# Patient Record
Sex: Female | Born: 1950 | Race: White | Hispanic: No | Smoking: Former smoker
Health system: Southern US, Community
[De-identification: ages and names within clinical notes are randomized; demographics above are authoritative.]

## PROBLEM LIST (undated history)

## (undated) DIAGNOSIS — Z87442 Personal history of urinary calculi: Secondary | ICD-10-CM

## (undated) DIAGNOSIS — E039 Hypothyroidism, unspecified: Secondary | ICD-10-CM

## (undated) DIAGNOSIS — E7800 Pure hypercholesterolemia, unspecified: Secondary | ICD-10-CM

## (undated) DIAGNOSIS — Z9289 Personal history of other medical treatment: Secondary | ICD-10-CM

## (undated) DIAGNOSIS — R112 Nausea with vomiting, unspecified: Secondary | ICD-10-CM

## (undated) DIAGNOSIS — Z9889 Other specified postprocedural states: Secondary | ICD-10-CM

## (undated) DIAGNOSIS — F419 Anxiety disorder, unspecified: Secondary | ICD-10-CM

## (undated) DIAGNOSIS — I4891 Unspecified atrial fibrillation: Secondary | ICD-10-CM

## (undated) HISTORY — PX: FOOT SURGERY: SHX648

## (undated) HISTORY — PX: KNEE ARTHROSCOPY W/ MENISCAL REPAIR: SHX1877

## (undated) HISTORY — PX: CHOLECYSTECTOMY: SHX55

## (undated) HISTORY — PX: APPENDECTOMY: SHX54

## (undated) HISTORY — PX: TUBAL LIGATION: SHX77

---

## 1973-09-30 DIAGNOSIS — Z9289 Personal history of other medical treatment: Secondary | ICD-10-CM

## 1973-09-30 HISTORY — DX: Personal history of other medical treatment: Z92.89

## 2001-11-19 ENCOUNTER — Encounter: Payer: Self-pay | Admitting: Emergency Medicine

## 2001-11-19 ENCOUNTER — Inpatient Hospital Stay (HOSPITAL_COMMUNITY): Admission: AD | Admit: 2001-11-19 | Discharge: 2001-11-20 | Payer: Self-pay | Admitting: Cardiovascular Disease

## 2001-11-19 ENCOUNTER — Encounter: Payer: Self-pay | Admitting: Cardiovascular Disease

## 2002-03-08 ENCOUNTER — Other Ambulatory Visit: Admission: RE | Admit: 2002-03-08 | Discharge: 2002-03-08 | Payer: Self-pay | Admitting: Dermatology

## 2002-03-09 ENCOUNTER — Encounter: Payer: Self-pay | Admitting: Emergency Medicine

## 2002-03-09 ENCOUNTER — Encounter: Payer: Self-pay | Admitting: Family Medicine

## 2002-03-09 ENCOUNTER — Observation Stay (HOSPITAL_COMMUNITY): Admission: EM | Admit: 2002-03-09 | Discharge: 2002-03-10 | Payer: Self-pay | Admitting: Emergency Medicine

## 2002-05-07 ENCOUNTER — Ambulatory Visit (HOSPITAL_COMMUNITY): Admission: RE | Admit: 2002-05-07 | Discharge: 2002-05-07 | Payer: Self-pay | Admitting: Family Medicine

## 2002-05-07 ENCOUNTER — Encounter: Payer: Self-pay | Admitting: Family Medicine

## 2003-05-06 ENCOUNTER — Encounter: Payer: Self-pay | Admitting: Family Medicine

## 2003-05-06 ENCOUNTER — Ambulatory Visit (HOSPITAL_COMMUNITY): Admission: RE | Admit: 2003-05-06 | Discharge: 2003-05-06 | Payer: Self-pay | Admitting: Family Medicine

## 2003-10-21 ENCOUNTER — Emergency Department (HOSPITAL_COMMUNITY): Admission: EM | Admit: 2003-10-21 | Discharge: 2003-10-22 | Payer: Self-pay | Admitting: Emergency Medicine

## 2003-11-10 ENCOUNTER — Ambulatory Visit (HOSPITAL_COMMUNITY): Admission: RE | Admit: 2003-11-10 | Discharge: 2003-11-10 | Payer: Self-pay | Admitting: Family Medicine

## 2004-07-26 ENCOUNTER — Ambulatory Visit (HOSPITAL_COMMUNITY): Admission: RE | Admit: 2004-07-26 | Discharge: 2004-07-26 | Payer: Self-pay | Admitting: Family Medicine

## 2004-09-24 ENCOUNTER — Ambulatory Visit (HOSPITAL_COMMUNITY): Admission: RE | Admit: 2004-09-24 | Discharge: 2004-09-24 | Payer: Self-pay | Admitting: Family Medicine

## 2004-11-14 ENCOUNTER — Ambulatory Visit (HOSPITAL_COMMUNITY): Admission: RE | Admit: 2004-11-14 | Discharge: 2004-11-14 | Payer: Self-pay | Admitting: Family Medicine

## 2005-08-16 ENCOUNTER — Ambulatory Visit (HOSPITAL_COMMUNITY): Admission: RE | Admit: 2005-08-16 | Discharge: 2005-08-16 | Payer: Self-pay | Admitting: Family Medicine

## 2006-07-01 ENCOUNTER — Ambulatory Visit (HOSPITAL_COMMUNITY): Admission: RE | Admit: 2006-07-01 | Discharge: 2006-07-01 | Payer: Self-pay | Admitting: Family Medicine

## 2006-12-08 ENCOUNTER — Ambulatory Visit (HOSPITAL_COMMUNITY): Admission: RE | Admit: 2006-12-08 | Discharge: 2006-12-08 | Payer: Self-pay | Admitting: Family Medicine

## 2007-03-26 ENCOUNTER — Emergency Department (HOSPITAL_COMMUNITY): Admission: EM | Admit: 2007-03-26 | Discharge: 2007-03-26 | Payer: Self-pay | Admitting: Emergency Medicine

## 2007-07-23 ENCOUNTER — Ambulatory Visit (HOSPITAL_COMMUNITY): Admission: RE | Admit: 2007-07-23 | Discharge: 2007-07-23 | Payer: Self-pay | Admitting: Family Medicine

## 2007-10-23 ENCOUNTER — Emergency Department (HOSPITAL_COMMUNITY): Admission: EM | Admit: 2007-10-23 | Discharge: 2007-10-23 | Payer: Self-pay | Admitting: Emergency Medicine

## 2007-12-18 ENCOUNTER — Emergency Department (HOSPITAL_COMMUNITY): Admission: EM | Admit: 2007-12-18 | Discharge: 2007-12-18 | Payer: Self-pay | Admitting: Emergency Medicine

## 2008-03-30 ENCOUNTER — Ambulatory Visit (HOSPITAL_COMMUNITY): Admission: RE | Admit: 2008-03-30 | Discharge: 2008-03-30 | Payer: Self-pay | Admitting: Family Medicine

## 2008-08-23 ENCOUNTER — Ambulatory Visit (HOSPITAL_COMMUNITY): Admission: RE | Admit: 2008-08-23 | Discharge: 2008-08-23 | Payer: Self-pay | Admitting: Family Medicine

## 2009-02-02 ENCOUNTER — Emergency Department (HOSPITAL_COMMUNITY): Admission: EM | Admit: 2009-02-02 | Discharge: 2009-02-02 | Payer: Self-pay | Admitting: Emergency Medicine

## 2009-08-28 ENCOUNTER — Ambulatory Visit (HOSPITAL_COMMUNITY): Admission: RE | Admit: 2009-08-28 | Discharge: 2009-08-28 | Payer: Self-pay | Admitting: Family Medicine

## 2010-07-20 ENCOUNTER — Ambulatory Visit: Payer: Self-pay | Admitting: Gastroenterology

## 2010-07-20 DIAGNOSIS — F411 Generalized anxiety disorder: Secondary | ICD-10-CM | POA: Insufficient documentation

## 2010-07-20 DIAGNOSIS — K921 Melena: Secondary | ICD-10-CM | POA: Insufficient documentation

## 2010-07-20 DIAGNOSIS — F419 Anxiety disorder, unspecified: Secondary | ICD-10-CM | POA: Insufficient documentation

## 2010-08-17 ENCOUNTER — Ambulatory Visit (HOSPITAL_COMMUNITY): Admission: RE | Admit: 2010-08-17 | Discharge: 2010-08-17 | Payer: Self-pay | Admitting: Gastroenterology

## 2010-08-17 ENCOUNTER — Ambulatory Visit: Payer: Self-pay | Admitting: Gastroenterology

## 2010-08-31 ENCOUNTER — Ambulatory Visit (HOSPITAL_COMMUNITY)
Admission: RE | Admit: 2010-08-31 | Discharge: 2010-08-31 | Payer: Self-pay | Source: Home / Self Care | Admitting: Family Medicine

## 2010-10-09 ENCOUNTER — Emergency Department (HOSPITAL_COMMUNITY)
Admission: EM | Admit: 2010-10-09 | Discharge: 2010-10-09 | Payer: Self-pay | Source: Home / Self Care | Admitting: Emergency Medicine

## 2010-10-15 LAB — BASIC METABOLIC PANEL
BUN: 24 mg/dL — ABNORMAL HIGH (ref 6–23)
CO2: 26 mEq/L (ref 19–32)
Calcium: 9.8 mg/dL (ref 8.4–10.5)
Chloride: 104 mEq/L (ref 96–112)
Creatinine, Ser: 0.95 mg/dL (ref 0.4–1.2)
GFR calc Af Amer: >60 mL/min (ref >60)
GFR calc non Af Amer: >60 mL/min (ref >60)
Glucose, Bld: 104 mg/dL — ABNORMAL HIGH (ref 70–99)
Potassium: 4 mEq/L (ref 3.5–5.1)
Sodium: 140 mEq/L (ref 135–145)

## 2010-10-15 LAB — DIFFERENTIAL
Basophils Absolute: 0 10*3/uL (ref 0.0–0.1)
Basophils Relative: 0 % (ref 0–1)
Eosinophils Absolute: 0.1 10*3/uL (ref 0.0–0.7)
Eosinophils Relative: 1 % (ref 0–5)
Lymphocytes Relative: 29 % (ref 12–46)
Lymphs Abs: 2.7 10*3/uL (ref 0.7–4.0)
Monocytes Absolute: 0.8 10*3/uL (ref 0.1–1.0)
Monocytes Relative: 8 % (ref 3–12)
Neutro Abs: 5.8 10*3/uL (ref 1.7–7.7)
Neutrophils Relative %: 62 % (ref 43–77)

## 2010-10-15 LAB — CBC
HCT: 39.5 % (ref 36.0–46.0)
Hemoglobin: 13.7 g/dL (ref 12.0–15.0)
MCH: 30.5 pg (ref 26.0–34.0)
MCHC: 34.7 g/dL (ref 30.0–36.0)
MCV: 88 fL (ref 78.0–100.0)
Platelets: 296 10*3/uL (ref 150–400)
RBC: 4.49 MIL/uL (ref 3.87–5.11)
RDW: 12.3 % (ref 11.5–15.5)
WBC: 9.3 10*3/uL (ref 4.0–10.5)

## 2010-10-15 LAB — D-DIMER, QUANTITATIVE: D-Dimer, Quant: 0.22 ug/mL-FEU (ref 0.00–0.48)

## 2010-10-15 LAB — POCT CARDIAC MARKERS
CKMB, poc: 1.4 ng/mL (ref 1.0–8.0)
Myoglobin, poc: 84.9 ng/mL (ref 12–200)
Troponin i, poc: <0.05 ng/mL (ref 0.00–0.09)

## 2010-10-21 ENCOUNTER — Encounter: Payer: Self-pay | Admitting: Family Medicine

## 2010-10-30 NOTE — Letter (Signed)
Summary: TCS ORDER  TCS ORDER   Imported By: Ave Filter 07/20/2010 11:34:42  _____________________________________________________________________  External Attachment:    Type:   Image     Comment:   External Document

## 2010-11-01 NOTE — Assessment & Plan Note (Signed)
Summary: COMP EXAM NEEDS TO TALK TO YOU REG COLONOSCOPY/LAW   Visit Type:  Initial Consult Referring Julie Miles:  Julie Miles Primary Care Julie Miles:  Julie Miles  Chief Complaint:  Discuss Colonoscopy.  History of Present Illness: Julie Miles is a pleasant 60 y/o WF, patient of Dr. Phillips Miles, who presents for to discuss colonoscopy. She has never had a TCS. Overall she feels well. Occasionally she has some loose stools related to certain foods. Recently had a little brbpr on toilet tissue. No constipation, melena, brbpr, abd pain. No heartburn, n/v, gerd, no weight loss.   Current Medications (verified): 1)  Simvastatin 40 Mg Tabs (Simvastatin) .... Take 1 Tablet By Mouth Once A Day 2)  Alprazolam 1 Mg Tabs (Alprazolam) .... Take 1 Tablet By Mouth Once A Day  Allergies (verified): No Known Drug Allergies  Past History:  Past Medical History: Osteoarthritis Hyperlipidemia Anxiety No prior tcs.  Past Surgical History: Appendectomy Cholecystectomy Tubal Ligation  Family History: No FH CRC, colon polyps, liver disease.  Social History: Works at [employer]. Divorced. Two children. Quit tob 5 years ago. No alcohol.  Review of Systems General:  Denies fever, chills, sweats, anorexia, fatigue, weakness, and weight loss. Eyes:  Denies vision loss. ENT:  Denies nasal congestion, sore throat, hoarseness, and difficulty swallowing. CV:  Denies chest pains, angina, palpitations, dyspnea on exertion, and peripheral edema. Resp:  Denies dyspnea at rest, dyspnea with exercise, cough, sputum, and wheezing. GI:  See HPI. GU:  Denies urinary burning and blood in urine. MS:  Denies joint pain / LOM. Derm:  Denies rash and itching. Neuro:  Denies weakness, frequent headaches, memory loss, and confusion. Psych:  Denies depression and anxiety. Endo:  Denies unusual weight change. Heme:  Denies bruising and bleeding. Allergy:  Denies hives and rash.  Vital Signs:  Patient profile:   60 year  old female Height:      67 inches Weight:      174 pounds BMI:     27.35 Temp:     98.1 degrees F oral Pulse rate:   64 / minute BP sitting:   140 / 72  (left arm) Cuff size:   regular  Vitals Entered By: Cloria Spring LPN (July 20, 2010 10:30 AM)  Physical Exam  General:  Well developed, well nourished, no acute distress. Head:  Normocephalic and atraumatic. Eyes:  Conjunctivae pink, no scleral icterus.  Mouth:  Oropharyngeal mucosa moist, pink.  No lesions, erythema or exudate.    Neck:  Supple; no masses or thyromegaly. Lungs:  Clear throughout to auscultation. Heart:  Regular rate and rhythm; no murmurs, rubs,  or bruits. Abdomen:  Bowel sounds normal.  Abdomen is soft, nontender, nondistended.  No rebound or guarding.  No hepatosplenomegaly, masses or hernias.  No abdominal bruits.  Rectal:  deferred until time of colonoscopy.   Extremities:  No clubbing, cyanosis, edema or deformities noted. Neurologic:  Alert and  oriented x4;  grossly normal neurologically. Skin:  Intact without significant lesions or rashes. Cervical Nodes:  No significant cervical adenopathy. Psych:  Alert and cooperative. Normal mood and affect.  Impression & Recommendations:  Problem # 1:  HEMATOCHEZIA (ICD-578.1)  Occasional brbpr. Occasional pp loose stool. Suspect benign anorectal source. Never had screening colonoscopy. Colonoscopy to be performed in near future.  Risks, alternatives, and benefits including but not limited to the risk of reaction to medication, bleeding, infection, and perforation were addressed.  Patient voiced understanding and provided verbal consent.   Orders: Consultation Level III [phone])  I would like to thank Dr. Phillips Miles for allowing [place] to take part in the care of this nice patient.  Appended Document: COMP EXAM NEEDS TO TALK TO YOU REG COLONOSCOPY/LAW SEP 2011 172 LBS

## 2011-01-08 LAB — DIFFERENTIAL
Basophils Absolute: 0 10*3/uL (ref 0.0–0.1)
Basophils Relative: 0 % (ref 0–1)
Eosinophils Absolute: 0 10*3/uL (ref 0.0–0.7)
Eosinophils Relative: 0 % (ref 0–5)
Monocytes Absolute: 1 10*3/uL (ref 0.1–1.0)
Neutro Abs: 11.1 10*3/uL — ABNORMAL HIGH (ref 1.7–7.7)

## 2011-01-08 LAB — COMPREHENSIVE METABOLIC PANEL
ALT: 33 U/L (ref 0–35)
AST: 31 U/L (ref 0–37)
Albumin: 3.9 g/dL (ref 3.5–5.2)
Alkaline Phosphatase: 56 U/L (ref 39–117)
BUN: 16 mg/dL (ref 6–23)
Chloride: 101 mEq/L (ref 96–112)
Potassium: 3.3 mEq/L — ABNORMAL LOW (ref 3.5–5.1)
Total Bilirubin: 0.9 mg/dL (ref 0.3–1.2)

## 2011-01-08 LAB — CBC
HCT: 36.1 % (ref 36.0–46.0)
Platelets: 209 10*3/uL (ref 150–400)
WBC: 12.9 10*3/uL — ABNORMAL HIGH (ref 4.0–10.5)

## 2011-01-08 LAB — URINALYSIS, ROUTINE W REFLEX MICROSCOPIC
Glucose, UA: NEGATIVE mg/dL
Ketones, ur: NEGATIVE mg/dL
Protein, ur: NEGATIVE mg/dL

## 2011-01-08 LAB — URINE MICROSCOPIC-ADD ON

## 2011-01-14 ENCOUNTER — Emergency Department (HOSPITAL_COMMUNITY)
Admission: EM | Admit: 2011-01-14 | Discharge: 2011-01-14 | Disposition: A | Discharge status: Home / Self Care | Payer: PRIVATE HEALTH INSURANCE | Attending: Emergency Medicine | Admitting: Emergency Medicine

## 2011-01-14 DIAGNOSIS — Z79899 Other long term (current) drug therapy: Secondary | ICD-10-CM | POA: Insufficient documentation

## 2011-01-14 DIAGNOSIS — E785 Hyperlipidemia, unspecified: Secondary | ICD-10-CM | POA: Insufficient documentation

## 2011-01-14 DIAGNOSIS — M79609 Pain in unspecified limb: Secondary | ICD-10-CM | POA: Insufficient documentation

## 2011-01-14 DIAGNOSIS — S9030XA Contusion of unspecified foot, initial encounter: Secondary | ICD-10-CM | POA: Insufficient documentation

## 2011-01-14 DIAGNOSIS — IMO0002 Reserved for concepts with insufficient information to code with codable children: Secondary | ICD-10-CM | POA: Insufficient documentation

## 2011-01-14 DIAGNOSIS — I10 Essential (primary) hypertension: Secondary | ICD-10-CM | POA: Insufficient documentation

## 2011-02-15 NOTE — Discharge Summary (Signed)
[place]  Patient:    NGUYEN, BUTLER Visit Number: [id number] MRN: [id number]          Service Type: MED Location: [id number] Attending Physician:  Virgina Evener Dictated by:   Raymon Mutton, P.A. Admit Date:  11/19/2001 Discharge Date: 11/20/2001   CC:         Dr. Mardi Mainland, [place]  Richard A. Alanda Amass, M.D.   Discharge Summary  DATE OF BIRTH:  02-May-1951.  ADMITTING PHYSICIAN:  Lennette Bihari, M.D.  DISCHARGING PHYSICIAN:  Pearletha Furl. Alanda Amass, M.D.  DISCHARGE DIAGNOSES: 1. Pneumonia, discharged home on antibiotic therapy. 2. Chest pain somewhat atypical, could be pleuritic, transferred to [facility] to rule out myocardial infarction, ruled out by negative    cardiac enzymes and no electrocardiogram changes. 3. Anxiety. 4. Hypokalemia of unknown etiology.  HISTORY OF PRESENT ILLNESS:  The patient is a 60 year old Caucasian woman with prior history of anxiety was admitted to [facility] with complaints of chest pain.  She awakened from sleep with chest pain and mild substernal pressure and mid upper epigastric pain.  She also complained of some shortness of breath and nausea but did not have any diaphoresis and did not have any vomiting.  She had a prior episode of the same dyspnea and chest discomfort several months ago but it resolved with lying down and resting.  This time the pain was getting worse with deep breathing.  The patient did not have any prior history of diabetes mellitus, hypertension or prior history of coronary artery disease but she is a smoker and has a positive family history of coronary artery disease.  The patient was transferred to [facility] on intravenous heparin and nitroglycerin for a possible catheterization to rule out ischemia.  HOSPITAL COURSE:  Upon assessment at [facility] the patient cardiac enzymes revealed normal values.  Her  electrocardiogram did not show any changes.  She complained mostly of right sided chest pain that was pleuritic in nature and her BMP showed low potassium of 3.3 and renal function was okay. The BUN was 7 and creatinine was 0.6, glucose 106. White blood cell count was elevated at 13.1, hemoglobin 12.4 and hematocrit 36.4.  Electrocardiogram revealed normal sinus rhythm and heart rate 71 per minute.  The chest x-ray showed mild changes of bronchitis either acute or chronic and biapical pleural and parenchymal scarring.  The patient was assessed in the morning by Dr. Alanda Amass and she was found to be stable from a cardiovascular standpoint.  She was discharged home on antibiotic and nonsteroidal anti-inflammatory drugs to ease up pleuritic chest pain.  DISCHARGE MEDICATIONS: 1. ______ 400 mg one p.o. daily for 10 days. 2. Vioxx 25 mg one p.o. daily p.r.n. pain. 3. Aspirin 81 mg p.o. q.d.  ACTIVITY:  As tolerated.  DISCHARGE DIET:  Low fat, low cholesterol, low salt diet.  SPECIAL INSTRUCTIONS:  The patient was advised to continue antibiotics as prescribed.  DISCHARGE FOLLOW UP: 1. Outpatient Cardiolite stress test will be on 12/03/2001 at 11:15. 2. Follow up with Dr. Laurena Slimmer in [facility] December 11, 2001 at 10:45 a.m.  OTHER DISCHARGE RECOMMENDATIONS:  Patient is to follow up the hypokalemia and scheduled patient for outpatient adrenal magnetic resonance imaging to rule out primary aldosteronism. Dictated by:   Raymon Mutton, P.A. Attending Physician:  Virgina Evener DD:  11/20/01 TD:  11/21/01 Job: [id number]

## 2011-02-15 NOTE — Consult Note (Signed)
[place]  Patient:    Julie Miles, Julie Miles Visit Number: [id number] MRN: [id number]          Service Type: MED Location: 2A A216 01 Attending Physician:  Colette Ribas Dictated by:   Tammy Presno, P.A. Proc. Date: 03/10/02 Admit Date:  03/09/2002 Discharge Date: 03/10/2002                            Consultation Report  HISTORY OF PRESENT ILLNESS:  This is a 60 year old, right-handed, Caucasian lady who was doing quite well up until this morning.  She did go to work, and mid morning at around 1 a.m., her supervisor noticed her leaning up against a wall.  It appeared that she did not feel well.  He did go and speak with her. They went to sit down and discuss what the possibilities of the problem were. They did feel that something was definitely wrong and called the paramedics. They were talking about her going to the hospital, and she evidently stood up and said she did not want to go to the hospital.  I believe she was getting ready to leave the room.  When she did stand up, she remained unaware for approximately 10 minutes.  I did speak with her supervisor this morning who witnessed this episode.  He does report that she did not have any seizure-like activity. She did not have any oral trauma or urinary incontinence.  There was nothing out of the ordinary according to the supervisor except that she simply collapsed.  There were no extenuating circumstances or prior warning that this was going to happen except for the patient stating she was not feeling well. She evidently was not specific about what she meant about that.  She has never had an episode like this before.  She did describe actually that she was feeling lightheaded and not as though things were spinning around.  She does report realizing that she was a little more tired yesterday than usual.  She did not have any slurred speech or visual disturbances.  She did have an episode, I  believe approximately a week ago, where she had a visual disturbance where her vision became black that lasted only a couple of seconds.  There have been no more incidents similar to this.  The patient has never had any seizures or strokes before.   The patient does have rare headaches.  She does not report snoring.  PAST MEDICAL HISTORY:  The patient does not have any significant or contributory past medical history.  CURRENT MEDICATIONS:  Xanax 0.5 p.r.n.  ALLERGIES:  CODEINE causes a GI upset.  FAMILY HISTORY:  Noncontributory.  SOCIAL HISTORY:  The patient is separated with two children.  She did begin smoking at age 74 and currently smokes a pack and one-half a day.  She does not use alcohol.  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  REVIEW OF SYSTEMS:  The patient does have insomnia from time to time.  She does not have reported apnea.  She does get tired quite a bit.  She does have a history of anemia.  PHYSICAL EXAMINATION:  GENERAL:  A very pleasant, weight appropriate lady in no acute distress.  VITAL SIGNS:  Orthostatics were checked on this patient as she did collapse just as she was standing up.  Her lying blood pressure was 104/55, pulse 66; standing 103/64, pulse  82.  NEUROLOGIC:  Mental status: The patient is alert and oriented.  Speech, language, and cognition are intact.  Pupils are equally round and reactive to light and accommodation.  Extraocular movements are intact.  There are no visual field deficits.  Facial muscle strength is normal and symmetric. Tongue and uvula are both midline.  Gag response is present.  Shoulder shrugs are normal.  Motor shows normal tone, bulk, and strength.  There is no pronator drift.  Sensory is normal to pinprick and temperature.  Reflexes are 2+ and symmetric.  Toes are downturning bilaterally.  Gait and tandem walking are normal.  IMPRESSION:  Syncopal episode, most likely not  neurologic in nature.  I am unable to detect any abnormalities with her neurologic exam.  Both transient ischemic attack and seizure would be considered in the differential.  PLAN:  We will see the patient if needed in our office or if she has further complications.  We would consider at that time doing an EEG as well. Dictated by:   Tammy Presno, P.A. Attending Physician:  Colette Ribas DD:  03/10/02 TD:  03/12/02 Job: [id number]

## 2011-02-15 NOTE — H&P (Signed)
[facility]  Patient:    Julie Miles, Julie Miles Visit Number: [id number] MRN: [id number]          Service Type: MED Location: 2A A216 01 Attending Physician:  Darlin Priestly Dictated by:   Colette Ribas, M.D. Admit Date:  03/09/2002 Discharge Date: 03/10/2002                           History and Physical  ADMITTING DIAGNOSIS:  Syncope.  HISTORY OF PRESENT ILLNESS:  A 60 year old female who just had a significant past medical history of anxiety, who presented to the emergency department, status post syncopal episode at work.  She was simply sitting down at work and felt light-headed, and "passed out for a few seconds."  Did not have a head injury.  Did not have any seizure-like activity.  Did not appear to be postictal after the episode.  Has been light-headed over the past preceding months with near syncopal episodes as well.  Was recently evaluated back in March with chest pain and near syncope by Dr. Domingo Sep with [facility].  She had a negative nuclear stress test at that time. Echocardiogram overall normal.  Stress test overall normal.  The episode today was not associated with any cardiovascular symptomatology such as chest pain or shortness of breath.  PAST MEDICAL HISTORY:  Anxiety.  PAST SURGICAL HISTORY:  BTL, cholecystectomy, appendectomy.  SOCIAL HISTORY:  She smokes a pack-a-day for many years.  No alcohol use. Works with [employer].  MEDICATIONS:  Xanax 0.5 mg p.r.n.  ALLERGIES:  CODEINE causing vomiting.  FAMILY HISTORY:  Significant for hypertension, some diabetes, and coronary artery disease.  PHYSICAL EXAMINATION:  VITAL SIGNS:  Temperature 97.1, pulse 78, respirations 20, and blood pressure 148/82.  GENERAL:  When I saw the patient, she was pleasant, talking, and in no acute distress.  HEENT:  Normocephalic and atraumatic.  Pupils are equal, round and reactive. Extraocular muscles intact.  Nasopharynx  and oropharynx clear.  NECK:  Supple with no lymphadenopathy or thyromegaly and no JVD.  CHEST:  Clear to auscultation bilaterally.  CARDIOVASCULAR:  Regular rate and rhythm with normal S1 and S2.  No S3 and no murmurs, gallops, or rubs.  ABDOMEN:  Soft, nontender, and nondistended.  No hepatosplenomegaly.  No masses.  EXTREMITIES:  No cyanosis, clubbing, or edema.  NEUROLOGIC:  Cranial nerves II-XII intact.  Strength equal bilaterally. Sensation intact.  LABORATORY DATA:  ECG showed normal sinus rhythm, rate in the 80s.  Normal axis and normal intervals with no acute ST changes.  Chest x-ray showed no acute changes.  Labs - CBC normal.  Chem-22 normal.  HCG negative.  CPK 101.  CK-MB 1.4. Troponin 0.03.  Drug screen pending.  Urinalysis negative except for a small amount of blood.  ASSESSMENT:  A 60 year old female with syncopal episode.  PLAN: 1. Admit to 2-A, telemetry. 2. Continue rule out protocol. 3. MRI. 4. Carotid Dopplers. 5. Reconsult Dr. Domingo Sep. 6. Consult neurology. 7. Will continue to follow. Dictated by:   Colette Ribas, M.D. Attending Physician:  Darlin Priestly DD:  03/09/02 TD:  03/11/02 Job: [id number]

## 2011-06-20 LAB — POCT CARDIAC MARKERS
CKMB, poc: 2
Operator id: 106841
Troponin i, poc: <0.05

## 2011-06-20 LAB — BASIC METABOLIC PANEL
BUN: 14
Calcium: 9.3
Creatinine, Ser: 1.08
GFR calc non Af Amer: 53 — ABNORMAL LOW
Glucose, Bld: 85

## 2011-06-20 LAB — CBC
MCHC: 34
Platelets: 303
RDW: 12.8

## 2011-06-20 LAB — DIFFERENTIAL
Basophils Absolute: 0.1
Basophils Relative: 1
Lymphocytes Relative: 41
Neutro Abs: 3.4

## 2011-06-24 LAB — BASIC METABOLIC PANEL
BUN: 15
Chloride: 107
Creatinine, Ser: 0.74
GFR calc non Af Amer: >60
Glucose, Bld: 104 — ABNORMAL HIGH

## 2011-06-24 LAB — DIFFERENTIAL
Basophils Absolute: 0
Basophils Relative: 0
Eosinophils Absolute: 0
Eosinophils Relative: 0
Lymphs Abs: 1.7
Neutrophils Relative %: 70

## 2011-06-24 LAB — CBC
HCT: 41.4
MCV: 88.2
Platelets: 288
RDW: 12.4
WBC: 7.3

## 2011-07-25 ENCOUNTER — Other Ambulatory Visit (HOSPITAL_COMMUNITY)
Admission: RE | Admit: 2011-07-25 | Discharge: 2011-07-25 | Disposition: A | Discharge status: Home / Self Care | Payer: BC Managed Care – PPO | Source: Ambulatory Visit | Attending: Obstetrics & Gynecology | Admitting: Obstetrics & Gynecology

## 2011-07-25 ENCOUNTER — Other Ambulatory Visit: Payer: Self-pay | Admitting: Obstetrics & Gynecology

## 2011-07-25 DIAGNOSIS — Z1159 Encounter for screening for other viral diseases: Secondary | ICD-10-CM | POA: Insufficient documentation

## 2011-07-30 ENCOUNTER — Other Ambulatory Visit (HOSPITAL_COMMUNITY): Payer: Self-pay | Admitting: Family Medicine

## 2011-07-30 DIAGNOSIS — Z139 Encounter for screening, unspecified: Secondary | ICD-10-CM

## 2011-09-05 ENCOUNTER — Ambulatory Visit (HOSPITAL_COMMUNITY): Admission: RE | Admit: 2011-09-05 | Payer: BC Managed Care – PPO | Source: Ambulatory Visit

## 2011-09-06 ENCOUNTER — Ambulatory Visit (HOSPITAL_COMMUNITY)
Admission: RE | Admit: 2011-09-06 | Discharge: 2011-09-06 | Disposition: A | Discharge status: Home / Self Care | Payer: BC Managed Care – PPO | Source: Ambulatory Visit | Attending: Family Medicine | Admitting: Family Medicine

## 2011-09-06 DIAGNOSIS — Z139 Encounter for screening, unspecified: Secondary | ICD-10-CM

## 2011-09-06 DIAGNOSIS — Z1231 Encounter for screening mammogram for malignant neoplasm of breast: Secondary | ICD-10-CM | POA: Insufficient documentation

## 2011-09-10 ENCOUNTER — Other Ambulatory Visit: Payer: Self-pay | Admitting: Family Medicine

## 2011-09-10 DIAGNOSIS — R928 Other abnormal and inconclusive findings on diagnostic imaging of breast: Secondary | ICD-10-CM

## 2011-09-18 ENCOUNTER — Ambulatory Visit (HOSPITAL_COMMUNITY): Payer: BC Managed Care – PPO

## 2011-09-25 ENCOUNTER — Ambulatory Visit (HOSPITAL_COMMUNITY)
Admission: RE | Admit: 2011-09-25 | Discharge: 2011-09-25 | Disposition: A | Discharge status: Home / Self Care | Payer: BC Managed Care – PPO | Source: Ambulatory Visit | Attending: Family Medicine | Admitting: Family Medicine

## 2011-09-25 DIAGNOSIS — N63 Unspecified lump in unspecified breast: Secondary | ICD-10-CM | POA: Insufficient documentation

## 2011-09-25 DIAGNOSIS — R928 Other abnormal and inconclusive findings on diagnostic imaging of breast: Secondary | ICD-10-CM

## 2011-10-25 ENCOUNTER — Encounter (HOSPITAL_COMMUNITY): Payer: Self-pay

## 2011-10-25 ENCOUNTER — Emergency Department (HOSPITAL_COMMUNITY)
Admission: EM | Admit: 2011-10-25 | Discharge: 2011-10-25 | Disposition: A | Discharge status: Home / Self Care | Payer: BC Managed Care – PPO | Attending: Emergency Medicine | Admitting: Emergency Medicine

## 2011-10-25 DIAGNOSIS — F411 Generalized anxiety disorder: Secondary | ICD-10-CM | POA: Insufficient documentation

## 2011-10-25 DIAGNOSIS — R109 Unspecified abdominal pain: Secondary | ICD-10-CM | POA: Insufficient documentation

## 2011-10-25 DIAGNOSIS — J3489 Other specified disorders of nose and nasal sinuses: Secondary | ICD-10-CM | POA: Insufficient documentation

## 2011-10-25 DIAGNOSIS — T368X1A Poisoning by other systemic antibiotics, accidental (unintentional), initial encounter: Secondary | ICD-10-CM | POA: Insufficient documentation

## 2011-10-25 DIAGNOSIS — Z79899 Other long term (current) drug therapy: Secondary | ICD-10-CM | POA: Insufficient documentation

## 2011-10-25 DIAGNOSIS — T3691XA Poisoning by unspecified systemic antibiotic, accidental (unintentional), initial encounter: Secondary | ICD-10-CM | POA: Insufficient documentation

## 2011-10-25 DIAGNOSIS — E7800 Pure hypercholesterolemia, unspecified: Secondary | ICD-10-CM | POA: Insufficient documentation

## 2011-10-25 DIAGNOSIS — T50901A Poisoning by unspecified drugs, medicaments and biological substances, accidental (unintentional), initial encounter: Secondary | ICD-10-CM

## 2011-10-25 DIAGNOSIS — R110 Nausea: Secondary | ICD-10-CM | POA: Insufficient documentation

## 2011-10-25 HISTORY — DX: Anxiety disorder, unspecified: F41.9

## 2011-10-25 HISTORY — DX: Pure hypercholesterolemia, unspecified: E78.00

## 2011-10-25 MED ORDER — ONDANSETRON 8 MG PO TBDP: 8.0000 mg | ORAL_TABLET | Freq: Three times a day (TID) | ORAL | Status: AC | PRN

## 2011-10-25 MED ORDER — ONDANSETRON 8 MG PO TBDP
8.0000 mg | ORAL_TABLET | Freq: Once | ORAL | Status: AC
  Administered 2011-10-25: 8 mg via ORAL
  Filled 2011-10-25: qty 1

## 2011-10-25 NOTE — ED Provider Notes (Signed)
History     CSN: [id number]  Arrival date & time 10/25/11  2030   First MD Initiated Contact with Patient 10/25/11 2059      Chief Complaint  Patient presents with  . Drug Overdose  . Nausea    (Consider location/radiation/quality/duration/timing/severity/associated sxs/prior treatment) Patient is a 61 y.o. female presenting with Overdose. The history is provided by the patient.  Drug Overdose This is a new problem. The current episode started 6 to 12 hours ago. The problem has been gradually worsening. Associated symptoms include abdominal pain. Pertinent negatives include no chest pain, no headaches and no shortness of breath. The symptoms are aggravated by nothing. The symptoms are relieved by nothing.   Patient took extra Levaquin that was prescribed to her yesterday by Dr. Phillips Odor for sinus infection to try to get better quicker over the weekend because she's been suffering with upper respiratory sinus type symptoms for several days has been out of work. Patient took one tablet at 2230 yesterday took 2 tablets at 0 6:30 today and then 2 this evening resulting in nausea and abdominal discomfort but not true pain no vomiting patient feels likes she needs to vomit but unable to. No diarrhea no chest pain no shortness of breath. Denies any other ingestions denies any suicidal ideation or suicidal intention.  Past Medical History  Diagnosis Date  . Anxiety   . Hypercholesterolemia     History reviewed. No pertinent past surgical history.  No family history on file.  History  Substance Use Topics  . Smoking status: Former Games developer  . Smokeless tobacco: Not on file  . Alcohol Use: No    OB History    Grav Para Term Preterm Abortions TAB SAB Ect Mult Living                  Review of Systems  Constitutional: Negative for fever and chills.  HENT: Positive for congestion and sinus pressure. Negative for neck stiffness.   Eyes: Negative for redness and visual disturbance.    Respiratory: Negative for cough and shortness of breath.   Cardiovascular: Negative for chest pain.  Gastrointestinal: Positive for nausea and abdominal pain. Negative for vomiting and diarrhea.  Genitourinary: Negative for dysuria.  Musculoskeletal: Negative for back pain.  Neurological: Negative for dizziness, seizures, syncope, weakness and headaches.  Hematological: Does not bruise/bleed easily.    Allergies  Codeine  Home Medications   Current Outpatient Rx  Name Route Sig Dispense Refill  . ALPRAZOLAM 1 MG PO TABS Oral Take 1 mg by mouth at bedtime as needed. For sleep    . LEVOFLOXACIN 500 MG PO TABS Oral Take 500 mg by mouth daily. For 10 days    . NYQUIL PO Oral Take by mouth as needed. For cold symptoms    . SIMVASTATIN PO Oral Take 1 tablet by mouth daily.    Marland Kitchen ONDANSETRON 8 MG PO TBDP Oral Take 1 tablet (8 mg total) by mouth every 8 (eight) hours as needed for nausea. 10 tablet 0    BP 164/80  Pulse 96  Temp(Src) 97.5 F (36.4 C) (Oral)  Resp 16  Ht 5\' 6"  (1.676 m)  Wt 177 lb (80.287 kg)  BMI 28.57 kg/m2  SpO2 99%  Physical Exam  Nursing note and vitals reviewed. Constitutional: She is oriented to person, place, and time. She appears well-developed and well-nourished. No distress.  HENT:  Head: Normocephalic and atraumatic.  Mouth/Throat: Oropharynx is clear and moist.  Eyes: Conjunctivae and EOM  are normal. Pupils are equal, round, and reactive to light.  Neck: Normal range of motion. Neck supple.  Cardiovascular: Normal rate, regular rhythm and normal heart sounds.   No murmur heard. Pulmonary/Chest: Effort normal and breath sounds normal. No respiratory distress.  Abdominal: Soft. Bowel sounds are normal. There is no tenderness.  Musculoskeletal: Normal range of motion. She exhibits no edema.  Neurological: She is alert and oriented to person, place, and time. No cranial nerve deficit. She exhibits normal muscle tone. Coordination normal.  Skin: Skin is  warm. No rash noted. No erythema.    ED Course  Procedures (including critical care time)  Labs Reviewed - No data to display No results found.   1. Overdose       MDM   Accidental overdose or unintentional harmful overdose of Levaquin patient's been suffering from sinus infection so her primary care doctor yesterday and was prescribed Levaquin for it patient took 3 extra Levaquin, 1 at 2230 yesterday 11/[birth year]1928 today and then 2 this evening. Patient adamantly denies suicidal ideation. She states she was trying to get better faster she was tired of the sinus infection still being present she was experiencing sinus symptoms several days prior to starting the antibiotic. Patient after taking the medication this evening started with nausea feels as if she needs to vomit but she has not no diarrhea no chest pain no shortness of breath no rash.  Patient improved significantly in the ED with Zofran.        Shelda Jakes, MD 10/25/11 2157

## 2011-10-25 NOTE — ED Notes (Addendum)
Pt presents with  nausea after taking 3 extra levaquin. Pt took 1 at 2230 yesterday, 2 at 0630 today, and 2 this evening. Pt tearful in triage.

## 2011-10-25 NOTE — ED Notes (Addendum)
Patient states she received a prescription for levaquin yesterday morning for sinusitis. States she has taken more pills than she was supposed to because she wants to feel better. Is supposed to take one pill per day. Patient states she took one pill yesterday morning, one pill last night, one pill this morning, and one pill this evening. States she feels very nauseated and like she needs to vomit. States she cannot vomit and wants something to make her vomit. States she has heaved so much that her ribs are hurting at this time.

## 2012-06-04 ENCOUNTER — Encounter (HOSPITAL_COMMUNITY): Payer: Self-pay | Admitting: Pharmacist

## 2012-06-15 ENCOUNTER — Encounter (HOSPITAL_COMMUNITY)
Admission: RE | Admit: 2012-06-15 | Discharge: 2012-06-15 | Disposition: A | Discharge status: Home / Self Care | Payer: BC Managed Care – PPO | Source: Ambulatory Visit | Attending: Obstetrics and Gynecology | Admitting: Obstetrics and Gynecology

## 2012-06-15 ENCOUNTER — Encounter (HOSPITAL_COMMUNITY): Payer: Self-pay

## 2012-06-15 HISTORY — DX: Personal history of other medical treatment: Z92.89

## 2012-06-15 HISTORY — DX: Other specified postprocedural states: Z98.890

## 2012-06-15 HISTORY — DX: Other specified postprocedural states: R11.2

## 2012-06-15 LAB — CBC
Hemoglobin: 13.4 g/dL (ref 12.0–15.0)
MCH: 29.9 pg (ref 26.0–34.0)
MCHC: 32.9 g/dL (ref 30.0–36.0)
Platelets: 251 10*3/uL (ref 150–400)
RDW: 12.7 % (ref 11.5–15.5)

## 2012-06-15 LAB — SURGICAL PCR SCREEN
MRSA, PCR: NEGATIVE
Staphylococcus aureus: NEGATIVE

## 2012-06-15 NOTE — Patient Instructions (Addendum)
Your procedure is scheduled on:06/24/12  Enter through the Main Entrance at :0945 Pick up desk phone and dial [phone] and inform us of your arrival.  Please call [phone] if you have any problems the morning of surgery.  Remember: Do not eat after midnight:Tuesday Do not drink after:7am Wed  Take these meds the morning of surgery with a sip of water:none  DO NOT wear jewelry, eye make-up, lipstick,body lotion, or dark fingernail polish. Do not shave for 48 hours prior to surgery.  If you are to be admitted after surgery, leave suitcase in car until your room has been assigned. Patients discharged on the day of surgery will not be allowed to drive home.   Remember to use your Hibiclens as instructed.

## 2012-06-19 ENCOUNTER — Other Ambulatory Visit: Payer: Self-pay | Admitting: Obstetrics and Gynecology

## 2012-06-23 NOTE — H&P (Signed)
61 y.o. yo complains of SUI with SA, cystocele and LLQ pain.  Pt had LLQ pain 3x bad enough to send her home from work.  Sharp but did not have bowel sx or bleeding.  It has not happened since but she also has a symptomatic cystocele and has trouble making sure her bladder is empty- she often has to go back after voiding.  She leaks a lot during sex and sometimes leaks at night.  She has only occasional urge sx.  We decided to hold cystometrics since she has clear signs of cystocele and SUI.  She does not splint.  Past Medical History  Diagnosis Date  . Anxiety   . Hypercholesterolemia   . PONV (postoperative nausea and vomiting)   . History of blood transfusion 1975    at [facility] after vag delivery   Past Surgical History  Procedure Date  . Appendectomy   . Tubal ligation   . Cholecystectomy   . Foot surgery     History   Social History  . Marital Status: Divorced    Spouse Name: N/A    Number of Children: N/A  . Years of Education: N/A   Occupational History  . Not on file.   Social History Main Topics  . Smoking status: Former Games developer  . Smokeless tobacco: Not on file  . Alcohol Use: No  . Drug Use: No  . Sexually Active:    Other Topics Concern  . Not on file   Social History Narrative  . No narrative on file    No current facility-administered medications on file prior to encounter.   Current Outpatient Prescriptions on File Prior to Encounter  Medication Sig Dispense Refill  . ALPRAZolam (XANAX) 1 MG tablet Take 1 mg by mouth at bedtime as needed. For sleep      . SIMVASTATIN PO Take 40 mg by mouth daily.         Allergies  Allergen Reactions  . Codeine Nausea And Vomiting    @VITALS2 @  Lungs: clear to ascultation Cor:  RRR Abdomen:  soft, nontender, nondistended. Ex:  no cords, erythema Pelvic:  NEFG, small uterus, cystocele 0, rectocele -2, no masses.  U/S  7x3x3, em 2.4 mm, 1.2 fibroid, ovaries not seen, no masses or free  fluid.  A:  For dx scope secondary LLQ pain.  If adhesions or visible disease, will proceed with Robo TLH/poss BSO.  If uterus is mobile and free of disease, will then proceed with TVH followed by TVH/A repair/Cysto.     P:  All risks, benefits and alternatives d/w patient and she desires to proceed.  Patient has undergone a modified bowel prep and will receive preop antibiotics and SCDs during the operation.     Kaspar Albornoz A

## 2012-06-24 ENCOUNTER — Encounter (HOSPITAL_COMMUNITY): Payer: Self-pay | Admitting: Anesthesiology

## 2012-06-24 ENCOUNTER — Encounter (HOSPITAL_COMMUNITY): Payer: Self-pay | Admitting: *Deleted

## 2012-06-24 ENCOUNTER — Ambulatory Visit (HOSPITAL_COMMUNITY)
Admission: RE | Admit: 2012-06-24 | Discharge: 2012-06-25 | Disposition: A | Discharge status: Home / Self Care | Payer: BC Managed Care – PPO | Source: Ambulatory Visit | Attending: Obstetrics and Gynecology | Admitting: Obstetrics and Gynecology

## 2012-06-24 ENCOUNTER — Encounter (HOSPITAL_COMMUNITY)
Admission: RE | Disposition: A | Discharge status: Home / Self Care | Payer: Self-pay | Source: Ambulatory Visit | Attending: Obstetrics and Gynecology

## 2012-06-24 ENCOUNTER — Ambulatory Visit (HOSPITAL_COMMUNITY): Payer: BC Managed Care – PPO | Admitting: Anesthesiology

## 2012-06-24 DIAGNOSIS — R1032 Left lower quadrant pain: Secondary | ICD-10-CM | POA: Insufficient documentation

## 2012-06-24 DIAGNOSIS — N8111 Cystocele, midline: Secondary | ICD-10-CM | POA: Insufficient documentation

## 2012-06-24 DIAGNOSIS — Z9889 Other specified postprocedural states: Secondary | ICD-10-CM

## 2012-06-24 DIAGNOSIS — D251 Intramural leiomyoma of uterus: Secondary | ICD-10-CM | POA: Insufficient documentation

## 2012-06-24 DIAGNOSIS — N393 Stress incontinence (female) (male): Secondary | ICD-10-CM | POA: Insufficient documentation

## 2012-06-24 HISTORY — PX: LAPAROSCOPY: SHX197

## 2012-06-24 HISTORY — PX: ANTERIOR AND POSTERIOR REPAIR: SHX5121

## 2012-06-24 HISTORY — PX: VAGINAL HYSTERECTOMY: SHX2639

## 2012-06-24 HISTORY — PX: CYSTOSCOPY: SHX5120

## 2012-06-24 HISTORY — PX: BLADDER SUSPENSION: SHX72

## 2012-06-24 SURGERY — LAPAROSCOPY OPERATIVE
Anesthesia: General | Site: Vagina | Wound class: Clean Contaminated

## 2012-06-24 MED ORDER — MIDAZOLAM HCL 5 MG/5ML IJ SOLN
INTRAMUSCULAR | Status: DC | PRN
  Administered 2012-06-24: 2 mg via INTRAVENOUS

## 2012-06-24 MED ORDER — ONDANSETRON HCL 4 MG/2ML IJ SOLN
INTRAMUSCULAR | Status: DC | PRN
  Administered 2012-06-24: 4 mg via INTRAVENOUS

## 2012-06-24 MED ORDER — GLYCOPYRROLATE 0.2 MG/ML IJ SOLN
INTRAMUSCULAR | Status: AC
  Filled 2012-06-24: qty 3

## 2012-06-24 MED ORDER — FENTANYL CITRATE 0.05 MG/ML IJ SOLN
INTRAMUSCULAR | Status: AC
  Filled 2012-06-24: qty 2

## 2012-06-24 MED ORDER — NEOSTIGMINE METHYLSULFATE 1 MG/ML IJ SOLN
INTRAMUSCULAR | Status: AC
  Filled 2012-06-24: qty 10

## 2012-06-24 MED ORDER — STERILE WATER FOR IRRIGATION IR SOLN
Status: DC | PRN
  Administered 2012-06-24: 1000 mL via INTRAVESICAL

## 2012-06-24 MED ORDER — DEXAMETHASONE SODIUM PHOSPHATE 4 MG/ML IJ SOLN
INTRAMUSCULAR | Status: DC | PRN
  Administered 2012-06-24: 10 mg via INTRAVENOUS

## 2012-06-24 MED ORDER — PROPOFOL 10 MG/ML IV EMUL
INTRAVENOUS | Status: DC | PRN
  Administered 2012-06-24: 150 mg via INTRAVENOUS

## 2012-06-24 MED ORDER — HYDROMORPHONE HCL PF 1 MG/ML IJ SOLN: 0.2500 mg | INTRAMUSCULAR | Status: DC | PRN

## 2012-06-24 MED ORDER — CEFAZOLIN SODIUM-DEXTROSE 2-3 GM-% IV SOLR
INTRAVENOUS | Status: AC
  Filled 2012-06-24: qty 50

## 2012-06-24 MED ORDER — LACTATED RINGERS IV SOLN
INTRAVENOUS | Status: DC
  Administered 2012-06-24 (×4): via INTRAVENOUS

## 2012-06-24 MED ORDER — LIDOCAINE-EPINEPHRINE 0.5 %-1:200000 IJ SOLN
INTRAMUSCULAR | Status: DC | PRN
  Administered 2012-06-24: 10 mL

## 2012-06-24 MED ORDER — ESTRADIOL 0.1 MG/GM VA CREA
TOPICAL_CREAM | VAGINAL | Status: DC | PRN
  Administered 2012-06-24: 1 via VAGINAL

## 2012-06-24 MED ORDER — FENTANYL CITRATE 0.05 MG/ML IJ SOLN
INTRAMUSCULAR | Status: AC
  Filled 2012-06-24: qty 5

## 2012-06-24 MED ORDER — ROCURONIUM BROMIDE 50 MG/5ML IV SOLN
INTRAVENOUS | Status: AC
  Filled 2012-06-24: qty 1

## 2012-06-24 MED ORDER — ESTRADIOL 0.1 MG/GM VA CREA
TOPICAL_CREAM | VAGINAL | Status: AC
  Filled 2012-06-24: qty 42.5

## 2012-06-24 MED ORDER — KETOROLAC TROMETHAMINE 30 MG/ML IJ SOLN: 30.0000 mg | Freq: Four times a day (QID) | INTRAMUSCULAR | Status: DC

## 2012-06-24 MED ORDER — OXYCODONE-ACETAMINOPHEN 5-325 MG PO TABS: 1.0000 | ORAL_TABLET | ORAL | Status: DC | PRN

## 2012-06-24 MED ORDER — ACETAMINOPHEN 10 MG/ML IV SOLN
INTRAVENOUS | Status: AC
  Filled 2012-06-24: qty 100

## 2012-06-24 MED ORDER — MENTHOL 3 MG MT LOZG
1.0000 | LOZENGE | OROMUCOSAL | Status: DC | PRN
  Administered 2012-06-25: 3 mg via ORAL
  Filled 2012-06-24: qty 9

## 2012-06-24 MED ORDER — SCOPOLAMINE 1 MG/3DAYS TD PT72
MEDICATED_PATCH | TRANSDERMAL | Status: AC
  Administered 2012-06-24: 1.5 mg via TRANSDERMAL
  Filled 2012-06-24: qty 1

## 2012-06-24 MED ORDER — NEOSTIGMINE METHYLSULFATE 1 MG/ML IJ SOLN
INTRAMUSCULAR | Status: DC | PRN
  Administered 2012-06-24: 2 mg via INTRAVENOUS

## 2012-06-24 MED ORDER — LIDOCAINE HCL (CARDIAC) 20 MG/ML IV SOLN
INTRAVENOUS | Status: AC
  Filled 2012-06-24: qty 5

## 2012-06-24 MED ORDER — ALPRAZOLAM 0.5 MG PO TABS: 1.0000 mg | ORAL_TABLET | Freq: Every evening | ORAL | Status: DC | PRN

## 2012-06-24 MED ORDER — ACETAMINOPHEN 10 MG/ML IV SOLN
1000.0000 mg | Freq: Once | INTRAVENOUS | Status: AC
  Administered 2012-06-24: 1000 mg via INTRAVENOUS

## 2012-06-24 MED ORDER — FENTANYL CITRATE 0.05 MG/ML IJ SOLN
INTRAMUSCULAR | Status: DC | PRN
  Administered 2012-06-24 (×2): 50 ug via INTRAVENOUS
  Administered 2012-06-24: 100 ug via INTRAVENOUS
  Administered 2012-06-24: 50 ug via INTRAVENOUS

## 2012-06-24 MED ORDER — SIMVASTATIN 40 MG PO TABS
40.0000 mg | ORAL_TABLET | Freq: Every day | ORAL | Status: DC
  Filled 2012-06-24: qty 1

## 2012-06-24 MED ORDER — KETOROLAC TROMETHAMINE 30 MG/ML IJ SOLN: 15.0000 mg | Freq: Once | INTRAMUSCULAR | Status: DC | PRN

## 2012-06-24 MED ORDER — ROCURONIUM BROMIDE 100 MG/10ML IV SOLN
INTRAVENOUS | Status: DC | PRN
  Administered 2012-06-24: 45 mg via INTRAVENOUS
  Administered 2012-06-24: 5 mg via INTRAVENOUS

## 2012-06-24 MED ORDER — ONDANSETRON HCL 4 MG/2ML IJ SOLN: 4.0000 mg | Freq: Four times a day (QID) | INTRAMUSCULAR | Status: DC | PRN

## 2012-06-24 MED ORDER — MIDAZOLAM HCL 2 MG/2ML IJ SOLN
INTRAMUSCULAR | Status: AC
  Filled 2012-06-24: qty 2

## 2012-06-24 MED ORDER — DEXAMETHASONE SODIUM PHOSPHATE 10 MG/ML IJ SOLN
INTRAMUSCULAR | Status: AC
  Filled 2012-06-24: qty 1

## 2012-06-24 MED ORDER — INDIGOTINDISULFONATE SODIUM 8 MG/ML IJ SOLN
INTRAMUSCULAR | Status: AC
  Filled 2012-06-24: qty 5

## 2012-06-24 MED ORDER — ONDANSETRON HCL 4 MG PO TABS: 4.0000 mg | ORAL_TABLET | Freq: Four times a day (QID) | ORAL | Status: DC | PRN

## 2012-06-24 MED ORDER — ONDANSETRON HCL 4 MG/2ML IJ SOLN
INTRAMUSCULAR | Status: AC
  Filled 2012-06-24: qty 2

## 2012-06-24 MED ORDER — PROPOFOL 10 MG/ML IV EMUL
INTRAVENOUS | Status: AC
  Filled 2012-06-24: qty 20

## 2012-06-24 MED ORDER — PHENAZOPYRIDINE HCL 100 MG PO TABS: 200.0000 mg | ORAL_TABLET | Freq: Three times a day (TID) | ORAL | Status: DC

## 2012-06-24 MED ORDER — LIDOCAINE HCL (CARDIAC) 20 MG/ML IV SOLN
INTRAVENOUS | Status: DC | PRN
  Administered 2012-06-24: 80 mg via INTRAVENOUS
  Administered 2012-06-24: 20 mg via INTRAVENOUS

## 2012-06-24 MED ORDER — GLYCOPYRROLATE 0.2 MG/ML IJ SOLN
INTRAMUSCULAR | Status: DC | PRN
  Administered 2012-06-24: 0.4 mg via INTRAVENOUS
  Administered 2012-06-24: 0.2 mg via INTRAVENOUS

## 2012-06-24 MED ORDER — CEFAZOLIN SODIUM-DEXTROSE 2-3 GM-% IV SOLR: 2.0000 g | INTRAVENOUS | Status: DC

## 2012-06-24 MED ORDER — KETOROLAC TROMETHAMINE 60 MG/2ML IM SOLN
INTRAMUSCULAR | Status: AC
  Filled 2012-06-24: qty 2

## 2012-06-24 MED ORDER — LIDOCAINE-EPINEPHRINE (PF) 1 %-1:200000 IJ SOLN
INTRAMUSCULAR | Status: AC
  Filled 2012-06-24: qty 10

## 2012-06-24 MED ORDER — SCOPOLAMINE 1 MG/3DAYS TD PT72
1.0000 | MEDICATED_PATCH | TRANSDERMAL | Status: DC
  Administered 2012-06-24: 1.5 mg via TRANSDERMAL

## 2012-06-24 MED ORDER — INDIGOTINDISULFONATE SODIUM 8 MG/ML IJ SOLN
INTRAMUSCULAR | Status: DC | PRN
  Administered 2012-06-24: 5 mL via INTRAVENOUS

## 2012-06-24 MED ORDER — KETOROLAC TROMETHAMINE 30 MG/ML IJ SOLN
INTRAMUSCULAR | Status: DC | PRN
  Administered 2012-06-24: 60 mg via INTRAVENOUS

## 2012-06-24 MED ORDER — IBUPROFEN 800 MG PO TABS: 800.0000 mg | ORAL_TABLET | Freq: Three times a day (TID) | ORAL | Status: DC | PRN

## 2012-06-24 MED ORDER — ZOLPIDEM TARTRATE 5 MG PO TABS: 5.0000 mg | ORAL_TABLET | Freq: Every evening | ORAL | Status: DC | PRN

## 2012-06-24 SURGICAL SUPPLY — 83 items
ADH SKN CLS APL DERMABOND .7 (GAUZE/BANDAGES/DRESSINGS) ×4
BAG URINE DRAINAGE (UROLOGICAL SUPPLIES) ×5 IMPLANT
BLADE SURG 15 STRL LF C SS BP (BLADE) ×5 IMPLANT
BLADE SURG 15 STRL SS (BLADE) ×10
CABLE HIGH FREQUENCY MONO STRZ (ELECTRODE) ×5 IMPLANT
CANISTER SUCTION 2500CC (MISCELLANEOUS) ×5 IMPLANT
CATH BONANNO SUPRAPUBIC 14G (CATHETERS) IMPLANT
CATH FOLEY 2WAY SLVR  5CC 14FR (CATHETERS) ×1
CATH FOLEY 2WAY SLVR 5CC 14FR (CATHETERS) ×1 IMPLANT
CHLORAPREP W/TINT 26ML (MISCELLANEOUS) ×5 IMPLANT
CLOTH BEACON ORANGE TIMEOUT ST (SAFETY) ×5 IMPLANT
CONT PATH 16OZ SNAP LID 3702 (MISCELLANEOUS) ×5 IMPLANT
COVER MAYO STAND STRL (DRAPES) ×5 IMPLANT
COVER TABLE BACK 60X90 (DRAPES) ×12 IMPLANT
DECANTER SPIKE VIAL GLASS SM (MISCELLANEOUS) ×5 IMPLANT
DERMABOND ADVANCED (GAUZE/BANDAGES/DRESSINGS) ×1
DERMABOND ADVANCED .7 DNX12 (GAUZE/BANDAGES/DRESSINGS) ×4 IMPLANT
DRAPE HYSTEROSCOPY (DRAPE) ×5 IMPLANT
DRAPE PROXIMA HALF (DRAPES) ×6 IMPLANT
DRAPE WARM FLUID 44X44 (DRAPE) ×5 IMPLANT
ELECT REM PT RETURN 9FT ADLT (ELECTROSURGICAL) ×5
ELECTRODE REM PT RTRN 9FT ADLT (ELECTROSURGICAL) ×4 IMPLANT
FORMULA 20CAL 3 OZ MEAD (FORMULA) ×2 IMPLANT
GAUZE PACKING 1 X5 YD ST (GAUZE/BANDAGES/DRESSINGS) IMPLANT
GAUZE PACKING 2X5 YD STERILE (GAUZE/BANDAGES/DRESSINGS) ×3 IMPLANT
GAUZE SPONGE 4X4 16PLY XRAY LF (GAUZE/BANDAGES/DRESSINGS) ×2 IMPLANT
GAUZE VASELINE 3X9 (GAUZE/BANDAGES/DRESSINGS) IMPLANT
GLOVE BIO SURGEON STRL SZ 6.5 (GLOVE) ×9 IMPLANT
GLOVE BIO SURGEON STRL SZ7 (GLOVE) ×17 IMPLANT
GLOVE BIOGEL PI IND STRL 6.5 (GLOVE) ×5 IMPLANT
GLOVE BIOGEL PI IND STRL 7.5 (GLOVE) ×1 IMPLANT
GLOVE BIOGEL PI INDICATOR 6.5 (GLOVE) ×2
GLOVE BIOGEL PI INDICATOR 7.5 (GLOVE) ×1
GLOVE ECLIPSE 6.5 STRL STRAW (GLOVE) ×15 IMPLANT
GLOVE ECLIPSE 7.0 STRL STRAW (GLOVE) ×4 IMPLANT
GLOVE ECLIPSE 7.5 STRL STRAW (GLOVE) ×2 IMPLANT
GOWN BRE IMP PREV XXLGXLNG (GOWN DISPOSABLE) ×4 IMPLANT
GOWN STRL REIN XL XLG (GOWN DISPOSABLE) ×38 IMPLANT
NDL INSUFFLATION 14GA 120MM (NEEDLE) ×3 IMPLANT
NDL SPNL 22GX3.5 QUINCKE BK (NEEDLE) IMPLANT
NEEDLE HYPO 22GX1.5 SAFETY (NEEDLE) ×7 IMPLANT
NEEDLE INSUFFLATION 14GA 120MM (NEEDLE) ×5 IMPLANT
NEEDLE SPNL 22GX3.5 QUINCKE BK (NEEDLE) ×5 IMPLANT
NS IRRIG 1000ML POUR BTL (IV SOLUTION) ×7 IMPLANT
PACK LAPAROSCOPY BASIN (CUSTOM PROCEDURE TRAY) ×5 IMPLANT
PACK LAVH (CUSTOM PROCEDURE TRAY) ×5 IMPLANT
PACK VAGINAL WOMENS (CUSTOM PROCEDURE TRAY) ×5 IMPLANT
PAD OB MATERNITY 4.3X12.25 (PERSONAL CARE ITEMS) ×5 IMPLANT
PAD PREP 24X48 CUFFED NSTRL (MISCELLANEOUS) ×10 IMPLANT
PLUG CATH AND CAP STER (CATHETERS) ×5 IMPLANT
PROTECTOR NERVE ULNAR (MISCELLANEOUS) ×10 IMPLANT
SET CYSTO W/LG BORE CLAMP LF (SET/KITS/TRAYS/PACK) ×5 IMPLANT
SLEEVE SCD COMPRESS KNEE MED (MISCELLANEOUS) ×2 IMPLANT
SLING TRANS VAGINAL TAPE (Sling) ×1 IMPLANT
SLING UTERINE/ABD GYNECARE TVT (Sling) ×7 IMPLANT
STRIP CLOSURE SKIN 1/2X4 (GAUZE/BANDAGES/DRESSINGS) ×3 IMPLANT
SURGIFLO W/THROMBIN 8M KIT (HEMOSTASIS) ×2 IMPLANT
SUT SILK 3 0 SH CR/8 (SUTURE) IMPLANT
SUT VIC AB 0 CT1 18XCR BRD8 (SUTURE) ×12 IMPLANT
SUT VIC AB 0 CT1 27 (SUTURE) ×15
SUT VIC AB 0 CT1 27XBRD ANBCTR (SUTURE) ×18 IMPLANT
SUT VIC AB 0 CT1 8-18 (SUTURE) ×15
SUT VIC AB 2-0 CT1 27 (SUTURE) ×10
SUT VIC AB 2-0 CT1 TAPERPNT 27 (SUTURE) ×14 IMPLANT
SUT VIC AB 2-0 SH 27 (SUTURE)
SUT VIC AB 2-0 SH 27XBRD (SUTURE) IMPLANT
SUT VIC AB 2-0 UR6 27 (SUTURE) ×2 IMPLANT
SUT VICRYL 0 TIES 12 18 (SUTURE) ×5 IMPLANT
SUT VICRYL 0 UR6 27IN ABS (SUTURE) ×10 IMPLANT
SUT VICRYL RAPIDE 3 0 (SUTURE) ×10 IMPLANT
SYR 50ML LL SCALE MARK (SYRINGE) ×5 IMPLANT
SYR 5ML LL (SYRINGE) ×5 IMPLANT
SYR BULB IRRIGATION 50ML (SYRINGE) ×2 IMPLANT
TOWEL OR 17X24 6PK STRL BLUE (TOWEL DISPOSABLE) ×12 IMPLANT
TRAY FOLEY CATH 14FR (SET/KITS/TRAYS/PACK) ×5 IMPLANT
TROCAR XCEL 12X100 BLDLESS (ENDOMECHANICALS) ×3 IMPLANT
TROCAR XCEL NON-BLD 5MMX100MML (ENDOMECHANICALS) ×6 IMPLANT
TROCAR Z-THREAD 12X150 (TROCAR) ×2 IMPLANT
TROCAR Z-THREAD BLADED 11X100M (TROCAR) ×3 IMPLANT
TUBING FILTER THERMOFLATOR (ELECTROSURGICAL) ×5 IMPLANT
TUBING SCD EXPRESS 7FT (MISCELLANEOUS) ×2 IMPLANT
WARMER LAPAROSCOPE (MISCELLANEOUS) ×5 IMPLANT
WATER STERILE IRR 1000ML POUR (IV SOLUTION) ×15 IMPLANT

## 2012-06-24 NOTE — Anesthesia Postprocedure Evaluation (Signed)
  Anesthesia Post-op Note  Patient: Julie Miles  Procedure(s) Performed: Procedure(s) (LRB) with comments: LAPAROSCOPY OPERATIVE (N/A) HYSTERECTOMY VAGINAL (N/A) TRANSVAGINAL TAPE (TVT) PROCEDURE (N/A) ANTERIOR (CYSTOCELE) AND POSTERIOR REPAIR (RECTOCELE) (N/A) - anterior repair CYSTOSCOPY (N/A) Patient is awake and responsive. Pain and nausea are reasonably well controlled. Vital signs are stable and clinically acceptable. Oxygen saturation is clinically acceptable. There are no apparent anesthetic complications at this time. Patient is ready for discharge.

## 2012-06-24 NOTE — Anesthesia Preprocedure Evaluation (Signed)
Anesthesia Evaluation  Patient identified by MRN, date of birth, ID band Patient awake    Reviewed: Allergy & Precautions, H&P , NPO status , Patient's Chart, lab work & pertinent test results, reviewed documented beta blocker date and time   History of Anesthesia Complications (+) PONV  Airway Mallampati: II TM Distance: >3 FB Neck ROM: full    Dental  (+) Partial Upper   Pulmonary neg pulmonary ROS, former smoker (quit 8 years ago),  breath sounds clear to auscultation  Pulmonary exam normal       Cardiovascular Exercise Tolerance: Good negative cardio ROS  Rhythm:regular Rate:Normal     Neuro/Psych PSYCHIATRIC DISORDERS (anxiety) negative neurological ROS     GI/Hepatic negative GI ROS, Neg liver ROS,   Endo/Other  negative endocrine ROS  Renal/GU negative Renal ROS Bladder dysfunction Female GU complaint     Musculoskeletal   Abdominal   Peds  Hematology H/o blood transfusion in 1975   Anesthesia Other Findings   Reproductive/Obstetrics negative OB ROS                           Anesthesia Physical Anesthesia Plan  ASA: II  Anesthesia Plan: General ETT   Post-op Pain Management:    Induction:   Airway Management Planned:   Additional Equipment:   Intra-op Plan:   Post-operative Plan:   Informed Consent: I have reviewed the patients History and Physical, chart, labs and discussed the procedure including the risks, benefits and alternatives for the proposed anesthesia with the patient or authorized representative who has indicated his/her understanding and acceptance.   Dental Advisory Given  Plan Discussed with: CRNA and Surgeon  Anesthesia Plan Comments:         Anesthesia Quick Evaluation

## 2012-06-24 NOTE — Preoperative (Signed)
Beta Blockers   Reason not to administer Beta Blockers:Not Applicable 

## 2012-06-24 NOTE — Progress Notes (Signed)
Patient is eating, ambulating, not voiding- cath in.  Pain control is good.  Feels some urinary urgency with cath in place.  BP 137/59  Pulse 61  Temp 97.5 F (36.4 C) (Oral)  Resp 16  SpO2 99%  lungs:   clear to auscultation cor:    RRR Abdomen:  soft, appropriate tenderness, incisions intact and without erythema or exudate. ex:    no cords   Lab Results  Component Value Date   WBC 5.1 06/15/2012   HGB 13.4 06/15/2012   HCT 40.7 06/15/2012   MCV 90.8 06/15/2012   PLT 251 06/15/2012    A/P  Routine care.  Expect d/c per plan.  Pyridum for urgency.

## 2012-06-24 NOTE — Progress Notes (Signed)
There has been no change in the patients history, status or exam since the history and physical.  Filed Vitals:   06/24/12 0859  BP: 133/58  Pulse: 64  Temp: 98.1 F (36.7 C)  TempSrc: Oral  Resp: 18  SpO2: 98%    Lab Results  Component Value Date   WBC 5.1 06/15/2012   HGB 13.4 06/15/2012   HCT 40.7 06/15/2012   MCV 90.8 06/15/2012   PLT 251 06/15/2012    Carnetta Losada A

## 2012-06-24 NOTE — Brief Op Note (Signed)
06/24/2012  1:20 PM  PATIENT:  Julie Miles  61 y.o. female  PRE-OPERATIVE DIAGNOSIS:  Sudden Urinary Incontinence  POST-OPERATIVE DIAGNOSIS:  Sudden Urinary Incontinence  PROCEDURE:  Procedure(s) (LRB) with comments: LAPAROSCOPY OPERATIVE (N/A) HYSTERECTOMY VAGINAL (N/A) TRANSVAGINAL TAPE (TVT) PROCEDURE (N/A) ANTERIOR (CYSTOCELE) AND POSTERIOR REPAIR (RECTOCELE) (N/A) - anterior repair CYSTOSCOPY (N/A)  SURGEON:  Surgeon(s) and Role:    * Lashica Hannay A Brayah Urquilla, MD - Primary    * W Scott Bowie, MD - Assisting  ANESTHESIA:   general  EBL:  Total I/O In: 1000 [I.V.:1000] Out: 300 [Urine:100; Blood:200]  LOCAL MEDICATIONS USED:  LIDOCAINE   SPECIMEN:  Source of Specimen:  uterus, cervix  DISPOSITION OF SPECIMEN:  PATHOLOGY  COUNTS:  YES  DICTATION: .Note written in EPIC  PLAN OF CARE: Admit for overnight observation  PATIENT DISPOSITION:  PACU - hemodynamically stable.   Delay start of Pharmacological VTE agent (>24hrs) due to surgical blood loss or risk of bleeding: not applicable  Complications:  None.  Findings:  cystocele to 0, 7 week size uterus and normal ovaries.  Small adhesion of the L tube to the bowel epiploica and small adhesion of the R tube to peritoneum on the side.  Needles were not seen in the bladder and good spill bilaterally was seen from both ureteral orifices.    Technique:  After adequate general anesthesia was achieved, the patient was prepped and draped in sterile fashion.  The speculum was placed into the vagina and the uterine manipulator placed in the cervix. The speculum was removed and a foley placed to drain the bladder during the surgery.  Attention was turned to the abdomen and a  2 cm incision was made above the umbilicus.  The veress needle passed into the abdomen without aspiration of bowel contents or blood.  The 12 mm trocar for the camera port was introduced after insuflatation and the above findings noted.  A 5 mm trocar was  placed above the bladder under direct visualization.  The small adhesion of the L tube to the epiploica of the bowel was cut with cold shears- it was thick but only 2 mm in width and no bowel was in the field of dissection.  The small adhesion of the R tube to the peritoneal sidewall was taken down with hot shears.  Attention was then turned to the vagina.  The bladder was emptied with a red rubber catheter and 60 cc of Similac was placed in the bladder. The cervix was grasped with a pair of Lahey clamps and injected circumferentially with 1% lidocaine with epi. A circumferential incision was made around the cervix with the scalpel at the level of the reflection of the vagina onto the cervix and the posterior cul-de-sac was entered into with Mayo scissors. The long billed duckbill retractor was then placed and the bladder was removed off the cervix carefully with sharp dissection with the Metzenbaums. The the uterosacrals were grasped with a pair heney clamps on either side and secured with a Heaney stitch of 0 Vicryl. Cardinal ligament was then divided with alternating successive bites of the Heaney clamp followed by incision with the Mayo scissors and secured with stitches of 0 Vicryl at the level of the cornua Heaneys were placed bilaterally around the entire pedicle and the uterus was able to be amputated. The pedicles were secured with a free hand stitch of 0 Vicryl followed by a stitch of 0 Vicryl bilaterally. Once hemostasis was achieved the peritoneum was closed in   a purses string fashion including the bilateral uterosacrals and posteriorly in and out of the vagina and a partial Halbans culdoplasty.  The stitch was pulled closing the peritoneum and pulling the uterosacrals together through the modified Halbans and through the vagina.   The anterior vaginal mucosa was then entered into in the midline with the help of Alis's after being injected with 1% lidocaine with epi with the scalpel. The vaginal  mucosa was then reflected off of the vesicouterine fascia with careful sharp dissection with the Metzenbaums until the pelvic floor could feel be felt underneath the pubic bone. 2 small stab incisions are made on 2 cm either side of the midline just above the pubic bone. The abdominal needles were placed carefully through the pelvic floor and out the vagina. The Foley was removed and the cystoscope placed inside the bladder.  There were no needles in the bladder and there was good spillage of indigo carmine from the bilateral ureteral orifices. The abdominal needles were attached the vaginal needles and the tape pulled through and cut into place. A Kelly was used to ensure that the there was no tension placed on the tape. Once I was satisfied that the sling was in the correct place and at no tension, the anterior repair was done with 2 mattress stitches of 0 Vicryl. There was some oozing from the areas where the needles had been placed through the pelvic floor and hemostasis was achieved with Surgiflo. The vaginal mucosa was trimmed and closed with a running locked stitch of 2-0 Vicryl. The cuff was then closed with interrupted figure-of-eight stitches of 0 Vicryl. A vaginal pack was placed inside the vagina with Estrace cream and the Foley had been replaced patient tolerated the procedure was returned to recovery in stable condition.  All instruments were removed and the abdomen desuflated.  The 12 mm trocar site fascia was closed with a figure of eight stitch of 2-Vicryl.  All skin incisions were closed with subcuticular stitches and Dermabond.  All instruments were withdrawn from the vagina.  Pt tolerated the procedure well and was returned to the recovery room in stable condition.    Clester Chlebowski A   

## 2012-06-24 NOTE — Op Note (Signed)
06/24/2012  1:20 PM  PATIENT:  Julie Miles  61 y.o. female  PRE-OPERATIVE DIAGNOSIS:  Sudden Urinary Incontinence  POST-OPERATIVE DIAGNOSIS:  Sudden Urinary Incontinence  PROCEDURE:  Procedure(s) (LRB) with comments: LAPAROSCOPY OPERATIVE (N/A) HYSTERECTOMY VAGINAL (N/A) TRANSVAGINAL TAPE (TVT) PROCEDURE (N/A) ANTERIOR (CYSTOCELE) AND POSTERIOR REPAIR (RECTOCELE) (N/A) - anterior repair CYSTOSCOPY (N/A)  SURGEON:  Surgeon(s) and Role:    * Loney Laurence, MD - Primary    * W Lodema Hong, MD - Assisting  ANESTHESIA:   general  EBL:  Total I/O In: 1000 [I.V.:1000] Out: 300 [Urine:100; Blood:200]  LOCAL MEDICATIONS USED:  LIDOCAINE   SPECIMEN:  Source of Specimen:  uterus, cervix  DISPOSITION OF SPECIMEN:  PATHOLOGY  COUNTS:  YES  DICTATION: .Note written in EPIC  PLAN OF CARE: Admit for overnight observation  PATIENT DISPOSITION:  PACU - hemodynamically stable.   Delay start of Pharmacological VTE agent (>24hrs) due to surgical blood loss or risk of bleeding: not applicable  Complications:  None.  Findings:  cystocele to 0, 7 week size uterus and normal ovaries.  Small adhesion of the L tube to the bowel epiploica and small adhesion of the R tube to peritoneum on the side.  Needles were not seen in the bladder and good spill bilaterally was seen from both ureteral orifices.    Technique:  After adequate general anesthesia was achieved, the patient was prepped and draped in sterile fashion.  The speculum was placed into the vagina and the uterine manipulator placed in the cervix. The speculum was removed and a foley placed to drain the bladder during the surgery.  Attention was turned to the abdomen and a  2 cm incision was made above the umbilicus.  The veress needle passed into the abdomen without aspiration of bowel contents or blood.  The 12 mm trocar for the camera port was introduced after insuflatation and the above findings noted.  A 5 mm trocar was  placed above the bladder under direct visualization.  The small adhesion of the L tube to the epiploica of the bowel was cut with cold shears- it was thick but only 2 mm in width and no bowel was in the field of dissection.  The small adhesion of the R tube to the peritoneal sidewall was taken down with hot shears.  Attention was then turned to the vagina.  The bladder was emptied with a red rubber catheter and 60 cc of Similac was placed in the bladder. The cervix was grasped with a pair of Lahey clamps and injected circumferentially with 1% lidocaine with epi. A circumferential incision was made around the cervix with the scalpel at the level of the reflection of the vagina onto the cervix and the posterior cul-de-sac was entered into with Mayo scissors. The long billed duckbill retractor was then placed and the bladder was removed off the cervix carefully with sharp dissection with the Metzenbaums. The the uterosacrals were grasped with a pair heney clamps on either side and secured with a Heaney stitch of 0 Vicryl. Cardinal ligament was then divided with alternating successive bites of the Heaney clamp followed by incision with the Mayo scissors and secured with stitches of 0 Vicryl at the level of the cornua Heaneys were placed bilaterally around the entire pedicle and the uterus was able to be amputated. The pedicles were secured with a free hand stitch of 0 Vicryl followed by a stitch of 0 Vicryl bilaterally. Once hemostasis was achieved the peritoneum was closed in  a purses string fashion including the bilateral uterosacrals and posteriorly in and out of the vagina and a partial Halbans culdoplasty.  The stitch was pulled closing the peritoneum and pulling the uterosacrals together through the modified Halbans and through the vagina.   The anterior vaginal mucosa was then entered into in the midline with the help of Alis's after being injected with 1% lidocaine with epi with the scalpel. The vaginal  mucosa was then reflected off of the vesicouterine fascia with careful sharp dissection with the Metzenbaums until the pelvic floor could feel be felt underneath the pubic bone. 2 small stab incisions are made on 2 cm either side of the midline just above the pubic bone. The abdominal needles were placed carefully through the pelvic floor and out the vagina. The Foley was removed and the cystoscope placed inside the bladder.  There were no needles in the bladder and there was good spillage of indigo carmine from the bilateral ureteral orifices. The abdominal needles were attached the vaginal needles and the tape pulled through and cut into place. A Tresa Endo was used to ensure that the there was no tension placed on the tape. Once I was satisfied that the sling was in the correct place and at no tension, the anterior repair was done with 2 mattress stitches of 0 Vicryl. There was some oozing from the areas where the needles had been placed through the pelvic floor and hemostasis was achieved with Surgiflo. The vaginal mucosa was trimmed and closed with a running locked stitch of 2-0 Vicryl. The cuff was then closed with interrupted figure-of-eight stitches of 0 Vicryl. A vaginal pack was placed inside the vagina with Estrace cream and the Foley had been replaced patient tolerated the procedure was returned to recovery in stable condition.  All instruments were removed and the abdomen desuflated.  The 12 mm trocar site fascia was closed with a figure of eight stitch of 2-Vicryl.  All skin incisions were closed with subcuticular stitches and Dermabond.  All instruments were withdrawn from the vagina.  Pt tolerated the procedure well and was returned to the recovery room in stable condition.    Kwasi Joung A

## 2012-06-24 NOTE — Transfer of Care (Signed)
Immediate Anesthesia Transfer of Care Note  Patient: Julie Miles  Procedure(s) Performed: Procedure(s) (LRB) with comments: LAPAROSCOPY OPERATIVE (N/A) HYSTERECTOMY VAGINAL (N/A) TRANSVAGINAL TAPE (TVT) PROCEDURE (N/A) ANTERIOR (CYSTOCELE) AND POSTERIOR REPAIR (RECTOCELE) (N/A) - anterior repair CYSTOSCOPY (N/A)  Patient Location: PACU  Anesthesia Type: General  Level of Consciousness: awake, oriented and patient cooperative  Airway & Oxygen Therapy: Patient Spontanous Breathing and Patient connected to nasal cannula oxygen  Post-op Assessment: Report given to PACU RN and Post -op Vital signs reviewed and stable  Post vital signs: Reviewed and stable  Complications:

## 2012-06-25 MED ORDER — PHENAZOPYRIDINE HCL 200 MG PO TABS: 200.0000 mg | ORAL_TABLET | Freq: Three times a day (TID) | ORAL | Status: DC

## 2012-06-25 MED ORDER — OXYCODONE-ACETAMINOPHEN 5-325 MG PO TABS: 1.0000 | ORAL_TABLET | ORAL | Status: DC | PRN

## 2012-06-25 MED ORDER — CEFUROXIME AXETIL 250 MG PO TABS: 250.0000 mg | ORAL_TABLET | Freq: Two times a day (BID) | ORAL | Status: DC

## 2012-06-25 NOTE — Discharge Summary (Signed)
Physician Discharge Summary  Patient ID: MAKALEY STORTS MRN: [id number] DOB/AGE: Jun 19, 1951 61 y.o.  Admit date: 06/24/2012 Discharge date: 06/25/2012  Admission Diagnoses:SUI, cystocele, LLQ pain  Discharge Diagnoses: Same Active Problems:  * No active hospital problems. *    Discharged Condition: good  Hospital Course: Uncomplicated surgery on 09-25.  D/Ced to home with foley on POD 1.  Consults: None  Significant Diagnostic Studies: none.  Treatments: surgery: Dx scope with LOA, TVH, TVT, A repair, cysto  Discharge Exam: Blood pressure 111/66, pulse 61, temperature 98.2 F (36.8 C), temperature source Oral, resp. rate 18, SpO2 96.00%.   Disposition: 01-Home or Self Care  Discharge Orders    Future Orders Please Complete By Expires   Diet - low sodium heart healthy      Discharge instructions      Comments:   No driving on narcotics, no sexual activity for 2 weeks.   Increase activity slowly      May shower / Bathe      Comments:   Shower, no bath for 2 weeks.   Sexual Activity Restrictions      Comments:   No sexual activity for 2 weeks.   Remove dressing in 24 hours      Call MD for:  temperature >100.4          Medication List     As of 06/25/2012  7:48 AM    TAKE these medications         ALPRAZolam 1 MG tablet   Commonly known as: XANAX   Take 1 mg by mouth at bedtime as needed. For sleep      cefUROXime 250 MG tablet   Commonly known as: CEFTIN   Take 1 tablet (250 mg total) by mouth 2 (two) times daily.      oxyCODONE-acetaminophen 5-325 MG per tablet   Commonly known as: PERCOCET/ROXICET   Take 1-2 tablets by mouth every 4 (four) hours as needed (moderate to severe pain (when tolerating fluids)).      phenazopyridine 200 MG tablet   Commonly known as: PYRIDIUM   Take 1 tablet (200 mg total) by mouth 3 (three) times daily with meals.      SIMVASTATIN PO   Take 40 mg by mouth daily.           Follow-up Information    Follow up  with Babara Buffalo A, MD. In 4 days. (Voidng trial at office on Monday; RTC at 2 weeks postop for incision check)    Contact information:   [address] [place] [address] [phone]          Signed: Monifah Freehling A 06/25/2012, 7:48 AM

## 2012-06-25 NOTE — Progress Notes (Signed)
Patient is eating, ambulating, not voiding- foley in.  Pain control is good.  BP 111/66  Pulse 61  Temp 98.2 F (36.8 C) (Oral)  Resp 18  SpO2 96%  lungs:   clear to auscultation cor:    RRR Abdomen:  soft, appropriate tenderness, incisions intact and without erythema or exudate. ex:    no cords   Lab Results  Component Value Date   WBC 5.1 06/15/2012   HGB 13.4 06/15/2012   HCT 40.7 06/15/2012   MCV 90.8 06/15/2012   PLT 251 06/15/2012    A/P  Routine care.  Expect d/c per plan.  Ceftin at home with cath. Voiding trial on Monday.

## 2012-06-25 NOTE — Progress Notes (Signed)
Vaginal pack was removed without problem- only scant old blood.

## 2012-06-25 NOTE — Discharge Planning (Signed)
Reviewed use of Foley, and leg bag. Changed to leg bag for ride home while utilizing "teach back".  Pt verbalizes and demonstrates understanding of  Catheter use.  Foley bag and necessary items sent home c pt in "pt belonging bag".  Pt verbalizes understanding of all d/c instructions, prescriptions given.  MT Clearence Cheek.

## 2012-06-26 ENCOUNTER — Encounter (HOSPITAL_COMMUNITY): Payer: Self-pay | Admitting: Obstetrics and Gynecology

## 2012-07-21 MED ORDER — CEFAZOLIN SODIUM-DEXTROSE 2-3 GM-% IV SOLR
INTRAVENOUS | Status: DC | PRN
  Administered 2012-06-24: 2 g via INTRAVENOUS

## 2012-07-21 NOTE — Addendum Note (Signed)
Addendum  created 07/21/12 1428 by Collier Flowers, CRNA   Modules edited:Anesthesia Medication Administration

## 2012-08-14 ENCOUNTER — Other Ambulatory Visit (HOSPITAL_COMMUNITY): Payer: Self-pay | Admitting: Family Medicine

## 2012-08-14 DIAGNOSIS — Z139 Encounter for screening, unspecified: Secondary | ICD-10-CM

## 2012-09-08 ENCOUNTER — Ambulatory Visit (HOSPITAL_COMMUNITY): Payer: BC Managed Care – PPO

## 2012-09-17 ENCOUNTER — Ambulatory Visit (HOSPITAL_COMMUNITY): Payer: BC Managed Care – PPO

## 2012-09-28 ENCOUNTER — Ambulatory Visit (HOSPITAL_COMMUNITY)
Admission: RE | Admit: 2012-09-28 | Discharge: 2012-09-28 | Disposition: A | Discharge status: Home / Self Care | Payer: BC Managed Care – PPO | Source: Ambulatory Visit | Attending: Family Medicine | Admitting: Family Medicine

## 2012-09-28 DIAGNOSIS — Z139 Encounter for screening, unspecified: Secondary | ICD-10-CM

## 2012-09-28 DIAGNOSIS — Z1231 Encounter for screening mammogram for malignant neoplasm of breast: Secondary | ICD-10-CM | POA: Insufficient documentation

## 2012-09-29 ENCOUNTER — Ambulatory Visit (HOSPITAL_COMMUNITY): Payer: BC Managed Care – PPO

## 2012-11-20 ENCOUNTER — Ambulatory Visit: Payer: BC Managed Care – PPO | Admitting: Urology

## 2012-12-18 ENCOUNTER — Ambulatory Visit: Payer: BC Managed Care – PPO | Admitting: Urology

## 2013-09-06 ENCOUNTER — Other Ambulatory Visit (HOSPITAL_COMMUNITY): Payer: Self-pay | Admitting: Family Medicine

## 2013-09-06 DIAGNOSIS — Z139 Encounter for screening, unspecified: Secondary | ICD-10-CM

## 2013-10-08 ENCOUNTER — Ambulatory Visit (HOSPITAL_COMMUNITY)
Admission: RE | Admit: 2013-10-08 | Discharge: 2013-10-08 | Disposition: A | Discharge status: Home / Self Care | Payer: BC Managed Care – PPO | Source: Ambulatory Visit | Attending: Family Medicine | Admitting: Family Medicine

## 2013-10-08 DIAGNOSIS — Z139 Encounter for screening, unspecified: Secondary | ICD-10-CM

## 2013-10-08 DIAGNOSIS — Z1231 Encounter for screening mammogram for malignant neoplasm of breast: Secondary | ICD-10-CM | POA: Insufficient documentation

## 2014-10-11 ENCOUNTER — Telehealth: Payer: Self-pay

## 2014-10-11 NOTE — Telephone Encounter (Signed)
PLEASE CALL PT.SHE HAD HYPERPLASTIC POLYPS REMOVED IN NOV 2011.  IN LIGHT OF HER BROTHER'S DIAGNOSIS, HER NEXT TCS WOULD BE DUE NOV 2016 AND THEN EVERY  5 YRS. IF SHE IS HAVING A CHANGE IN BOWEL HABITS, RECTAL BLEEDING, OR WEIGHT LOSS ASSOCIATED WITH A CHANGE IN HER BOWEL HABITS, SHE CAN WAIT UNTIL NOV 2016.

## 2014-10-11 NOTE — Telephone Encounter (Signed)
Pt called and said Dr. Hilma Favors wanted her to have a colonoscopy again soon. Her last one was 07/2010 by Dr. Oneida Alar and her next recommended in 2021 and she is on recall. Since then, about one and a half years ago, she had a half brother die at age 63 of colon cancer. ( it was her mom's son).  She is very anxious to know what she should do. Please advise!

## 2014-10-11 NOTE — Telephone Encounter (Signed)
Patient received letter from DS to be triaged. Please call her back at [phone] or [phone]

## 2014-10-12 NOTE — Telephone Encounter (Signed)
I called and informed pt. She is not having any problems at this time. She is aware she will be on the recall for 08/2015 now instead of 07/2020.   Routing to Manuela Schwartz to change the recall from 07/2020 to 08/2015.

## 2014-10-13 NOTE — Telephone Encounter (Signed)
Reminder in epic °

## 2014-11-10 ENCOUNTER — Encounter (HOSPITAL_COMMUNITY): Payer: Self-pay | Admitting: Emergency Medicine

## 2014-11-10 ENCOUNTER — Emergency Department (HOSPITAL_COMMUNITY)
Admission: EM | Admit: 2014-11-10 | Discharge: 2014-11-10 | Disposition: A | Discharge status: Home / Self Care | Payer: 59 | Attending: Emergency Medicine | Admitting: Emergency Medicine

## 2014-11-10 ENCOUNTER — Emergency Department (HOSPITAL_COMMUNITY): Payer: 59

## 2014-11-10 ENCOUNTER — Other Ambulatory Visit: Payer: Self-pay

## 2014-11-10 ENCOUNTER — Ambulatory Visit (HOSPITAL_COMMUNITY)
Admission: RE | Admit: 2014-11-10 | Discharge: 2014-11-10 | Disposition: A | Discharge status: Home / Self Care | Payer: 59 | Source: Ambulatory Visit | Attending: Family Medicine | Admitting: Family Medicine

## 2014-11-10 ENCOUNTER — Other Ambulatory Visit (HOSPITAL_COMMUNITY): Payer: Self-pay | Admitting: Family Medicine

## 2014-11-10 DIAGNOSIS — F319 Bipolar disorder, unspecified: Secondary | ICD-10-CM | POA: Insufficient documentation

## 2014-11-10 DIAGNOSIS — Z79899 Other long term (current) drug therapy: Secondary | ICD-10-CM | POA: Insufficient documentation

## 2014-11-10 DIAGNOSIS — R5383 Other fatigue: Secondary | ICD-10-CM

## 2014-11-10 DIAGNOSIS — J209 Acute bronchitis, unspecified: Secondary | ICD-10-CM

## 2014-11-10 DIAGNOSIS — R0989 Other specified symptoms and signs involving the circulatory and respiratory systems: Secondary | ICD-10-CM | POA: Insufficient documentation

## 2014-11-10 DIAGNOSIS — Z87891 Personal history of nicotine dependence: Secondary | ICD-10-CM | POA: Insufficient documentation

## 2014-11-10 DIAGNOSIS — F329 Major depressive disorder, single episode, unspecified: Secondary | ICD-10-CM

## 2014-11-10 DIAGNOSIS — R05 Cough: Secondary | ICD-10-CM | POA: Diagnosis not present

## 2014-11-10 DIAGNOSIS — F419 Anxiety disorder, unspecified: Secondary | ICD-10-CM | POA: Insufficient documentation

## 2014-11-10 DIAGNOSIS — E780 Pure hypercholesterolemia: Secondary | ICD-10-CM | POA: Insufficient documentation

## 2014-11-10 LAB — CBC WITH DIFFERENTIAL/PLATELET
BASOS ABS: 0 10*3/uL (ref 0.0–0.1)
Basophils Relative: 0 % (ref 0–1)
Eosinophils Absolute: 0 10*3/uL (ref 0.0–0.7)
Eosinophils Relative: 1 % (ref 0–5)
HEMATOCRIT: 45.3 % (ref 36.0–46.0)
HEMOGLOBIN: 15.2 g/dL — AB (ref 12.0–15.0)
LYMPHS PCT: 31 % (ref 12–46)
Lymphs Abs: 2.7 10*3/uL (ref 0.7–4.0)
MCH: 30.2 pg (ref 26.0–34.0)
MCHC: 33.6 g/dL (ref 30.0–36.0)
MCV: 89.9 fL (ref 78.0–100.0)
MONO ABS: 0.9 10*3/uL (ref 0.1–1.0)
MONOS PCT: 10 % (ref 3–12)
NEUTROS ABS: 5.2 10*3/uL (ref 1.7–7.7)
NEUTROS PCT: 58 % (ref 43–77)
Platelets: 325 10*3/uL (ref 150–400)
RBC: 5.04 MIL/uL (ref 3.87–5.11)
RDW: 12.6 % (ref 11.5–15.5)
WBC: 8.8 10*3/uL (ref 4.0–10.5)

## 2014-11-10 LAB — URINALYSIS, ROUTINE W REFLEX MICROSCOPIC
BILIRUBIN URINE: NEGATIVE
Glucose, UA: NEGATIVE mg/dL
Ketones, ur: NEGATIVE mg/dL
Leukocytes, UA: NEGATIVE
NITRITE: NEGATIVE
Protein, ur: NEGATIVE mg/dL
UROBILINOGEN UA: 0.2 mg/dL (ref 0.0–1.0)
pH: 5 (ref 5.0–8.0)

## 2014-11-10 LAB — URINE MICROSCOPIC-ADD ON

## 2014-11-10 LAB — COMPREHENSIVE METABOLIC PANEL
ALK PHOS: 99 U/L (ref 39–117)
ALT: 25 U/L (ref 0–35)
AST: 21 U/L (ref 0–37)
Albumin: 4.4 g/dL (ref 3.5–5.2)
Anion gap: 8 (ref 5–15)
BUN: 20 mg/dL (ref 6–23)
CALCIUM: 9.6 mg/dL (ref 8.4–10.5)
CO2: 26 mmol/L (ref 19–32)
CREATININE: 0.85 mg/dL (ref 0.50–1.10)
Chloride: 103 mmol/L (ref 96–112)
GFR calc Af Amer: 83 mL/min — ABNORMAL LOW (ref >90)
GFR, EST NON AFRICAN AMERICAN: 72 mL/min — AB (ref >90)
GLUCOSE: 114 mg/dL — AB (ref 70–99)
Potassium: 3.6 mmol/L (ref 3.5–5.1)
Sodium: 137 mmol/L (ref 135–145)
TOTAL PROTEIN: 8.1 g/dL (ref 6.0–8.3)
Total Bilirubin: 0.7 mg/dL (ref 0.3–1.2)

## 2014-11-10 LAB — TROPONIN I

## 2014-11-10 MED ORDER — ESCITALOPRAM OXALATE 10 MG PO TABS: ORAL_TABLET | ORAL | Status: DC

## 2014-11-10 NOTE — ED Notes (Signed)
PT stated she went to the MD office a week ago and was started on Levaquin on 11/03/14 and has 2 days left on antibiotic for URI. PT stated for about a week now she has been extremely fatigued and was sent from [facility] to ED for eval d/t weakness. PT denies any SOB or urinary symptoms.

## 2014-11-10 NOTE — ED Provider Notes (Signed)
CSN: [id number]     Arrival date & time 11/10/14  6440 History   First MD Initiated Contact with Patient 11/10/14 [phone]     Chief Complaint  Patient presents with  . Fatigue     (Consider location/radiation/quality/duration/timing/severity/associated sxs/prior Treatment) HPI Comments: Pt was sent over by her pcp to be evaluated. Pt states that she was put on levaquin on 2/4 for uri symptoms. Since then she feels so tired. Pt states that she doesn't have the energy to do anything. Denies sob, cp, dizziness, n/v/d, swelling or fever. Pt states that she is sad about the loss of mother and brother in the last year.   The history is provided by the patient. No language interpreter was used.    Past Medical History  Diagnosis Date  . Anxiety   . Hypercholesterolemia   . PONV (postoperative nausea and vomiting)   . History of blood transfusion 1975    at [facility] after vag delivery   Past Surgical History  Procedure Laterality Date  . Appendectomy    . Tubal ligation    . Cholecystectomy    . Foot surgery    . Laparoscopy  06/24/2012    Procedure: LAPAROSCOPY OPERATIVE;  Surgeon: Daria Pastures, MD;  Location: [facility];  Service: Gynecology;  Laterality: N/A;  . Vaginal hysterectomy  06/24/2012    Procedure: HYSTERECTOMY VAGINAL;  Surgeon: Daria Pastures, MD;  Location: [facility];  Service: Gynecology;  Laterality: N/A;  . Bladder suspension  06/24/2012    Procedure: TRANSVAGINAL TAPE (TVT) PROCEDURE;  Surgeon: Daria Pastures, MD;  Location: [facility];  Service: Gynecology;  Laterality: N/A;  . Anterior and posterior repair  06/24/2012    Procedure: ANTERIOR (CYSTOCELE) AND POSTERIOR REPAIR (RECTOCELE);  Surgeon: Daria Pastures, MD;  Location: [facility];  Service: Gynecology;  Laterality: N/A;  anterior repair  . Cystoscopy  06/24/2012    Procedure: CYSTOSCOPY;  Surgeon: Daria Pastures, MD;  Location: [facility];  Service: Gynecology;  Laterality: N/A;   No family history  on file. History  Substance Use Topics  . Smoking status: Former Research scientist (life sciences)  . Smokeless tobacco: Not on file  . Alcohol Use: No   OB History    No data available     Review of Systems  All other systems reviewed and are negative.     Allergies  Codeine  Home Medications   Prior to Admission medications   Medication Sig Start Date End Date Taking? Authorizing Provider  ALPRAZolam Duanne Moron) 1 MG tablet Take 1 mg by mouth at bedtime. For sleep   Yes Historical Provider, MD  simvastatin (ZOCOR) 40 MG tablet Take 1 tablet by mouth daily. 08/03/14  Yes Historical Provider, MD  cefUROXime (CEFTIN) 250 MG tablet Take 1 tablet (250 mg total) by mouth 2 (two) times daily. Patient not taking: Reported on 11/10/2014 06/25/12   Daria Pastures, MD  oxyCODONE-acetaminophen (PERCOCET/ROXICET) 5-325 MG per tablet Take 1-2 tablets by mouth every 4 (four) hours as needed (moderate to severe pain (when tolerating fluids)). Patient not taking: Reported on 11/10/2014 06/25/12   Daria Pastures, MD  phenazopyridine (PYRIDIUM) 200 MG tablet Take 1 tablet (200 mg total) by mouth 3 (three) times daily with meals. Patient not taking: Reported on 11/10/2014 06/25/12   Daria Pastures, MD   BP 167/74 mmHg  Pulse 71  Temp(Src) 98.4 F (36.9 C) (Oral)  Resp 18  Ht 5\' 6"  (1.676 m)  Wt 190 lb (86.183  kg)  BMI 30.68 kg/m2  SpO2 99% Physical Exam  Constitutional: She is oriented to person, place, and time. She appears well-developed and well-nourished.  HENT:  Head: Normocephalic and atraumatic.  Eyes: Conjunctivae and EOM are normal. Pupils are equal, round, and reactive to light.  Neck: Normal range of motion. Neck supple.  Cardiovascular: Normal rate and regular rhythm.   Pulmonary/Chest: Effort normal and breath sounds normal.  Abdominal: Soft. Bowel sounds are normal. There is no tenderness.  Musculoskeletal: Normal range of motion.  Neurological: She is alert and oriented to person, place, and  time. Coordination normal.  Skin: Skin is warm and dry.  Psychiatric: She has a normal mood and affect.  Pt tearful when asked if depressed. Denies si/hi  Nursing note and vitals reviewed.   ED Course  Procedures (including critical care time) Labs Review Labs Reviewed  CBC WITH DIFFERENTIAL/PLATELET - Abnormal; Notable for the following:    Hemoglobin 15.2 (*)    All other components within normal limits  COMPREHENSIVE METABOLIC PANEL - Abnormal; Notable for the following:    Glucose, Bld 114 (*)    GFR calc non Af Amer 72 (*)    GFR calc Af Amer 83 (*)    All other components within normal limits  URINALYSIS, ROUTINE W REFLEX MICROSCOPIC - Abnormal; Notable for the following:    Specific Gravity, Urine >1.030 (*)    Hgb urine dipstick MODERATE (*)    All other components within normal limits  TROPONIN I  URINE MICROSCOPIC-ADD ON    Imaging Review Dg Chest 2 View  11/10/2014   CLINICAL DATA:  Cough and congestion.  EXAM: CHEST  2 VIEW  COMPARISON:  10/09/2010.  FINDINGS: Mediastinum and hilar structures are normal. The lungs are clear. Heart size normal. Biapical pleural thickening noted consistent with scarring. No acute bony abnormality identified. Cholecystectomy.  IMPRESSION: No acute pulmonary disease. Biapical pleural-parenchymal thickening again noted consistent with scarring.   Electronically Signed   By: Marcello Moores  Register   On: 11/10/2014 09:01     EKG Interpretation None      MDM   Final diagnoses:  Fatigue due to depression    No acute reason for pt symptoms. After long discussion with pt think likely related to depression. Talked with Dr. Hilma Favors about starting on medication and he recommended lexapro. Discussed with pt and she states that she has had a bad reaction to them in the past and she doesn't want me to give her anything she just wants to see Dr. Hilma Favors. Pt denies si/hi   Glendell Docker, NP 11/10/14 [place], MD 11/10/14 [phone]

## 2014-11-10 NOTE — Discharge Instructions (Signed)

## 2015-06-07 ENCOUNTER — Encounter: Payer: Self-pay | Admitting: Gastroenterology

## 2015-09-12 ENCOUNTER — Other Ambulatory Visit (HOSPITAL_COMMUNITY): Payer: Self-pay | Admitting: Family Medicine

## 2015-09-12 DIAGNOSIS — Z1231 Encounter for screening mammogram for malignant neoplasm of breast: Secondary | ICD-10-CM

## 2015-09-18 ENCOUNTER — Inpatient Hospital Stay (HOSPITAL_COMMUNITY): Admission: RE | Admit: 2015-09-18 | Payer: 59 | Source: Ambulatory Visit

## 2016-01-01 DIAGNOSIS — H524 Presbyopia: Secondary | ICD-10-CM | POA: Diagnosis not present

## 2016-02-20 DIAGNOSIS — J029 Acute pharyngitis, unspecified: Secondary | ICD-10-CM | POA: Diagnosis not present

## 2016-02-20 DIAGNOSIS — Z6830 Body mass index (BMI) 30.0-30.9, adult: Secondary | ICD-10-CM | POA: Diagnosis not present

## 2016-02-20 DIAGNOSIS — E6609 Other obesity due to excess calories: Secondary | ICD-10-CM | POA: Diagnosis not present

## 2016-06-04 MED FILL — ALPRAZolam 1 MG TABS: 1 | 60 days supply | Qty: 120 | Fill #0

## 2016-06-04 MED FILL — VALACYCLOVIR HCL 500 MG TAB: 500 | 4 days supply | Qty: 21 | Fill #0

## 2016-06-04 MED FILL — SIMVASTATIN 40 MG TABLET: 40 | 90 days supply | Qty: 90 | Fill #0

## 2016-06-24 DIAGNOSIS — M779 Enthesopathy, unspecified: Secondary | ICD-10-CM | POA: Diagnosis not present

## 2016-06-24 DIAGNOSIS — E6609 Other obesity due to excess calories: Secondary | ICD-10-CM | POA: Diagnosis not present

## 2016-06-24 DIAGNOSIS — Z6831 Body mass index (BMI) 31.0-31.9, adult: Secondary | ICD-10-CM | POA: Diagnosis not present

## 2016-06-24 DIAGNOSIS — Z1389 Encounter for screening for other disorder: Secondary | ICD-10-CM | POA: Diagnosis not present

## 2016-06-24 DIAGNOSIS — M84378D Stress fracture, left toe(s), subsequent encounter for fracture with routine healing: Secondary | ICD-10-CM | POA: Diagnosis not present

## 2016-06-24 MED FILL — MELOXICAM 7.5 MG TABLET: 7.5 | 10 days supply | Qty: 20 | Fill #0

## 2016-08-12 MED FILL — VALACYCLOVIR HCL 500 MG TAB: 500 | 7 days supply | Qty: 21 | Fill #0

## 2016-08-20 DIAGNOSIS — Z1389 Encounter for screening for other disorder: Secondary | ICD-10-CM | POA: Diagnosis not present

## 2016-08-20 DIAGNOSIS — F419 Anxiety disorder, unspecified: Secondary | ICD-10-CM | POA: Diagnosis not present

## 2016-08-20 DIAGNOSIS — E782 Mixed hyperlipidemia: Secondary | ICD-10-CM | POA: Diagnosis not present

## 2016-08-20 DIAGNOSIS — Z6831 Body mass index (BMI) 31.0-31.9, adult: Secondary | ICD-10-CM | POA: Diagnosis not present

## 2016-08-20 MED FILL — FAMCICLOVIR 500 MG TABLET: 500 | 15 days supply | Qty: 30 | Fill #0

## 2016-08-21 MED FILL — ALPRAZolam 1 MG TABS: 1 | 90 days supply | Qty: 180 | Fill #0

## 2016-09-11 MED FILL — SIMVASTATIN 40 MG TABLET: 40 | 60 days supply | Qty: 60 | Fill #1

## 2016-09-16 ENCOUNTER — Ambulatory Visit (HOSPITAL_COMMUNITY)
Admission: RE | Admit: 2016-09-16 | Discharge: 2016-09-16 | Disposition: A | Discharge status: Home / Self Care | Payer: 59 | Source: Ambulatory Visit | Attending: Family Medicine | Admitting: Family Medicine

## 2016-09-16 DIAGNOSIS — Z1231 Encounter for screening mammogram for malignant neoplasm of breast: Secondary | ICD-10-CM | POA: Diagnosis not present

## 2016-09-18 DIAGNOSIS — J069 Acute upper respiratory infection, unspecified: Secondary | ICD-10-CM | POA: Diagnosis not present

## 2016-09-18 DIAGNOSIS — Z1389 Encounter for screening for other disorder: Secondary | ICD-10-CM | POA: Diagnosis not present

## 2016-09-18 DIAGNOSIS — E6609 Other obesity due to excess calories: Secondary | ICD-10-CM | POA: Diagnosis not present

## 2016-09-18 DIAGNOSIS — Z6831 Body mass index (BMI) 31.0-31.9, adult: Secondary | ICD-10-CM | POA: Diagnosis not present

## 2016-09-18 MED FILL — AZITHROMYCIN 250 MG TABLET: 250 | 5 days supply | Qty: 6 | Fill #0

## 2016-09-24 DIAGNOSIS — F419 Anxiety disorder, unspecified: Secondary | ICD-10-CM | POA: Diagnosis not present

## 2016-09-24 DIAGNOSIS — E782 Mixed hyperlipidemia: Secondary | ICD-10-CM | POA: Diagnosis not present

## 2016-09-24 DIAGNOSIS — Z1389 Encounter for screening for other disorder: Secondary | ICD-10-CM | POA: Diagnosis not present

## 2016-09-26 DIAGNOSIS — E782 Mixed hyperlipidemia: Secondary | ICD-10-CM | POA: Diagnosis not present

## 2016-09-26 DIAGNOSIS — Z0001 Encounter for general adult medical examination with abnormal findings: Secondary | ICD-10-CM | POA: Diagnosis not present

## 2016-09-26 DIAGNOSIS — Z1389 Encounter for screening for other disorder: Secondary | ICD-10-CM | POA: Diagnosis not present

## 2016-09-26 DIAGNOSIS — L821 Other seborrheic keratosis: Secondary | ICD-10-CM | POA: Diagnosis not present

## 2016-09-26 DIAGNOSIS — L570 Actinic keratosis: Secondary | ICD-10-CM | POA: Diagnosis not present

## 2016-09-26 DIAGNOSIS — L820 Inflamed seborrheic keratosis: Secondary | ICD-10-CM | POA: Diagnosis not present

## 2016-09-26 DIAGNOSIS — X32XXXD Exposure to sunlight, subsequent encounter: Secondary | ICD-10-CM | POA: Diagnosis not present

## 2016-09-26 DIAGNOSIS — Z6832 Body mass index (BMI) 32.0-32.9, adult: Secondary | ICD-10-CM | POA: Diagnosis not present

## 2016-09-26 DIAGNOSIS — R946 Abnormal results of thyroid function studies: Secondary | ICD-10-CM | POA: Diagnosis not present

## 2016-09-26 MED FILL — LEVOTHYROXINE 25 MCG TABLET: 25 | 30 days supply | Qty: 30 | Fill #0

## 2016-10-17 DIAGNOSIS — J069 Acute upper respiratory infection, unspecified: Secondary | ICD-10-CM | POA: Diagnosis not present

## 2016-10-17 DIAGNOSIS — E6609 Other obesity due to excess calories: Secondary | ICD-10-CM | POA: Diagnosis not present

## 2016-10-17 DIAGNOSIS — Z6832 Body mass index (BMI) 32.0-32.9, adult: Secondary | ICD-10-CM | POA: Diagnosis not present

## 2016-10-17 DIAGNOSIS — J209 Acute bronchitis, unspecified: Secondary | ICD-10-CM | POA: Diagnosis not present

## 2016-10-17 DIAGNOSIS — Z1389 Encounter for screening for other disorder: Secondary | ICD-10-CM | POA: Diagnosis not present

## 2016-10-17 MED FILL — AZITHROMYCIN 250 MG TABLET: 250 | 5 days supply | Qty: 6 | Fill #0

## 2016-11-04 DIAGNOSIS — Z6831 Body mass index (BMI) 31.0-31.9, adult: Secondary | ICD-10-CM | POA: Diagnosis not present

## 2016-11-04 DIAGNOSIS — E039 Hypothyroidism, unspecified: Secondary | ICD-10-CM | POA: Diagnosis not present

## 2016-11-04 DIAGNOSIS — E6609 Other obesity due to excess calories: Secondary | ICD-10-CM | POA: Diagnosis not present

## 2016-11-04 DIAGNOSIS — J0100 Acute maxillary sinusitis, unspecified: Secondary | ICD-10-CM | POA: Diagnosis not present

## 2016-11-04 DIAGNOSIS — Z1389 Encounter for screening for other disorder: Secondary | ICD-10-CM | POA: Diagnosis not present

## 2016-11-05 MED FILL — LEVOTHYROXINE 25 MCG TABLET: 25 | 90 days supply | Qty: 90 | Fill #0 | Status: TO

## 2016-11-14 MED FILL — SIMVASTATIN 40 MG TABLET: 40 | 90 days supply | Qty: 90 | Fill #0 | Status: TO

## 2016-11-21 MED FILL — FAMCICLOVIR 500 MG TABLET: 500 | 15 days supply | Qty: 30 | Fill #1

## 2017-02-07 ENCOUNTER — Ambulatory Visit (HOSPITAL_COMMUNITY)
Admission: RE | Admit: 2017-02-07 | Discharge: 2017-02-07 | Disposition: A | Discharge status: Home / Self Care | Payer: BLUE CROSS/BLUE SHIELD | Source: Ambulatory Visit | Attending: Family Medicine | Admitting: Family Medicine

## 2017-02-07 ENCOUNTER — Other Ambulatory Visit (HOSPITAL_COMMUNITY): Payer: Self-pay | Admitting: Family Medicine

## 2017-02-07 DIAGNOSIS — M25572 Pain in left ankle and joints of left foot: Secondary | ICD-10-CM

## 2017-02-07 DIAGNOSIS — R600 Localized edema: Secondary | ICD-10-CM | POA: Diagnosis present

## 2017-02-17 ENCOUNTER — Other Ambulatory Visit (HOSPITAL_COMMUNITY): Payer: Self-pay | Admitting: Family Medicine

## 2017-02-17 DIAGNOSIS — E2839 Other primary ovarian failure: Secondary | ICD-10-CM

## 2017-03-03 ENCOUNTER — Encounter (HOSPITAL_COMMUNITY): Payer: Self-pay

## 2017-03-03 ENCOUNTER — Inpatient Hospital Stay (HOSPITAL_COMMUNITY)
Admission: RE | Admit: 2017-03-03 | Discharge: 2017-03-03 | Disposition: A | Discharge status: Home / Self Care | Payer: 59 | Source: Ambulatory Visit | Attending: Family Medicine | Admitting: Family Medicine

## 2017-07-09 DIAGNOSIS — Z23 Encounter for immunization: Secondary | ICD-10-CM | POA: Diagnosis not present

## 2017-09-03 ENCOUNTER — Other Ambulatory Visit (HOSPITAL_COMMUNITY): Payer: Self-pay | Admitting: Family Medicine

## 2017-09-03 DIAGNOSIS — Z1231 Encounter for screening mammogram for malignant neoplasm of breast: Secondary | ICD-10-CM

## 2017-09-26 ENCOUNTER — Ambulatory Visit (HOSPITAL_COMMUNITY)
Admission: RE | Admit: 2017-09-26 | Discharge: 2017-09-26 | Disposition: A | Discharge status: Home / Self Care | Payer: Medicare HMO | Source: Ambulatory Visit | Attending: Family Medicine | Admitting: Family Medicine

## 2017-09-26 DIAGNOSIS — R7309 Other abnormal glucose: Secondary | ICD-10-CM | POA: Diagnosis not present

## 2017-09-26 DIAGNOSIS — E782 Mixed hyperlipidemia: Secondary | ICD-10-CM | POA: Diagnosis not present

## 2017-09-26 DIAGNOSIS — E039 Hypothyroidism, unspecified: Secondary | ICD-10-CM | POA: Diagnosis not present

## 2017-09-26 DIAGNOSIS — J302 Other seasonal allergic rhinitis: Secondary | ICD-10-CM | POA: Diagnosis not present

## 2017-09-26 DIAGNOSIS — Z1231 Encounter for screening mammogram for malignant neoplasm of breast: Secondary | ICD-10-CM

## 2017-09-26 DIAGNOSIS — Z0001 Encounter for general adult medical examination with abnormal findings: Secondary | ICD-10-CM | POA: Diagnosis not present

## 2017-09-26 DIAGNOSIS — E6609 Other obesity due to excess calories: Secondary | ICD-10-CM | POA: Diagnosis not present

## 2017-09-26 DIAGNOSIS — Z6834 Body mass index (BMI) 34.0-34.9, adult: Secondary | ICD-10-CM | POA: Diagnosis not present

## 2017-09-29 DIAGNOSIS — E782 Mixed hyperlipidemia: Secondary | ICD-10-CM | POA: Diagnosis not present

## 2017-09-29 DIAGNOSIS — Z6834 Body mass index (BMI) 34.0-34.9, adult: Secondary | ICD-10-CM | POA: Diagnosis not present

## 2017-09-29 DIAGNOSIS — Z0001 Encounter for general adult medical examination with abnormal findings: Secondary | ICD-10-CM | POA: Diagnosis not present

## 2017-09-29 DIAGNOSIS — Z1389 Encounter for screening for other disorder: Secondary | ICD-10-CM | POA: Diagnosis not present

## 2017-09-29 DIAGNOSIS — E6609 Other obesity due to excess calories: Secondary | ICD-10-CM | POA: Diagnosis not present

## 2017-10-14 DIAGNOSIS — R69 Illness, unspecified: Secondary | ICD-10-CM | POA: Diagnosis not present

## 2017-11-03 ENCOUNTER — Ambulatory Visit (INDEPENDENT_AMBULATORY_CARE_PROVIDER_SITE_OTHER): Payer: Medicare HMO | Admitting: Orthopaedic Surgery

## 2017-11-03 ENCOUNTER — Encounter (INDEPENDENT_AMBULATORY_CARE_PROVIDER_SITE_OTHER): Payer: Self-pay | Admitting: Orthopaedic Surgery

## 2017-11-03 ENCOUNTER — Ambulatory Visit (INDEPENDENT_AMBULATORY_CARE_PROVIDER_SITE_OTHER): Payer: Medicare HMO

## 2017-11-03 DIAGNOSIS — M1711 Unilateral primary osteoarthritis, right knee: Secondary | ICD-10-CM | POA: Diagnosis not present

## 2017-11-03 MED ORDER — DICLOFENAC SODIUM 1 % TD GEL: 2.0000 g | Freq: Four times a day (QID) | TRANSDERMAL | 5 refills | Status: DC

## 2017-11-03 MED ORDER — MELOXICAM 7.5 MG PO TABS: 7.5000 mg | ORAL_TABLET | Freq: Two times a day (BID) | ORAL | 2 refills | Status: DC | PRN

## 2017-11-03 MED ORDER — BUPIVACAINE HCL 0.5 % IJ SOLN
2.0000 mL | INTRAMUSCULAR | Status: AC | PRN
  Administered 2017-11-03: 2 mL via INTRA_ARTICULAR

## 2017-11-03 MED ORDER — LIDOCAINE HCL 1 % IJ SOLN
2.0000 mL | INTRAMUSCULAR | Status: AC | PRN
  Administered 2017-11-03: 2 mL

## 2017-11-03 MED ORDER — METHYLPREDNISOLONE ACETATE 40 MG/ML IJ SUSP
40.0000 mg | INTRAMUSCULAR | Status: AC | PRN
  Administered 2017-11-03: 40 mg via INTRA_ARTICULAR

## 2017-11-03 NOTE — Progress Notes (Signed)
Office Visit Note   Patient: Julie Miles           Date of Birth: June 18, 1951           MRN: [id number] Visit Date: 11/03/2017              Requested by: Sharilyn Sites, [address] [place], [place] [address] PCP: Sharilyn Sites, MD   Assessment & Plan: Visit Diagnoses:  1. Primary osteoarthritis of right knee     Plan: Impression 67 year old female with right knee osteoarthritis.  Cortisone injection was performed today.  Prescription for Voltaren gel and meloxicam.  Questions encouraged and answered.  Follow-up as needed.  Follow-Up Instructions: Return if symptoms worsen or fail to improve.   Orders:  Orders Placed This Encounter  Procedures  . XR KNEE 3 VIEW RIGHT   Meds ordered this encounter  Medications  . diclofenac sodium (VOLTAREN) 1 % GEL    Sig: Apply 2 g topically 4 (four) times daily.    Dispense:  1 Tube    Refill:  5  . meloxicam (MOBIC) 7.5 MG tablet    Sig: Take 1 tablet (7.5 mg total) by mouth 2 (two) times daily as needed for pain.    Dispense:  30 tablet    Refill:  2      Procedures: Large Joint Inj: R knee on 11/03/2017 11:08 AM Indications: pain Details: 22 G needle  Arthrogram: No  Medications: 40 mg methylPREDNISolone acetate 40 MG/ML; 2 mL lidocaine 1 %; 2 mL bupivacaine 0.5 % Outcome: tolerated well, no immediate complications Consent was given by the patient. Patient was prepped and draped in the usual sterile fashion.       Clinical Data: No additional findings.   Subjective: Chief Complaint  Patient presents with  . Right Knee - Pain    Patient is a 67 year old female who comes in with 23-month history of worsening constant right knee pain of insidious onset.  Denies any injuries.  Pain does not radiate.  Her pain is more so on the medial side.  Denies any mechanical symptoms.    Review of Systems  Constitutional: Negative.   HENT: Negative.   Eyes: Negative.   Respiratory: Negative.   Cardiovascular:  Negative.   Endocrine: Negative.   Musculoskeletal: Negative.   Neurological: Negative.   Hematological: Negative.   Psychiatric/Behavioral: Negative.   All other systems reviewed and are negative.    Objective: Vital Signs: There were no vitals taken for this visit.  Physical Exam  Constitutional: She is oriented to person, place, and time. She appears well-developed and well-nourished.  HENT:  Head: Normocephalic and atraumatic.  Eyes: EOM are normal.  Neck: Neck supple.  Pulmonary/Chest: Effort normal.  Abdominal: Soft.  Neurological: She is alert and oriented to person, place, and time.  Skin: Skin is warm. Capillary refill takes less than 2 seconds.  Psychiatric: She has a normal mood and affect. Her behavior is normal. Judgment and thought content normal.  Nursing note and vitals reviewed.   Ortho Exam Right knee exam shows a trace joint effusion.  Collaterals and cruciates are stable.  Pes bursa nontender. Specialty Comments:  No specialty comments available.  Imaging: Xr Knee 3 View Right  Result Date: 11/03/2017 Moderate tricompartmental osteoarthritis with greater than 50% joint space narrowing.  Periarticular spurring    PMFS History: Patient Active Problem List   Diagnosis Date Noted  . ANXIETY 07/20/2010  . HEMATOCHEZIA 07/20/2010   Past Medical History:  Diagnosis Date  . Anxiety   . History of blood transfusion 1975   at [facility] after vag delivery  . Hypercholesterolemia   . PONV (postoperative nausea and vomiting)     History reviewed. No pertinent family history.  Past Surgical History:  Procedure Laterality Date  . ANTERIOR AND POSTERIOR REPAIR  06/24/2012   Procedure: ANTERIOR (CYSTOCELE) AND POSTERIOR REPAIR (RECTOCELE);  Surgeon: Daria Pastures, MD;  Location: [facility];  Service: Gynecology;  Laterality: N/A;  anterior repair  . APPENDECTOMY    . BLADDER SUSPENSION  06/24/2012   Procedure: TRANSVAGINAL TAPE (TVT) PROCEDURE;   Surgeon: Daria Pastures, MD;  Location: [facility];  Service: Gynecology;  Laterality: N/A;  . CHOLECYSTECTOMY    . CYSTOSCOPY  06/24/2012   Procedure: CYSTOSCOPY;  Surgeon: Daria Pastures, MD;  Location: [facility];  Service: Gynecology;  Laterality: N/A;  . FOOT SURGERY    . LAPAROSCOPY  06/24/2012   Procedure: LAPAROSCOPY OPERATIVE;  Surgeon: Daria Pastures, MD;  Location: [facility];  Service: Gynecology;  Laterality: N/A;  . TUBAL LIGATION    . VAGINAL HYSTERECTOMY  06/24/2012   Procedure: HYSTERECTOMY VAGINAL;  Surgeon: Daria Pastures, MD;  Location: [facility];  Service: Gynecology;  Laterality: N/A;   Social History   Occupational History  . Not on file  Tobacco Use  . Smoking status: Former Smoker    Last attempt to quit: 11/03/2017  . Smokeless tobacco: Never Used  . Tobacco comment: STATES SHE DOES NOT SMOKE.  Substance and Sexual Activity  . Alcohol use: No  . Drug use: No  . Sexual activity: Not on file

## 2017-11-17 DIAGNOSIS — E039 Hypothyroidism, unspecified: Secondary | ICD-10-CM | POA: Diagnosis not present

## 2017-11-17 DIAGNOSIS — E6609 Other obesity due to excess calories: Secondary | ICD-10-CM | POA: Diagnosis not present

## 2017-11-17 DIAGNOSIS — Z1389 Encounter for screening for other disorder: Secondary | ICD-10-CM | POA: Diagnosis not present

## 2017-11-17 DIAGNOSIS — Z6833 Body mass index (BMI) 33.0-33.9, adult: Secondary | ICD-10-CM | POA: Diagnosis not present

## 2017-12-08 DIAGNOSIS — L820 Inflamed seborrheic keratosis: Secondary | ICD-10-CM | POA: Diagnosis not present

## 2017-12-08 DIAGNOSIS — X32XXXD Exposure to sunlight, subsequent encounter: Secondary | ICD-10-CM | POA: Diagnosis not present

## 2017-12-08 DIAGNOSIS — L308 Other specified dermatitis: Secondary | ICD-10-CM | POA: Diagnosis not present

## 2017-12-08 DIAGNOSIS — L570 Actinic keratosis: Secondary | ICD-10-CM | POA: Diagnosis not present

## 2017-12-08 DIAGNOSIS — D225 Melanocytic nevi of trunk: Secondary | ICD-10-CM | POA: Diagnosis not present

## 2017-12-29 DIAGNOSIS — L258 Unspecified contact dermatitis due to other agents: Secondary | ICD-10-CM | POA: Diagnosis not present

## 2017-12-30 IMAGING — MG 2D DIGITAL SCREENING BILATERAL MAMMOGRAM WITH CAD AND ADJUNCT TO
6 of 9 series · 6 of 25 positions shown · non-contrast
Comparison: Previous exam(s).

CLINICAL DATA: Screening.

EXAM:
2D DIGITAL SCREENING BILATERAL MAMMOGRAM WITH CAD AND ADJUNCT TOMO

[L MLO (1 of 2)]
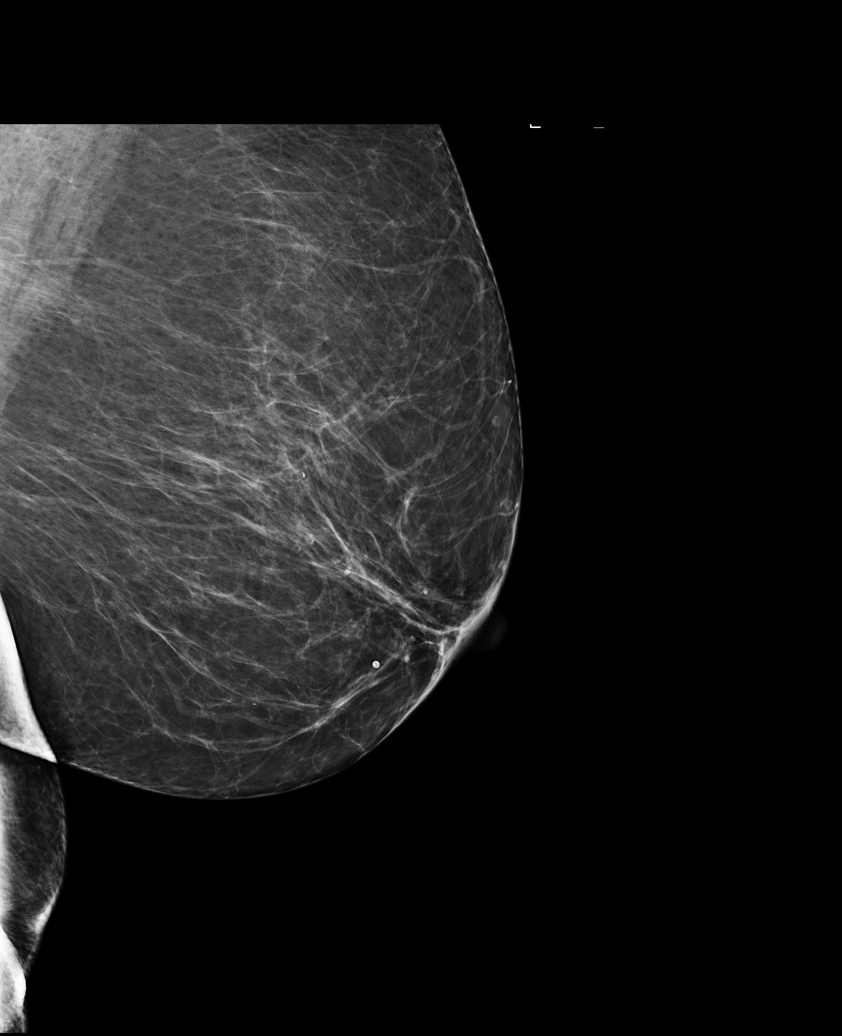

[R CC]
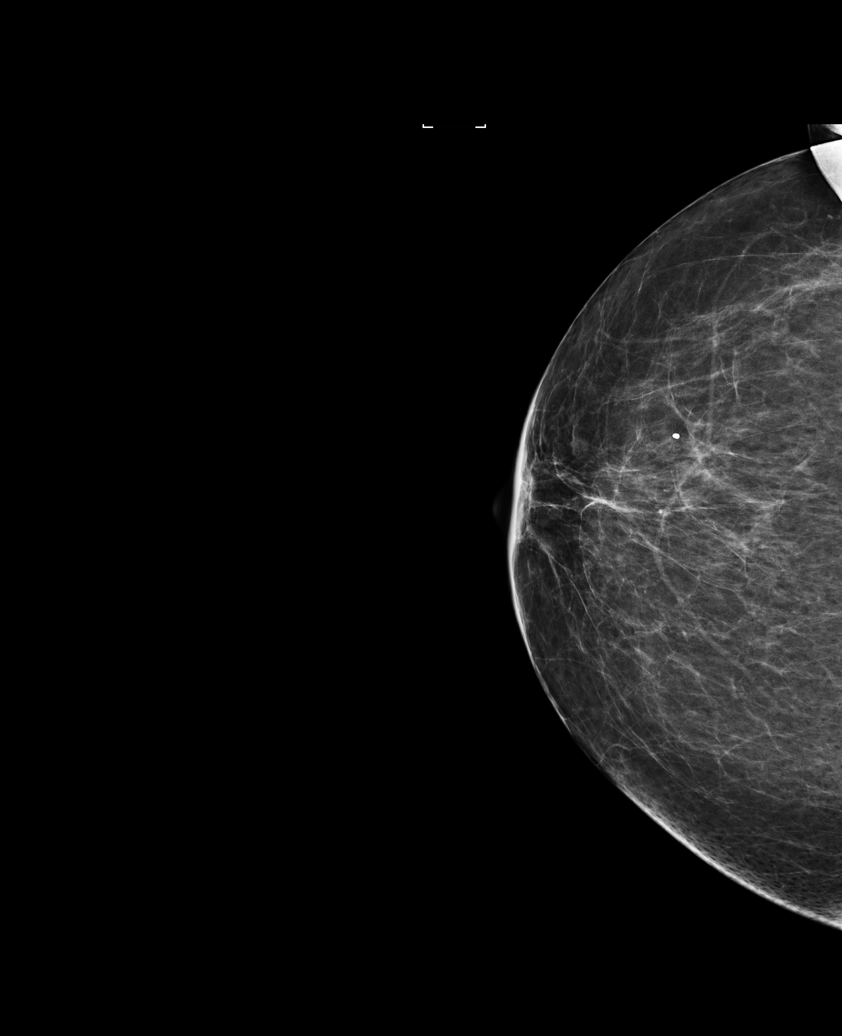

[L MLO (2 of 2)]
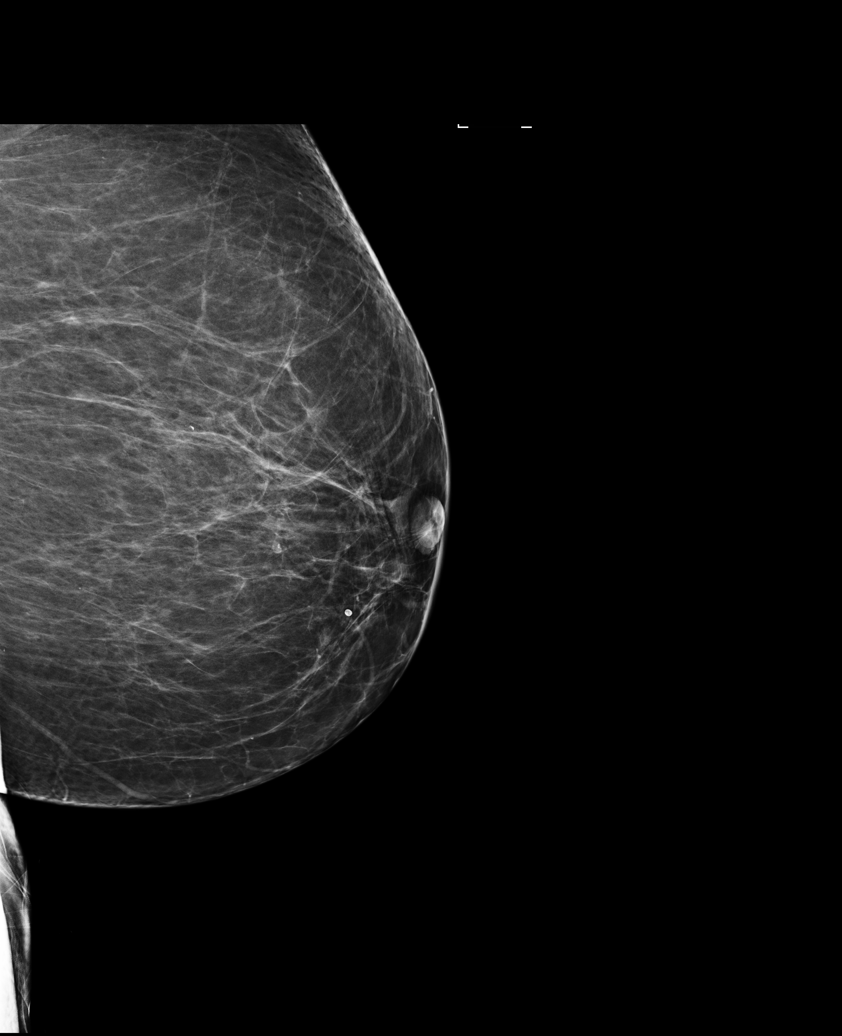

[L CC]
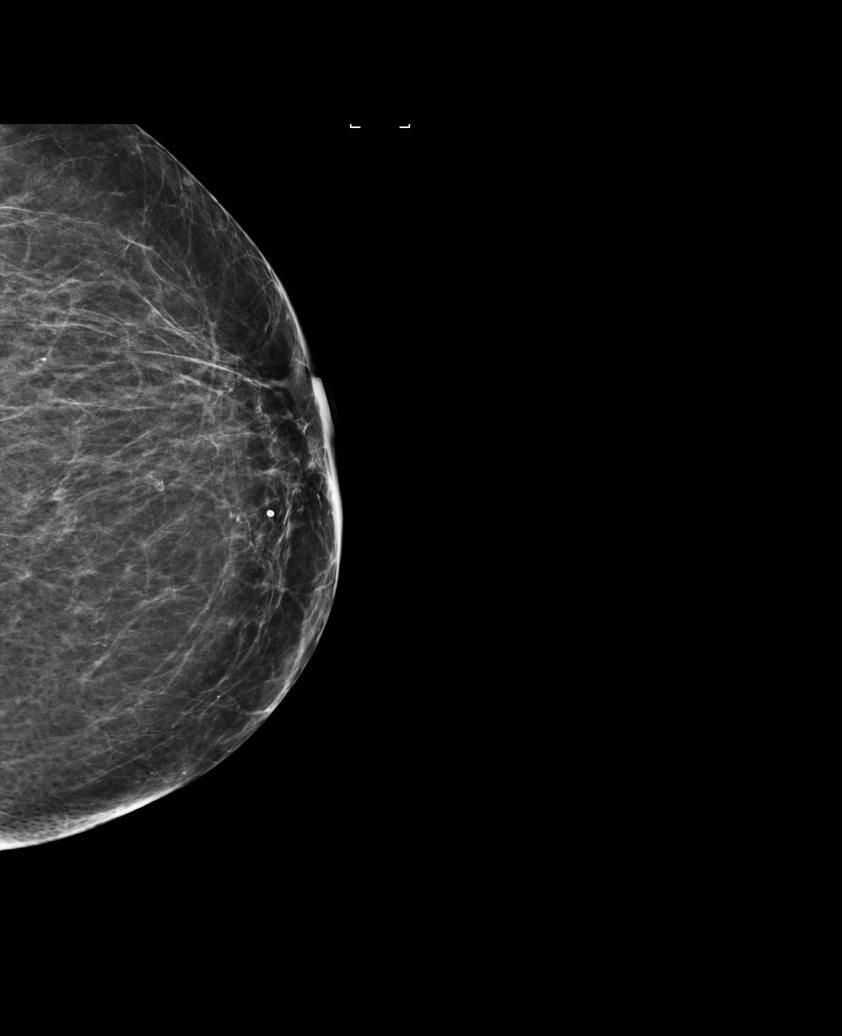

[R MLO]
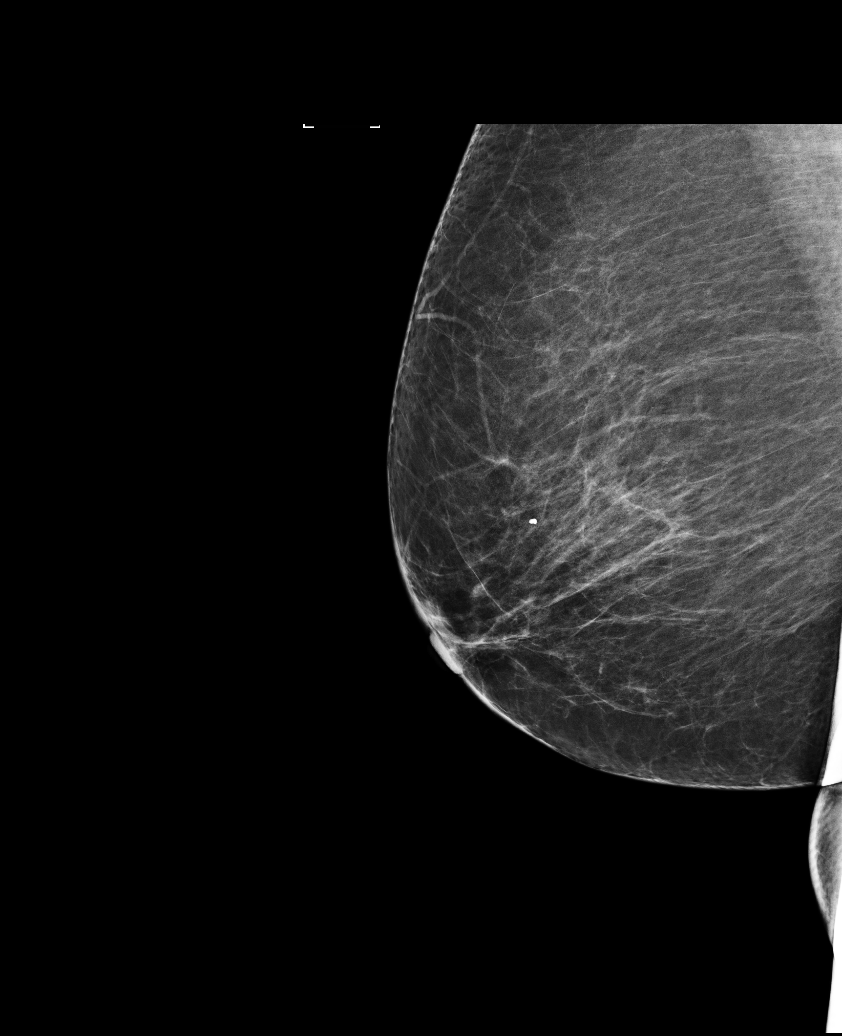

[L MLO tomo · tomo slice 45/88.0]
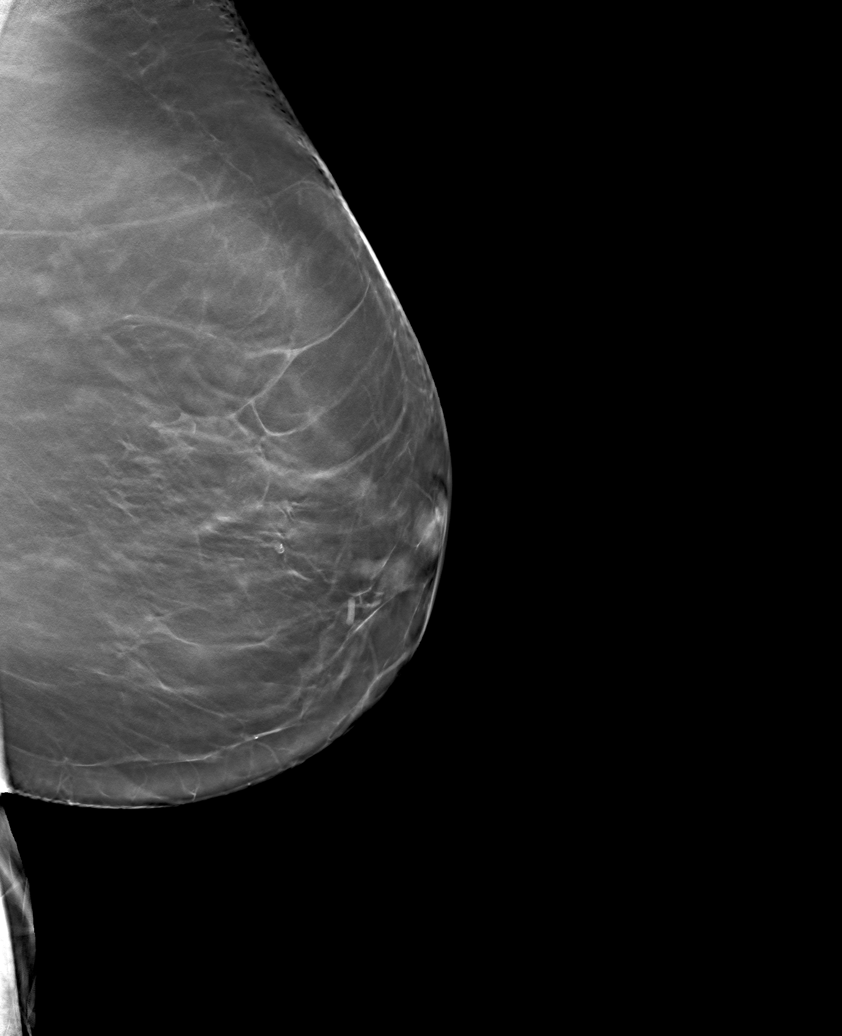

[6 of 25 positions shown; findings below may reference images not displayed]

ACR Breast Density Category b: There are scattered areas of
fibroglandular density.
FINDINGS: There are no findings suspicious for malignancy. Images were
processed with CAD.
IMPRESSION: No mammographic evidence of malignancy. A result letter of this
screening mammogram will be mailed directly to the patient.

RECOMMENDATION:
Screening mammogram in one year. (Code:97-6-RS4)

BI-RADS CATEGORY  1: Negative.

## 2018-01-05 DIAGNOSIS — E039 Hypothyroidism, unspecified: Secondary | ICD-10-CM | POA: Diagnosis not present

## 2018-01-05 DIAGNOSIS — E6609 Other obesity due to excess calories: Secondary | ICD-10-CM | POA: Diagnosis not present

## 2018-01-05 DIAGNOSIS — R7309 Other abnormal glucose: Secondary | ICD-10-CM | POA: Diagnosis not present

## 2018-01-05 DIAGNOSIS — Z6834 Body mass index (BMI) 34.0-34.9, adult: Secondary | ICD-10-CM | POA: Diagnosis not present

## 2018-01-05 DIAGNOSIS — E782 Mixed hyperlipidemia: Secondary | ICD-10-CM | POA: Diagnosis not present

## 2018-01-05 DIAGNOSIS — R69 Illness, unspecified: Secondary | ICD-10-CM | POA: Diagnosis not present

## 2018-01-12 ENCOUNTER — Ambulatory Visit (HOSPITAL_COMMUNITY)
Admission: RE | Admit: 2018-01-12 | Discharge: 2018-01-12 | Disposition: A | Discharge status: Home / Self Care | Payer: Medicare HMO | Source: Ambulatory Visit | Attending: Family Medicine | Admitting: Family Medicine

## 2018-01-12 DIAGNOSIS — M8589 Other specified disorders of bone density and structure, multiple sites: Secondary | ICD-10-CM | POA: Diagnosis not present

## 2018-01-12 DIAGNOSIS — E2839 Other primary ovarian failure: Secondary | ICD-10-CM | POA: Insufficient documentation

## 2018-01-12 DIAGNOSIS — Z780 Asymptomatic menopausal state: Secondary | ICD-10-CM | POA: Diagnosis not present

## 2018-01-28 DIAGNOSIS — Z111 Encounter for screening for respiratory tuberculosis: Secondary | ICD-10-CM | POA: Diagnosis not present

## 2018-02-12 DIAGNOSIS — H524 Presbyopia: Secondary | ICD-10-CM | POA: Diagnosis not present

## 2018-02-18 DIAGNOSIS — E6609 Other obesity due to excess calories: Secondary | ICD-10-CM | POA: Diagnosis not present

## 2018-02-18 DIAGNOSIS — J0100 Acute maxillary sinusitis, unspecified: Secondary | ICD-10-CM | POA: Diagnosis not present

## 2018-02-18 DIAGNOSIS — J069 Acute upper respiratory infection, unspecified: Secondary | ICD-10-CM | POA: Diagnosis not present

## 2018-02-18 DIAGNOSIS — Z6834 Body mass index (BMI) 34.0-34.9, adult: Secondary | ICD-10-CM | POA: Diagnosis not present

## 2018-03-30 ENCOUNTER — Telehealth (INDEPENDENT_AMBULATORY_CARE_PROVIDER_SITE_OTHER): Payer: Self-pay | Admitting: Orthopaedic Surgery

## 2018-03-30 NOTE — Telephone Encounter (Signed)
See message.

## 2018-03-30 NOTE — Telephone Encounter (Signed)
[employer]   Per pt Authorization needed  [id number]  Cortisone shot Right knee injection

## 2018-03-30 NOTE — Telephone Encounter (Signed)
Taking care of by Theron Arista.

## 2018-03-30 NOTE — Telephone Encounter (Signed)
Called pt to advise. Does not need auth per ins company

## 2018-03-30 NOTE — Telephone Encounter (Signed)
Spoke with Karilyn Cota a representative from [employer] and states injection does not require prior auth. LMR#[id number].

## 2018-03-30 NOTE — Telephone Encounter (Signed)
What do I need to do?

## 2018-04-10 ENCOUNTER — Ambulatory Visit (INDEPENDENT_AMBULATORY_CARE_PROVIDER_SITE_OTHER): Payer: Medicare HMO | Admitting: Orthopaedic Surgery

## 2018-04-10 DIAGNOSIS — M25561 Pain in right knee: Secondary | ICD-10-CM | POA: Diagnosis not present

## 2018-04-10 DIAGNOSIS — M1711 Unilateral primary osteoarthritis, right knee: Secondary | ICD-10-CM | POA: Diagnosis not present

## 2018-04-10 MED ORDER — METHYLPREDNISOLONE ACETATE 40 MG/ML IJ SUSP
40.0000 mg | INTRAMUSCULAR | Status: AC | PRN
  Administered 2018-04-10: 40 mg via INTRA_ARTICULAR

## 2018-04-10 MED ORDER — LIDOCAINE HCL 1 % IJ SOLN
2.0000 mL | INTRAMUSCULAR | Status: AC | PRN
  Administered 2018-04-10: 2 mL

## 2018-04-10 MED ORDER — BUPIVACAINE HCL 0.5 % IJ SOLN
2.0000 mL | INTRAMUSCULAR | Status: AC | PRN
Start: 2018-04-10 — End: 2018-04-10
  Administered 2018-04-10: 2 mL via INTRA_ARTICULAR

## 2018-04-10 NOTE — Addendum Note (Signed)
Addended by: Marlyne Beards on: 04/10/2018 10:12 AM   Modules accepted: Orders

## 2018-04-10 NOTE — Progress Notes (Signed)
Office Visit Note   Patient: Julie Miles           Date of Birth: Dec 30, 1950           MRN: [id number] Visit Date: 04/10/2018              Requested by: Sharilyn Sites, [address] [place], [place] [address] PCP: Sharilyn Sites, MD   Assessment & Plan: Visit Diagnoses:  1. Primary osteoarthritis of right knee     Plan: Impression is right knee pain with temporary response from cortisone injection.  Suspicion is for medial meniscal tear therefore recommend MRI to evaluate for this.  Patient does want some pain relief today therefore we injected her with another cortisone injection today.  I will call the patient with the MRI results.  Follow-Up Instructions: Return if symptoms worsen or fail to improve.   Orders:  No orders of the defined types were placed in this encounter.  No orders of the defined types were placed in this encounter.     Procedures: Large Joint Inj: R knee on 04/10/2018 9:11 AM Indications: pain Details: 22 G needle  Arthrogram: No  Medications: 40 mg methylPREDNISolone acetate 40 MG/ML; 2 mL lidocaine 1 %; 2 mL bupivacaine 0.5 % Consent was given by the patient. Patient was prepped and draped in the usual sterile fashion.       Clinical Data: No additional findings.   Subjective: Chief Complaint  Patient presents with  . Right Knee - Follow-up, Pain    S/p cortisone injection 11/03/17    Patient follows up today for continued right knee pain.  Previous cortisone injection helped for about 4 to 6 weeks.  She is reporting significant mechanical pain on the medial side of the knee.  Denies any swelling.   Review of Systems   Objective: Vital Signs: There were no vitals taken for this visit.  Physical Exam  Ortho Exam Right knee exam shows trace effusion.  Significant medial joint line tenderness. Specialty Comments:  No specialty comments available.  Imaging: No results found.   PMFS History: Patient Active Problem  List   Diagnosis Date Noted  . ANXIETY 07/20/2010  . HEMATOCHEZIA 07/20/2010   Past Medical History:  Diagnosis Date  . Anxiety   . History of blood transfusion 1975   at [facility] after vag delivery  . Hypercholesterolemia   . PONV (postoperative nausea and vomiting)     No family history on file.  Past Surgical History:  Procedure Laterality Date  . ANTERIOR AND POSTERIOR REPAIR  06/24/2012   Procedure: ANTERIOR (CYSTOCELE) AND POSTERIOR REPAIR (RECTOCELE);  Surgeon: Daria Pastures, MD;  Location: [facility];  Service: Gynecology;  Laterality: N/A;  anterior repair  . APPENDECTOMY    . BLADDER SUSPENSION  06/24/2012   Procedure: TRANSVAGINAL TAPE (TVT) PROCEDURE;  Surgeon: Daria Pastures, MD;  Location: [facility];  Service: Gynecology;  Laterality: N/A;  . CHOLECYSTECTOMY    . CYSTOSCOPY  06/24/2012   Procedure: CYSTOSCOPY;  Surgeon: Daria Pastures, MD;  Location: [facility];  Service: Gynecology;  Laterality: N/A;  . FOOT SURGERY    . LAPAROSCOPY  06/24/2012   Procedure: LAPAROSCOPY OPERATIVE;  Surgeon: Daria Pastures, MD;  Location: [facility];  Service: Gynecology;  Laterality: N/A;  . TUBAL LIGATION    . VAGINAL HYSTERECTOMY  06/24/2012   Procedure: HYSTERECTOMY VAGINAL;  Surgeon: Daria Pastures, MD;  Location: [facility];  Service: Gynecology;  Laterality: N/A;  Social History   Occupational History  . Not on file  Tobacco Use  . Smoking status: Former Smoker    Last attempt to quit: 11/03/2017    Years since quitting: 0.4  . Smokeless tobacco: Never Used  . Tobacco comment: STATES SHE DOES NOT SMOKE.  Substance and Sexual Activity  . Alcohol use: No  . Drug use: No  . Sexual activity: Not on file

## 2018-04-15 ENCOUNTER — Ambulatory Visit (INDEPENDENT_AMBULATORY_CARE_PROVIDER_SITE_OTHER): Payer: Medicare HMO | Admitting: Orthopaedic Surgery

## 2018-04-17 ENCOUNTER — Ambulatory Visit
Admission: RE | Admit: 2018-04-17 | Discharge: 2018-04-17 | Disposition: A | Discharge status: Home / Self Care | Payer: Medicare HMO | Source: Ambulatory Visit | Attending: Orthopaedic Surgery | Admitting: Orthopaedic Surgery

## 2018-04-17 ENCOUNTER — Telehealth (INDEPENDENT_AMBULATORY_CARE_PROVIDER_SITE_OTHER): Payer: Self-pay | Admitting: Orthopaedic Surgery

## 2018-04-17 DIAGNOSIS — M25561 Pain in right knee: Secondary | ICD-10-CM

## 2018-04-17 NOTE — Progress Notes (Signed)
Discussed MRI findings with patient.  MMT and DJD.  She's feeling better from the cortisone injection and has noticed improvement.  For now, we agreed to just see how she continues to feel from the shot.  She will come back if her pain relief is temporary.

## 2018-04-17 NOTE — Telephone Encounter (Signed)
Patient called stating that she is scheduled for an MRI today and was told by Dr. Erlinda Hong that he would give her results over the phone instead of making another trip to [place].  Please advise.  CB#[phone].  Thank you.

## 2018-04-17 NOTE — Telephone Encounter (Signed)
Yes i'll call her.  Thanks.

## 2018-04-17 NOTE — Telephone Encounter (Signed)
Left full message on patient's vm.

## 2018-04-17 NOTE — Telephone Encounter (Signed)
Is this ok?

## 2018-04-18 DIAGNOSIS — Z1211 Encounter for screening for malignant neoplasm of colon: Secondary | ICD-10-CM | POA: Diagnosis not present

## 2018-04-22 DIAGNOSIS — Z6833 Body mass index (BMI) 33.0-33.9, adult: Secondary | ICD-10-CM | POA: Diagnosis not present

## 2018-04-22 DIAGNOSIS — R69 Illness, unspecified: Secondary | ICD-10-CM | POA: Diagnosis not present

## 2018-04-22 DIAGNOSIS — R7309 Other abnormal glucose: Secondary | ICD-10-CM | POA: Diagnosis not present

## 2018-04-22 DIAGNOSIS — E039 Hypothyroidism, unspecified: Secondary | ICD-10-CM | POA: Diagnosis not present

## 2018-04-22 DIAGNOSIS — Z1389 Encounter for screening for other disorder: Secondary | ICD-10-CM | POA: Diagnosis not present

## 2018-04-22 DIAGNOSIS — E782 Mixed hyperlipidemia: Secondary | ICD-10-CM | POA: Diagnosis not present

## 2018-06-26 DIAGNOSIS — Z23 Encounter for immunization: Secondary | ICD-10-CM | POA: Diagnosis not present

## 2018-09-02 DIAGNOSIS — Z1389 Encounter for screening for other disorder: Secondary | ICD-10-CM | POA: Diagnosis not present

## 2018-09-02 DIAGNOSIS — J209 Acute bronchitis, unspecified: Secondary | ICD-10-CM | POA: Diagnosis not present

## 2018-09-02 DIAGNOSIS — J22 Unspecified acute lower respiratory infection: Secondary | ICD-10-CM | POA: Diagnosis not present

## 2018-09-02 DIAGNOSIS — Z6841 Body Mass Index (BMI) 40.0 and over, adult: Secondary | ICD-10-CM | POA: Diagnosis not present

## 2018-10-05 DIAGNOSIS — R51 Headache: Secondary | ICD-10-CM | POA: Diagnosis not present

## 2018-10-05 DIAGNOSIS — E039 Hypothyroidism, unspecified: Secondary | ICD-10-CM | POA: Diagnosis not present

## 2018-10-05 DIAGNOSIS — Z0001 Encounter for general adult medical examination with abnormal findings: Secondary | ICD-10-CM | POA: Diagnosis not present

## 2018-10-05 DIAGNOSIS — E782 Mixed hyperlipidemia: Secondary | ICD-10-CM | POA: Diagnosis not present

## 2018-10-05 DIAGNOSIS — Z6833 Body mass index (BMI) 33.0-33.9, adult: Secondary | ICD-10-CM | POA: Diagnosis not present

## 2018-10-05 DIAGNOSIS — E6609 Other obesity due to excess calories: Secondary | ICD-10-CM | POA: Diagnosis not present

## 2018-10-05 DIAGNOSIS — Z1389 Encounter for screening for other disorder: Secondary | ICD-10-CM | POA: Diagnosis not present

## 2018-10-05 DIAGNOSIS — R7309 Other abnormal glucose: Secondary | ICD-10-CM | POA: Diagnosis not present

## 2018-10-07 ENCOUNTER — Other Ambulatory Visit (HOSPITAL_COMMUNITY): Payer: Self-pay | Admitting: Family Medicine

## 2018-10-07 DIAGNOSIS — Z1231 Encounter for screening mammogram for malignant neoplasm of breast: Secondary | ICD-10-CM

## 2018-11-02 ENCOUNTER — Ambulatory Visit (HOSPITAL_COMMUNITY)
Admission: RE | Admit: 2018-11-02 | Discharge: 2018-11-02 | Disposition: A | Discharge status: Home / Self Care | Payer: Medicare HMO | Source: Ambulatory Visit | Attending: Family Medicine | Admitting: Family Medicine

## 2018-11-02 DIAGNOSIS — Z1231 Encounter for screening mammogram for malignant neoplasm of breast: Secondary | ICD-10-CM | POA: Diagnosis not present

## 2018-11-04 DIAGNOSIS — M9905 Segmental and somatic dysfunction of pelvic region: Secondary | ICD-10-CM | POA: Diagnosis not present

## 2018-11-04 DIAGNOSIS — M25552 Pain in left hip: Secondary | ICD-10-CM | POA: Diagnosis not present

## 2018-11-04 DIAGNOSIS — M5442 Lumbago with sciatica, left side: Secondary | ICD-10-CM | POA: Diagnosis not present

## 2018-11-04 DIAGNOSIS — M9903 Segmental and somatic dysfunction of lumbar region: Secondary | ICD-10-CM | POA: Diagnosis not present

## 2018-11-04 DIAGNOSIS — M9902 Segmental and somatic dysfunction of thoracic region: Secondary | ICD-10-CM | POA: Diagnosis not present

## 2018-11-06 DIAGNOSIS — M9903 Segmental and somatic dysfunction of lumbar region: Secondary | ICD-10-CM | POA: Diagnosis not present

## 2018-11-06 DIAGNOSIS — M9905 Segmental and somatic dysfunction of pelvic region: Secondary | ICD-10-CM | POA: Diagnosis not present

## 2018-11-06 DIAGNOSIS — M9902 Segmental and somatic dysfunction of thoracic region: Secondary | ICD-10-CM | POA: Diagnosis not present

## 2018-11-06 DIAGNOSIS — M25552 Pain in left hip: Secondary | ICD-10-CM | POA: Diagnosis not present

## 2018-11-06 DIAGNOSIS — M5442 Lumbago with sciatica, left side: Secondary | ICD-10-CM | POA: Diagnosis not present

## 2018-11-11 DIAGNOSIS — M9903 Segmental and somatic dysfunction of lumbar region: Secondary | ICD-10-CM | POA: Diagnosis not present

## 2018-11-11 DIAGNOSIS — M5442 Lumbago with sciatica, left side: Secondary | ICD-10-CM | POA: Diagnosis not present

## 2018-11-11 DIAGNOSIS — M25552 Pain in left hip: Secondary | ICD-10-CM | POA: Diagnosis not present

## 2018-11-11 DIAGNOSIS — M9902 Segmental and somatic dysfunction of thoracic region: Secondary | ICD-10-CM | POA: Diagnosis not present

## 2018-11-11 DIAGNOSIS — M9905 Segmental and somatic dysfunction of pelvic region: Secondary | ICD-10-CM | POA: Diagnosis not present

## 2018-11-19 ENCOUNTER — Ambulatory Visit (INDEPENDENT_AMBULATORY_CARE_PROVIDER_SITE_OTHER): Payer: Medicare HMO

## 2018-11-19 ENCOUNTER — Ambulatory Visit (INDEPENDENT_AMBULATORY_CARE_PROVIDER_SITE_OTHER): Payer: Medicare HMO | Admitting: Orthopaedic Surgery

## 2018-11-19 ENCOUNTER — Encounter (INDEPENDENT_AMBULATORY_CARE_PROVIDER_SITE_OTHER): Payer: Self-pay | Admitting: Orthopaedic Surgery

## 2018-11-19 DIAGNOSIS — M5432 Sciatica, left side: Secondary | ICD-10-CM | POA: Diagnosis not present

## 2018-11-19 MED ORDER — PREDNISONE 10 MG (21) PO TBPK: ORAL_TABLET | ORAL | 0 refills | Status: DC

## 2018-11-19 MED ORDER — METHOCARBAMOL 500 MG PO TABS: 500.0000 mg | ORAL_TABLET | Freq: Every evening | ORAL | 0 refills | Status: DC | PRN

## 2018-11-19 MED ORDER — KETOROLAC TROMETHAMINE 30 MG/ML IJ SOLN: 30.0000 mg | Freq: Once | INTRAMUSCULAR | Status: AC

## 2018-11-19 NOTE — Progress Notes (Signed)
Office Visit Note   Patient: Julie Miles           Date of Birth: 10-25-1950           MRN: [id number] Visit Date: 11/19/2018              Requested by: Sharilyn Sites, [address] [place], [place] [address] PCP: Sharilyn Sites, MD   Assessment & Plan: Visit Diagnoses:  1. Sciatica of left side     Plan: Impression is sciatica left lower extremity.  Will inject the patient with intramuscular Toradol today to help with the immediate pain.  I will also start her on a Sterapred taper and muscle relaxer.  Follow-up with [place] as needed.  Follow-Up Instructions: Return if symptoms worsen or fail to improve.   Orders:  Orders Placed This Encounter  Procedures  . XR HIP UNILAT W OR W/O PELVIS 2-3 VIEWS LEFT  . XR Lumbar Spine 2-3 Views   Meds ordered this encounter  Medications  . predniSONE (STERAPRED UNI-PAK 21 TAB) 10 MG (21) TBPK tablet    Sig: Take as directed    Dispense:  21 tablet    Refill:  0  . methocarbamol (ROBAXIN) 500 MG tablet    Sig: Take 1 tablet (500 mg total) by mouth at bedtime as needed for muscle spasms.    Dispense:  20 tablet    Refill:  0  . ketorolac (TORADOL) 30 MG/ML injection 30 mg      Procedures: No procedures performed   Clinical Data: No additional findings.   Subjective: Chief Complaint  Patient presents with  . Left Hip - Pain    HPI patient is a pleasant 68 year old gentleman who presents our clinic today with left buttocks and leg pain.  This began after lifting a heavy case of water at [employer] about 6 weeks ago.  She describes the pain as a constant ache.  No weakness to either lower extremity.  There is nothing that seems to worsen the pain.  There is nothing really that seemed to relieve the pain.  She occasionally uses ice with minimal relief of symptoms.  She has been seen by chiropractor 4 times without relief of symptoms.  She denies any numbness, tingling or burning to either extremity.  No bowel or bladder  changes saddle paresthesias.  No previous lumbar pathology.  Review of Systems as detailed in HPI.  All others reviewed and are negative.   Objective: Vital Signs: There were no vitals taken for this visit.  Physical Exam well-developed well-nourished female no acute distress.  Alert and oriented x3.  Ortho Exam examination of her lumbar spine reveals no spinous or paraspinous tenderness.  She has marked tenderness in the sciatic notch.  Positive straight leg raise.  Negative logroll negative Fater.  No tenderness over the greater trochanter.  No tenderness over the piriformis.  No focal weakness.  She is neurovascular intact distally.  Specialty Comments:  No specialty comments available.  Imaging: Xr Hip Unilat W Or W/o Pelvis 2-3 Views Left  Result Date: 11/19/2018 Decreased joint space bilateral hips  Xr Lumbar Spine 2-3 Views  Result Date: 11/19/2018 No acute or structural abnormalities    PMFS History: Patient Active Problem List   Diagnosis Date Noted  . Sciatica of left side 11/19/2018  . ANXIETY 07/20/2010  . HEMATOCHEZIA 07/20/2010   Past Medical History:  Diagnosis Date  . Anxiety   . History of blood transfusion 1975   at  [facility] after vag delivery  . Hypercholesterolemia   . PONV (postoperative nausea and vomiting)     History reviewed. No pertinent family history.  Past Surgical History:  Procedure Laterality Date  . ANTERIOR AND POSTERIOR REPAIR  06/24/2012   Procedure: ANTERIOR (CYSTOCELE) AND POSTERIOR REPAIR (RECTOCELE);  Surgeon: Daria Pastures, MD;  Location: [facility];  Service: Gynecology;  Laterality: N/A;  anterior repair  . APPENDECTOMY    . BLADDER SUSPENSION  06/24/2012   Procedure: TRANSVAGINAL TAPE (TVT) PROCEDURE;  Surgeon: Daria Pastures, MD;  Location: [facility];  Service: Gynecology;  Laterality: N/A;  . CHOLECYSTECTOMY    . CYSTOSCOPY  06/24/2012   Procedure: CYSTOSCOPY;  Surgeon: Daria Pastures, MD;  Location: [facility];  Service: Gynecology;  Laterality: N/A;  . FOOT SURGERY    . LAPAROSCOPY  06/24/2012   Procedure: LAPAROSCOPY OPERATIVE;  Surgeon: Daria Pastures, MD;  Location: [facility];  Service: Gynecology;  Laterality: N/A;  . TUBAL LIGATION    . VAGINAL HYSTERECTOMY  06/24/2012   Procedure: HYSTERECTOMY VAGINAL;  Surgeon: Daria Pastures, MD;  Location: [facility];  Service: Gynecology;  Laterality: N/A;   Social History   Occupational History  . Not on file  Tobacco Use  . Smoking status: Former Smoker    Last attempt to quit: 11/03/2017    Years since quitting: 1.0  . Smokeless tobacco: Never Used  . Tobacco comment: STATES SHE DOES NOT SMOKE.  Substance and Sexual Activity  . Alcohol use: No  . Drug use: No  . Sexual activity: Not on file

## 2019-01-28 DIAGNOSIS — Z6834 Body mass index (BMI) 34.0-34.9, adult: Secondary | ICD-10-CM | POA: Diagnosis not present

## 2019-01-28 DIAGNOSIS — E6609 Other obesity due to excess calories: Secondary | ICD-10-CM | POA: Diagnosis not present

## 2019-01-28 DIAGNOSIS — Z23 Encounter for immunization: Secondary | ICD-10-CM | POA: Diagnosis not present

## 2019-01-28 DIAGNOSIS — N342 Other urethritis: Secondary | ICD-10-CM | POA: Diagnosis not present

## 2019-01-31 ENCOUNTER — Encounter (HOSPITAL_COMMUNITY): Payer: Self-pay | Admitting: Emergency Medicine

## 2019-01-31 ENCOUNTER — Emergency Department (HOSPITAL_COMMUNITY): Payer: Medicare HMO

## 2019-01-31 ENCOUNTER — Emergency Department (HOSPITAL_COMMUNITY)
Admission: EM | Admit: 2019-01-31 | Discharge: 2019-01-31 | Disposition: A | Discharge status: Home / Self Care | Payer: Medicare HMO | Attending: Emergency Medicine | Admitting: Emergency Medicine

## 2019-01-31 ENCOUNTER — Other Ambulatory Visit: Payer: Self-pay

## 2019-01-31 DIAGNOSIS — R109 Unspecified abdominal pain: Secondary | ICD-10-CM | POA: Diagnosis not present

## 2019-01-31 DIAGNOSIS — N200 Calculus of kidney: Secondary | ICD-10-CM

## 2019-01-31 DIAGNOSIS — Z87891 Personal history of nicotine dependence: Secondary | ICD-10-CM | POA: Insufficient documentation

## 2019-01-31 DIAGNOSIS — R1030 Lower abdominal pain, unspecified: Secondary | ICD-10-CM | POA: Diagnosis present

## 2019-01-31 DIAGNOSIS — N132 Hydronephrosis with renal and ureteral calculous obstruction: Secondary | ICD-10-CM | POA: Diagnosis not present

## 2019-01-31 DIAGNOSIS — R300 Dysuria: Secondary | ICD-10-CM | POA: Diagnosis not present

## 2019-01-31 LAB — CBC WITH DIFFERENTIAL/PLATELET
Abs Immature Granulocytes: 0.04 10*3/uL (ref 0.00–0.07)
Basophils Absolute: 0 10*3/uL (ref 0.0–0.1)
Basophils Relative: 0 %
Eosinophils Absolute: 0.1 10*3/uL (ref 0.0–0.5)
Eosinophils Relative: 1 %
HCT: 38.3 % (ref 36.0–46.0)
Hemoglobin: 12.9 g/dL (ref 12.0–15.0)
Immature Granulocytes: 0 %
Lymphocytes Relative: 15 %
Lymphs Abs: 1.6 10*3/uL (ref 0.7–4.0)
MCH: 31.2 pg (ref 26.0–34.0)
MCHC: 33.7 g/dL (ref 30.0–36.0)
MCV: 92.7 fL (ref 80.0–100.0)
Monocytes Absolute: 1.1 10*3/uL — ABNORMAL HIGH (ref 0.1–1.0)
Monocytes Relative: 10 %
Neutro Abs: 7.7 10*3/uL (ref 1.7–7.7)
Neutrophils Relative %: 74 %
Platelets: 272 10*3/uL (ref 150–400)
RBC: 4.13 MIL/uL (ref 3.87–5.11)
RDW: 12.1 % (ref 11.5–15.5)
WBC: 10.4 10*3/uL (ref 4.0–10.5)
nRBC: 0 % (ref 0.0–0.2)

## 2019-01-31 LAB — URINALYSIS, ROUTINE W REFLEX MICROSCOPIC
Bacteria, UA: NONE SEEN
Bilirubin Urine: NEGATIVE
Glucose, UA: NEGATIVE mg/dL
Ketones, ur: NEGATIVE mg/dL
Leukocytes,Ua: NEGATIVE
Nitrite: NEGATIVE
Protein, ur: NEGATIVE mg/dL
Specific Gravity, Urine: 1.021 (ref 1.005–1.030)
pH: 6 (ref 5.0–8.0)

## 2019-01-31 LAB — COMPREHENSIVE METABOLIC PANEL
ALT: 23 U/L (ref 0–44)
AST: 22 U/L (ref 15–41)
Albumin: 3.8 g/dL (ref 3.5–5.0)
Alkaline Phosphatase: 86 U/L (ref 38–126)
Anion gap: 12 (ref 5–15)
BUN: 15 mg/dL (ref 8–23)
CO2: 23 mmol/L (ref 22–32)
Calcium: 8.8 mg/dL — ABNORMAL LOW (ref 8.9–10.3)
Chloride: 107 mmol/L (ref 98–111)
Creatinine, Ser: 0.98 mg/dL (ref 0.44–1.00)
GFR calc Af Amer: >60 mL/min (ref >60)
GFR calc non Af Amer: >60 mL/min (ref >60)
Glucose, Bld: 131 mg/dL — ABNORMAL HIGH (ref 70–99)
Potassium: 3.6 mmol/L (ref 3.5–5.1)
Sodium: 142 mmol/L (ref 135–145)
Total Bilirubin: 0.6 mg/dL (ref 0.3–1.2)
Total Protein: 6.8 g/dL (ref 6.5–8.1)

## 2019-01-31 LAB — LIPASE, BLOOD: Lipase: 27 U/L (ref 11–51)

## 2019-01-31 MED ORDER — KETOROLAC TROMETHAMINE 30 MG/ML IJ SOLN
15.0000 mg | Freq: Once | INTRAMUSCULAR | Status: AC
  Administered 2019-01-31: 15 mg via INTRAVENOUS
  Filled 2019-01-31: qty 1

## 2019-01-31 MED ORDER — KETOROLAC TROMETHAMINE 10 MG PO TABS: 10.0000 mg | ORAL_TABLET | Freq: Four times a day (QID) | ORAL | 0 refills | Status: DC | PRN

## 2019-01-31 MED ORDER — TAMSULOSIN HCL 0.4 MG PO CAPS: 0.4000 mg | ORAL_CAPSULE | Freq: Every day | ORAL | 0 refills | Status: AC

## 2019-01-31 NOTE — Discharge Instructions (Signed)
You have been seen in the Emergency Department (ED) today for pain that we believe based on your workup, is caused by kidney stones.  As we have discussed, please drink plenty of fluids.  Please make a follow up appointment with the physician(s) listed elsewhere in this documentation.  You may take pain medication as needed but ONLY as prescribed.  Please also take your prescribed Flomax daily. You can take Toradol as needed for pain for a short period of time. Take it with meals to minimize stomach discomfort. Stop if you develop abdominal pain or see blood in the bowel movements.   Please see your doctor as soon as possible as stones may take 1-3 weeks to pass and you may require additional care or medications.   Return to the Emergency Department (ED) or call your doctor if you have any worsening pain, fever, painful urination, are unable to urinate, or develop other symptoms that concern you.    Kidney Stones Kidney stones (urolithiasis) are deposits that form inside your kidneys. The intense pain is caused by the stone moving through the urinary tract. When the stone moves, the ureter goes into spasm around the stone. The stone is usually passed in the urine.  CAUSES  A disorder that makes certain neck glands produce too much parathyroid hormone (primary hyperparathyroidism). A buildup of uric acid crystals, similar to gout in your joints. Narrowing (stricture) of the ureter. A kidney obstruction present at birth (congenital obstruction). Previous surgery on the kidney or ureters. Numerous kidney infections. SYMPTOMS  Feeling sick to your stomach (nauseous). Throwing up (vomiting). Blood in the urine (hematuria). Pain that usually spreads (radiates) to the groin. Frequency or urgency of urination. DIAGNOSIS  Taking a history and physical exam. Blood or urine tests. CT scan. Occasionally, an examination of the inside of the urinary bladder (cystoscopy) is performed. TREATMENT    Observation. Increasing your fluid intake. Extracorporeal shock wave lithotripsy--This is a noninvasive procedure that uses shock waves to break up kidney stones. Surgery may be needed if you have severe pain or persistent obstruction. There are various surgical procedures. Most of the procedures are performed with the use of small instruments. Only small incisions are needed to accommodate these instruments, so recovery time is minimized. The size, location, and chemical composition are all important variables that will determine the proper choice of action for you. Talk to your health care provider to better understand your situation so that you will minimize the risk of injury to yourself and your kidney.  HOME CARE INSTRUCTIONS  Drink enough water and fluids to keep your urine clear or pale yellow. This will help you to pass the stone or stone fragments. Strain all urine through the provided strainer. Keep all particulate matter and stones for your health care provider to see. The stone causing the pain may be as small as a grain of salt. It is very important to use the strainer each and every time you pass your urine. The collection of your stone will allow your health care provider to analyze it and verify that a stone has actually passed. The stone analysis will often identify what you can do to reduce the incidence of recurrences. Only take over-the-counter or prescription medicines for pain, discomfort, or fever as directed by your health care provider. Keep all follow-up visits as told by your health care provider. This is important. Get follow-up X-rays if required. The absence of pain does not always mean that the stone has passed. It  may have only stopped moving. If the urine remains completely obstructed, it can cause loss of kidney function or even complete destruction of the kidney. It is your responsibility to make sure X-rays and follow-ups are completed. Ultrasounds of the kidney can  show blockages and the status of the kidney. Ultrasounds are not associated with any radiation and can be performed easily in a matter of minutes. Make changes to your daily diet as told by your health care provider. You may be told to: Limit the amount of salt that you eat. Eat 5 or more servings of fruits and vegetables each day. Limit the amount of meat, poultry, fish, and eggs that you eat. Collect a 24-hour urine sample as told by your health care provider. You may need to collect another urine sample every 6-12 months. SEEK MEDICAL CARE IF: You experience pain that is progressive and unresponsive to any pain medicine you have been prescribed. SEEK IMMEDIATE MEDICAL CARE IF:  Pain cannot be controlled with the prescribed medicine. You have a fever or shaking chills. The severity or intensity of pain increases over 18 hours and is not relieved by pain medicine. You develop a new onset of abdominal pain. You feel faint or pass out. You are unable to urinate.   This information is not intended to replace advice given to you by your health care provider. Make sure you discuss any questions you have with your health care provider.   Document Released: 09/16/2005 Document Revised: 06/07/2015 Document Reviewed: 02/17/2013 Elsevier Interactive Patient Education [employer].

## 2019-01-31 NOTE — ED Triage Notes (Signed)
Patient reports UTI x 2 weeks, was seen by PCP on Thursday and given Cipro. Patient states her symptoms are no better, has not been able to urinate but very small amount. C/O severe lower abdominal pain

## 2019-01-31 NOTE — ED Notes (Signed)
Patient transported to CT 

## 2019-01-31 NOTE — ED Provider Notes (Signed)
Emergency Department Provider Note   I have reviewed the triage vital signs and the nursing notes.   HISTORY  Chief Complaint Urinary Tract Infection   HPI Julie Miles is a 68 y.o. female with PMH of HLD and prior cystitis sent to the emergency department with severe suprapubic discomfort with urine frequency.  Patient states that this feels very similar to prior urinary tract infections.  She denies any fevers, shaking chills, flank pain, upper abdominal pain.  She saw her PCP 3 days ago and was started on Cipro.  She is been taking this as directed but has experienced no improvement in symptoms.  She states that in the past Cipro has helped her symptoms.  She tells me that blood was also noted on her UA 3 days ago.  She has no prior history of kidney stone.  No chest pain or shortness of breath symptoms.  No other modifying factors or radiation of symptoms.  Past Medical History:  Diagnosis Date   Anxiety    History of blood transfusion 1975   at [facility] after vag delivery   Hypercholesterolemia    PONV (postoperative nausea and vomiting)     Patient Active Problem List   Diagnosis Date Noted   Sciatica of left side 11/19/2018   ANXIETY 07/20/2010   HEMATOCHEZIA 07/20/2010    Past Surgical History:  Procedure Laterality Date   ANTERIOR AND POSTERIOR REPAIR  06/24/2012   Procedure: ANTERIOR (CYSTOCELE) AND POSTERIOR REPAIR (RECTOCELE);  Surgeon: Daria Pastures, MD;  Location: [facility];  Service: Gynecology;  Laterality: N/A;  anterior repair   APPENDECTOMY     BLADDER SUSPENSION  06/24/2012   Procedure: TRANSVAGINAL TAPE (TVT) PROCEDURE;  Surgeon: Daria Pastures, MD;  Location: [facility];  Service: Gynecology;  Laterality: N/A;   CHOLECYSTECTOMY     CYSTOSCOPY  06/24/2012   Procedure: CYSTOSCOPY;  Surgeon: Daria Pastures, MD;  Location: [facility];  Service: Gynecology;  Laterality: N/A;   FOOT SURGERY     LAPAROSCOPY  06/24/2012   Procedure: LAPAROSCOPY OPERATIVE;  Surgeon: Daria Pastures, MD;  Location: [facility];  Service: Gynecology;  Laterality: N/A;   TUBAL LIGATION     VAGINAL HYSTERECTOMY  06/24/2012   Procedure: HYSTERECTOMY VAGINAL;  Surgeon: Daria Pastures, MD;  Location: [facility];  Service: Gynecology;  Laterality: N/A;    Allergies Codeine  Family History  Problem Relation Age of Onset   Heart disease Mother    Diabetes Mother     Social History Social History   Tobacco Use   Smoking status: Former Smoker    Last attempt to quit: 11/03/2017    Years since quitting: 1.2   Smokeless tobacco: Never Used   Tobacco comment: STATES SHE DOES NOT SMOKE.  Substance Use Topics   Alcohol use: No   Drug use: No    Review of Systems  Constitutional: No fever/chills Eyes: No visual changes. ENT: No sore throat. Cardiovascular: Denies chest pain. Respiratory: Denies shortness of breath. Gastrointestinal: Positive suprapubic abdominal pain.  No nausea, no vomiting.  No diarrhea.  No constipation. Genitourinary: Positive for dysuria. Positive urine frequency.  Musculoskeletal: Negative for back pain. Skin: Negative for rash. Neurological: Negative for headaches, focal weakness or numbness.  10-point ROS otherwise negative.  ____________________________________________   PHYSICAL EXAM:  VITAL SIGNS: ED Triage Vitals  Enc Vitals Group     BP 01/31/19 1417 (!) 166/79     Pulse Rate 01/31/19 1417 91  Resp 01/31/19 1417 18     Temp 01/31/19 1417 97.9 F (36.6 C)     Temp Source 01/31/19 1417 Oral     SpO2 01/31/19 1417 99 %     Weight 01/31/19 1418 210 lb (95.3 kg)     Height 01/31/19 1418 5\' 6"  (1.676 m)     Pain Score 01/31/19 1418 10   Constitutional: Alert and oriented. Well appearing and in no acute distress. Eyes: Conjunctivae are normal. Head: Atraumatic. Nose: No congestion/rhinnorhea. Mouth/Throat: Mucous membranes are moist. Neck: No stridor.   Cardiovascular:  Normal rate, regular rhythm. Good peripheral circulation. Grossly normal heart sounds.   Respiratory: Normal respiratory effort.  No retractions. Lungs CTAB. Gastrointestinal: Soft with mild suprapubic tenderness. No rebound or guarding. No CVA tenderness. No distention.  Musculoskeletal: No lower extremity tenderness nor edema. No gross deformities of extremities. Neurologic:  Normal speech and language. No gross focal neurologic deficits are appreciated.  Skin:  Skin is warm, dry and intact. No rash noted.   ____________________________________________   LABS (all labs ordered are listed, but only abnormal results are displayed)  Labs Reviewed  COMPREHENSIVE METABOLIC PANEL - Abnormal; Notable for the following components:      Result Value   Glucose, Bld 131 (*)    Calcium 8.8 (*)    All other components within normal limits  CBC WITH DIFFERENTIAL/PLATELET - Abnormal; Notable for the following components:   Monocytes Absolute 1.1 (*)    All other components within normal limits  URINALYSIS, ROUTINE W REFLEX MICROSCOPIC - Abnormal; Notable for the following components:   APPearance HAZY (*)    Hgb urine dipstick SMALL (*)    All other components within normal limits  URINE CULTURE  LIPASE, BLOOD   ____________________________________________  RADIOLOGY  Ct Renal Stone Study  Result Date: 01/31/2019 CLINICAL DATA:  Severe lower abdominal pain, flank pain side not specified, UTI for 2 weeks, difficulty urinating EXAM: CT ABDOMEN AND PELVIS WITHOUT CONTRAST TECHNIQUE: Multidetector CT imaging of the abdomen and pelvis was performed following the standard protocol without IV contrast. Sagittal and coronal MPR images reconstructed from axial data set. Oral contrast not administered for this indication. COMPARISON:  02/02/2009 FINDINGS: Lower chest: Lung bases clear Hepatobiliary: Gallbladder surgically absent. Liver normal appearance. Pancreas: Normal appearance Spleen: Normal  appearance Adrenals/Urinary Tract: LEFT adrenal nodule 9 x 8 mm consistent with adenoma. Larger RIGHT adrenal nodule 16 x 10 mm consistent with adenoma. LEFT kidney normal appearance. RIGHT hydronephrosis and hydroureter due to a 3 mm calculus at the ureterovesical junction. No renal masses or additional urinary tract calcification. LEFT ureter and bladder otherwise normal. Stomach/Bowel: Appendix surgically absent by history. Small hiatal hernia. Stomach and bowel loops otherwise normal appearance. Vascular/Lymphatic: Atherosclerotic calcifications aorta and iliac arteries. Small pelvic phleboliths. No adenopathy. Reproductive: Uterus surgically absent with atrophic ovaries. Other: No free air or free fluid.  No acute inflammatory process. Musculoskeletal: Osseous demineralization. IMPRESSION: RIGHT hydronephrosis and hydroureter secondary to a 3 mm calculus at the RIGHT ureterovesical junction. Small BILATERAL adrenal adenomas. Electronically Signed   By: Lavonia Dana M.D.   On: 01/31/2019 17:22    ____________________________________________   PROCEDURES  Procedure(s) performed:   Procedures  None  ____________________________________________   INITIAL IMPRESSION / ASSESSMENT AND PLAN / ED COURSE  Pertinent labs & imaging results that were available during my care of the patient were reviewed by me and considered in my medical decision making (see chart for details).   Patient presents to the emergency  department describing symptoms most consistent with UTI/cystitis.  She has been on Cipro for 3 days with no improvement.  Vital signs are unremarkable.  Very low suspicion for developing sepsis.  Given the report of blood on UA several days ago I will obtain a CT renal to rule out possible infected stone but history is less suggestive of this.  Patient denies any vaginal bleeding or discharge.  Plan for low-dose Toradol and reassess after imaging and labs.   CT shows small UVJ stone on the  right.  This correlates well with the patient's symptoms.  Advised that she continue Cipro as prescribed earlier.  No evidence of UTI here but question partially treated infection.  No fevers.  Patient remains awake and alert.  She has had intolerance of opiate medications in the past and pain is well controlled here.  Prescribed very short course of Toradol given her age and advised close follow-up with both her PCP and urology.  Phone numbers provided at discharge and she will call for appointments tomorrow. ____________________________________________  FINAL CLINICAL IMPRESSION(S) / ED DIAGNOSES  Final diagnoses:  Kidney stone     MEDICATIONS GIVEN DURING THIS VISIT:  Medications  ketorolac (TORADOL) 30 MG/ML injection 15 mg (15 mg Intravenous Given 01/31/19 1450)     NEW OUTPATIENT MEDICATIONS STARTED DURING THIS VISIT:  Discharge Medication List as of 01/31/2019  5:57 PM    START taking these medications   Details  ketorolac (TORADOL) 10 MG tablet Take 1 tablet (10 mg total) by mouth every 6 (six) hours as needed for severe pain., Starting Sun 01/31/2019, Print    tamsulosin (FLOMAX) 0.4 MG CAPS capsule Take 1 capsule (0.4 mg total) by mouth daily for 7 days., Starting Sun 01/31/2019, Until Sun 02/07/2019, Print        Note:  This document was prepared using Dragon voice recognition software and may include unintentional dictation errors.  Nanda Quinton, MD Emergency Medicine    Armonie Staten, [facility], MD 01/31/19 [phone]

## 2019-02-01 LAB — URINE CULTURE
Culture: 40000 — AB
Special Requests: NORMAL

## 2019-02-02 ENCOUNTER — Other Ambulatory Visit: Payer: Self-pay | Admitting: Urology

## 2019-02-02 DIAGNOSIS — R1084 Generalized abdominal pain: Secondary | ICD-10-CM | POA: Diagnosis not present

## 2019-02-02 DIAGNOSIS — N201 Calculus of ureter: Secondary | ICD-10-CM | POA: Diagnosis not present

## 2019-02-04 ENCOUNTER — Encounter (HOSPITAL_COMMUNITY): Payer: Self-pay | Admitting: Certified Registered Nurse Anesthetist

## 2019-02-04 DIAGNOSIS — N201 Calculus of ureter: Secondary | ICD-10-CM | POA: Diagnosis not present

## 2019-02-04 MED ORDER — GENTAMICIN SULFATE 40 MG/ML IJ SOLN
5.0000 mg/kg | INTRAVENOUS | Status: DC
  Filled 2019-02-04: qty 9.25

## 2019-02-04 NOTE — Progress Notes (Incomplete)
Patient states she has cancelled surgery for 02/05/2019.  Patient states she has told 4 people this andeven this office.  Called and [facility] for Darrel Reach that patient states surgery has been cancelled.

## 2019-02-05 ENCOUNTER — Ambulatory Visit (HOSPITAL_COMMUNITY): Admission: RE | Admit: 2019-02-05 | Payer: Medicare HMO | Source: Home / Self Care | Admitting: Urology

## 2019-02-05 ENCOUNTER — Encounter (HOSPITAL_COMMUNITY): Admission: RE | Payer: Self-pay | Source: Home / Self Care

## 2019-02-05 SURGERY — CYSTOURETEROSCOPY, WITH STENT INSERTION
Anesthesia: General | Laterality: Right

## 2019-02-17 ENCOUNTER — Ambulatory Visit: Payer: Self-pay | Admitting: Orthopaedic Surgery

## 2019-02-17 ENCOUNTER — Other Ambulatory Visit: Payer: Self-pay

## 2019-02-17 ENCOUNTER — Ambulatory Visit: Payer: Medicare HMO | Admitting: Orthopaedic Surgery

## 2019-02-17 DIAGNOSIS — S838X1A Sprain of other specified parts of right knee, initial encounter: Secondary | ICD-10-CM | POA: Diagnosis not present

## 2019-02-17 NOTE — Progress Notes (Signed)
Office Visit Note   Patient: Julie Miles           Date of Birth: 08/26/1951           MRN: [id number] Visit Date: 02/17/2019              Requested by: Sharilyn Sites, [address], [place] [address] PCP: Sharilyn Sites, MD   Assessment & Plan: Visit Diagnoses:  1. Acute medial meniscal injury of right knee, initial encounter     Plan: Once again I reviewed the MRI that she obtained last year on her right knee and it does show a medial meniscus tear.  My impression this is an acute medial meniscal tear that correlates with her symptoms.  She has failed conservative treatment with rest and cortisone injections.  She would like to proceed with arthroscopic partial medial meniscectomy.  She understands that given the presence of some chondromalacia in her medial compartment that she may have incomplete relief of pain or even some increased pain for short period after the surgery.  Risks and benefits and reasonable expectations for recovery were discussed today.  We will schedule her surgery in the near future.  Follow-Up Instructions: Return for 1 week postop visit.   Orders:  No orders of the defined types were placed in this encounter.  No orders of the defined types were placed in this encounter.     Procedures: No procedures performed   Clinical Data: No additional findings.   Subjective: No chief complaint on file.   Julie Miles is a 68 year old female who comes in today for repeat evaluation of her right knee pain.  She had an MRI last year which showed an acute right medial meniscus tear.  She is continued to have meniscal symptoms and she has pain along the medial joint line.  She is having mechanical symptoms that is worse with ambulation and activity.  The cortisone injection did not give her any significant relief.   Review of Systems  Constitutional: Negative.   HENT: Negative.   Eyes: Negative.   Respiratory: Negative.   Cardiovascular:  Negative.   Endocrine: Negative.   Musculoskeletal: Negative.   Neurological: Negative.   Hematological: Negative.   Psychiatric/Behavioral: Negative.   All other systems reviewed and are negative.    Objective: Vital Signs: There were no vitals taken for this visit.  Physical Exam Vitals signs and nursing note reviewed.  Constitutional:      Appearance: She is well-developed.  Pulmonary:     Effort: Pulmonary effort is normal.  Skin:    General: Skin is warm.     Capillary Refill: Capillary refill takes less than 2 seconds.  Neurological:     Mental Status: She is alert and oriented to person, place, and time.  Psychiatric:        Behavior: Behavior normal.        Thought Content: Thought content normal.        Judgment: Judgment normal.     Ortho Exam Right knee exam demonstrates a trace effusion.  She has exquisite medial joint line tenderness.  Pain with McMurray testing at the medial joint line.  Collaterals and cruciates are stable.  1+ patellofemoral crepitus. Specialty Comments:  No specialty comments available.  Imaging: No results found.   PMFS History: Patient Active Problem List   Diagnosis Date Noted  . Sciatica of left side 11/19/2018  . ANXIETY 07/20/2010  . HEMATOCHEZIA 07/20/2010   Past Medical History:  Diagnosis  Date  . Anxiety   . History of blood transfusion 1975   at [facility] after vag delivery  . Hypercholesterolemia   . PONV (postoperative nausea and vomiting)     Family History  Problem Relation Age of Onset  . Heart disease Mother   . Diabetes Mother     Past Surgical History:  Procedure Laterality Date  . ANTERIOR AND POSTERIOR REPAIR  06/24/2012   Procedure: ANTERIOR (CYSTOCELE) AND POSTERIOR REPAIR (RECTOCELE);  Surgeon: Daria Pastures, MD;  Location: [facility];  Service: Gynecology;  Laterality: N/A;  anterior repair  . APPENDECTOMY    . BLADDER SUSPENSION  06/24/2012   Procedure: TRANSVAGINAL TAPE (TVT)  PROCEDURE;  Surgeon: Daria Pastures, MD;  Location: [facility];  Service: Gynecology;  Laterality: N/A;  . CHOLECYSTECTOMY    . CYSTOSCOPY  06/24/2012   Procedure: CYSTOSCOPY;  Surgeon: Daria Pastures, MD;  Location: [facility];  Service: Gynecology;  Laterality: N/A;  . FOOT SURGERY    . LAPAROSCOPY  06/24/2012   Procedure: LAPAROSCOPY OPERATIVE;  Surgeon: Daria Pastures, MD;  Location: [facility];  Service: Gynecology;  Laterality: N/A;  . TUBAL LIGATION    . VAGINAL HYSTERECTOMY  06/24/2012   Procedure: HYSTERECTOMY VAGINAL;  Surgeon: Daria Pastures, MD;  Location: [facility];  Service: Gynecology;  Laterality: N/A;   Social History   Occupational History  . Not on file  Tobacco Use  . Smoking status: Former Smoker    Last attempt to quit: 11/03/2017    Years since quitting: 1.2  . Smokeless tobacco: Never Used  . Tobacco comment: STATES SHE DOES NOT SMOKE.  Substance and Sexual Activity  . Alcohol use: No  . Drug use: No  . Sexual activity: Not on file

## 2019-02-24 DIAGNOSIS — Z03818 Encounter for observation for suspected exposure to other biological agents ruled out: Secondary | ICD-10-CM | POA: Diagnosis not present

## 2019-02-25 ENCOUNTER — Encounter: Payer: Self-pay | Admitting: Orthopaedic Surgery

## 2019-02-25 ENCOUNTER — Other Ambulatory Visit: Payer: Self-pay | Admitting: Physician Assistant

## 2019-02-25 DIAGNOSIS — S83231A Complex tear of medial meniscus, current injury, right knee, initial encounter: Secondary | ICD-10-CM | POA: Diagnosis not present

## 2019-02-25 DIAGNOSIS — X58XXXA Exposure to other specified factors, initial encounter: Secondary | ICD-10-CM | POA: Diagnosis not present

## 2019-02-25 DIAGNOSIS — M23303 Other meniscus derangements, unspecified medial meniscus, right knee: Secondary | ICD-10-CM | POA: Diagnosis not present

## 2019-02-25 DIAGNOSIS — M659 Synovitis and tenosynovitis, unspecified: Secondary | ICD-10-CM | POA: Diagnosis not present

## 2019-02-25 MED ORDER — OXYCODONE-ACETAMINOPHEN 5-325 MG PO TABS: 1.0000 | ORAL_TABLET | Freq: Four times a day (QID) | ORAL | 0 refills | Status: DC | PRN

## 2019-02-25 MED ORDER — ONDANSETRON HCL 4 MG PO TABS: 4.0000 mg | ORAL_TABLET | Freq: Three times a day (TID) | ORAL | 0 refills | Status: DC | PRN

## 2019-02-25 MED ORDER — HYDROCODONE-ACETAMINOPHEN 5-325 MG PO TABS: 1.0000 | ORAL_TABLET | Freq: Four times a day (QID) | ORAL | 0 refills | Status: DC | PRN

## 2019-02-27 ENCOUNTER — Telehealth: Payer: Self-pay

## 2019-02-27 NOTE — Telephone Encounter (Signed)
Pt. Was contacted per phone.  Advised of poss. Exposure to COVID 19 at office visit with [facility] on 02/17/19.  Pt.. stated she just had a COVID 19 test on 02/24/19, as a pre-op test; has not received results yet.  No further action taken at this time.

## 2019-03-03 ENCOUNTER — Encounter: Payer: Self-pay | Admitting: Orthopaedic Surgery

## 2019-03-03 ENCOUNTER — Ambulatory Visit: Payer: Medicare HMO | Admitting: Orthopaedic Surgery

## 2019-03-03 ENCOUNTER — Other Ambulatory Visit: Payer: Self-pay

## 2019-03-03 DIAGNOSIS — Z9889 Other specified postprocedural states: Secondary | ICD-10-CM

## 2019-03-03 NOTE — Progress Notes (Signed)
Patient ID: [name], female   DOB: [birth year], 68 y.o.   MRN: [id number]  Julie Miles is 1 week status post right knee arthroscopy with partial medial meniscectomy.  She comes in for her first postoperative visit.  Overall she is doing well and reports no pain.  She states that the previous pain on the medial and the posterior aspect of the knee is completely gone.  She does have residual swelling from the surgery.  She has some light ecchymosis.  Surgical incisions well-healed.  She does have a moderate joint effusion.  She has really good range of motion.  Today we remove the sutures and placed Steri-Strips.  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  At this time we are going to send her to outpatient physical therapy for strengthening and home exercise program.  Questions encouraged and answered.  I like to recheck her in 5 weeks.

## 2019-03-09 DIAGNOSIS — M25661 Stiffness of right knee, not elsewhere classified: Secondary | ICD-10-CM | POA: Diagnosis not present

## 2019-03-09 DIAGNOSIS — E669 Obesity, unspecified: Secondary | ICD-10-CM | POA: Diagnosis not present

## 2019-03-09 DIAGNOSIS — R262 Difficulty in walking, not elsewhere classified: Secondary | ICD-10-CM | POA: Diagnosis not present

## 2019-03-09 DIAGNOSIS — M6281 Muscle weakness (generalized): Secondary | ICD-10-CM | POA: Diagnosis not present

## 2019-03-09 DIAGNOSIS — M25561 Pain in right knee: Secondary | ICD-10-CM | POA: Diagnosis not present

## 2019-03-09 DIAGNOSIS — M25461 Effusion, right knee: Secondary | ICD-10-CM | POA: Diagnosis not present

## 2019-03-12 DIAGNOSIS — M25561 Pain in right knee: Secondary | ICD-10-CM | POA: Diagnosis not present

## 2019-03-12 DIAGNOSIS — M6281 Muscle weakness (generalized): Secondary | ICD-10-CM | POA: Diagnosis not present

## 2019-03-12 DIAGNOSIS — M25461 Effusion, right knee: Secondary | ICD-10-CM | POA: Diagnosis not present

## 2019-03-12 DIAGNOSIS — E669 Obesity, unspecified: Secondary | ICD-10-CM | POA: Diagnosis not present

## 2019-03-12 DIAGNOSIS — M25661 Stiffness of right knee, not elsewhere classified: Secondary | ICD-10-CM | POA: Diagnosis not present

## 2019-03-12 DIAGNOSIS — R262 Difficulty in walking, not elsewhere classified: Secondary | ICD-10-CM | POA: Diagnosis not present

## 2019-03-16 DIAGNOSIS — M25461 Effusion, right knee: Secondary | ICD-10-CM | POA: Diagnosis not present

## 2019-03-16 DIAGNOSIS — R262 Difficulty in walking, not elsewhere classified: Secondary | ICD-10-CM | POA: Diagnosis not present

## 2019-03-16 DIAGNOSIS — M6281 Muscle weakness (generalized): Secondary | ICD-10-CM | POA: Diagnosis not present

## 2019-03-16 DIAGNOSIS — M25661 Stiffness of right knee, not elsewhere classified: Secondary | ICD-10-CM | POA: Diagnosis not present

## 2019-03-16 DIAGNOSIS — M25561 Pain in right knee: Secondary | ICD-10-CM | POA: Diagnosis not present

## 2019-03-16 DIAGNOSIS — E669 Obesity, unspecified: Secondary | ICD-10-CM | POA: Diagnosis not present

## 2019-03-17 DIAGNOSIS — M25561 Pain in right knee: Secondary | ICD-10-CM | POA: Diagnosis not present

## 2019-03-17 DIAGNOSIS — M25461 Effusion, right knee: Secondary | ICD-10-CM | POA: Diagnosis not present

## 2019-03-17 DIAGNOSIS — M25661 Stiffness of right knee, not elsewhere classified: Secondary | ICD-10-CM | POA: Diagnosis not present

## 2019-03-17 DIAGNOSIS — M6281 Muscle weakness (generalized): Secondary | ICD-10-CM | POA: Diagnosis not present

## 2019-03-17 DIAGNOSIS — E669 Obesity, unspecified: Secondary | ICD-10-CM | POA: Diagnosis not present

## 2019-03-17 DIAGNOSIS — R262 Difficulty in walking, not elsewhere classified: Secondary | ICD-10-CM | POA: Diagnosis not present

## 2019-03-24 DIAGNOSIS — M25561 Pain in right knee: Secondary | ICD-10-CM | POA: Diagnosis not present

## 2019-03-24 DIAGNOSIS — M25661 Stiffness of right knee, not elsewhere classified: Secondary | ICD-10-CM | POA: Diagnosis not present

## 2019-03-24 DIAGNOSIS — M25461 Effusion, right knee: Secondary | ICD-10-CM | POA: Diagnosis not present

## 2019-03-24 DIAGNOSIS — M6281 Muscle weakness (generalized): Secondary | ICD-10-CM | POA: Diagnosis not present

## 2019-03-24 DIAGNOSIS — R262 Difficulty in walking, not elsewhere classified: Secondary | ICD-10-CM | POA: Diagnosis not present

## 2019-03-24 DIAGNOSIS — E669 Obesity, unspecified: Secondary | ICD-10-CM | POA: Diagnosis not present

## 2019-03-25 DIAGNOSIS — M25661 Stiffness of right knee, not elsewhere classified: Secondary | ICD-10-CM | POA: Diagnosis not present

## 2019-03-25 DIAGNOSIS — M25461 Effusion, right knee: Secondary | ICD-10-CM | POA: Diagnosis not present

## 2019-03-25 DIAGNOSIS — E669 Obesity, unspecified: Secondary | ICD-10-CM | POA: Diagnosis not present

## 2019-03-25 DIAGNOSIS — R262 Difficulty in walking, not elsewhere classified: Secondary | ICD-10-CM | POA: Diagnosis not present

## 2019-03-25 DIAGNOSIS — M6281 Muscle weakness (generalized): Secondary | ICD-10-CM | POA: Diagnosis not present

## 2019-03-25 DIAGNOSIS — M25561 Pain in right knee: Secondary | ICD-10-CM | POA: Diagnosis not present

## 2019-03-26 DIAGNOSIS — E669 Obesity, unspecified: Secondary | ICD-10-CM | POA: Diagnosis not present

## 2019-03-26 DIAGNOSIS — M25661 Stiffness of right knee, not elsewhere classified: Secondary | ICD-10-CM | POA: Diagnosis not present

## 2019-03-26 DIAGNOSIS — M25461 Effusion, right knee: Secondary | ICD-10-CM | POA: Diagnosis not present

## 2019-03-26 DIAGNOSIS — R262 Difficulty in walking, not elsewhere classified: Secondary | ICD-10-CM | POA: Diagnosis not present

## 2019-03-26 DIAGNOSIS — M25561 Pain in right knee: Secondary | ICD-10-CM | POA: Diagnosis not present

## 2019-03-26 DIAGNOSIS — M6281 Muscle weakness (generalized): Secondary | ICD-10-CM | POA: Diagnosis not present

## 2019-03-29 DIAGNOSIS — E669 Obesity, unspecified: Secondary | ICD-10-CM | POA: Diagnosis not present

## 2019-03-29 DIAGNOSIS — M6281 Muscle weakness (generalized): Secondary | ICD-10-CM | POA: Diagnosis not present

## 2019-03-29 DIAGNOSIS — M25461 Effusion, right knee: Secondary | ICD-10-CM | POA: Diagnosis not present

## 2019-03-29 DIAGNOSIS — M25561 Pain in right knee: Secondary | ICD-10-CM | POA: Diagnosis not present

## 2019-03-29 DIAGNOSIS — R262 Difficulty in walking, not elsewhere classified: Secondary | ICD-10-CM | POA: Diagnosis not present

## 2019-03-29 DIAGNOSIS — M25661 Stiffness of right knee, not elsewhere classified: Secondary | ICD-10-CM | POA: Diagnosis not present

## 2019-04-07 ENCOUNTER — Encounter: Payer: Self-pay | Admitting: Orthopaedic Surgery

## 2019-04-07 ENCOUNTER — Ambulatory Visit (INDEPENDENT_AMBULATORY_CARE_PROVIDER_SITE_OTHER): Payer: Medicare HMO | Admitting: Physician Assistant

## 2019-04-07 ENCOUNTER — Other Ambulatory Visit: Payer: Self-pay

## 2019-04-07 DIAGNOSIS — Z9889 Other specified postprocedural states: Secondary | ICD-10-CM

## 2019-04-07 NOTE — Progress Notes (Signed)
   Post-Op Visit Note   Patient: Julie Miles           Date of Birth: 19-Mar-1951           MRN: [id number] Visit Date: 04/07/2019 PCP: Sharilyn Sites, MD   Assessment & Plan:  Chief Complaint:  Chief Complaint  Patient presents with  . Right Knee - Routine Post Op   Visit Diagnoses:  1. S/P right knee arthroscopy     Plan: Patient is a pleasant 68 year old female who presents our clinic today 6 weeks status post right knee arthroscopic partial medial meniscectomy.  It was noted during operative intervention she had grade 4 changes medial compartment and femoral trochlea.  She has finished formal physical therapy.  She has minimal to no pain.  Overall she is doing fantastic.  Examination of her right knee reveals fully healed surgical portals without evidence of infection or cellulitis.  Calf is soft nontender.  Range of motion 0 to 120 degrees.  She is neurovascularly intact distally.  At this point, she will continue with activity as tolerated.  She will follow-up with [place] as needed.  Call with concerns or questions in the meantime.  Follow-Up Instructions: Return if symptoms worsen or fail to improve.   Orders:  No orders of the defined types were placed in this encounter.  No orders of the defined types were placed in this encounter.   Imaging: No new imaging   PMFS History: Patient Active Problem List   Diagnosis Date Noted  . S/P right knee arthroscopy 03/03/2019  . Sciatica of left side 11/19/2018  . ANXIETY 07/20/2010  . HEMATOCHEZIA 07/20/2010   Past Medical History:  Diagnosis Date  . Anxiety   . History of blood transfusion 1975   at [facility] after vag delivery  . Hypercholesterolemia   . PONV (postoperative nausea and vomiting)     Family History  Problem Relation Age of Onset  . Heart disease Mother   . Diabetes Mother     Past Surgical History:  Procedure Laterality Date  . ANTERIOR AND POSTERIOR REPAIR  06/24/2012   Procedure:  ANTERIOR (CYSTOCELE) AND POSTERIOR REPAIR (RECTOCELE);  Surgeon: Daria Pastures, MD;  Location: [facility];  Service: Gynecology;  Laterality: N/A;  anterior repair  . APPENDECTOMY    . BLADDER SUSPENSION  06/24/2012   Procedure: TRANSVAGINAL TAPE (TVT) PROCEDURE;  Surgeon: Daria Pastures, MD;  Location: [facility];  Service: Gynecology;  Laterality: N/A;  . CHOLECYSTECTOMY    . CYSTOSCOPY  06/24/2012   Procedure: CYSTOSCOPY;  Surgeon: Daria Pastures, MD;  Location: [facility];  Service: Gynecology;  Laterality: N/A;  . FOOT SURGERY    . LAPAROSCOPY  06/24/2012   Procedure: LAPAROSCOPY OPERATIVE;  Surgeon: Daria Pastures, MD;  Location: [facility];  Service: Gynecology;  Laterality: N/A;  . TUBAL LIGATION    . VAGINAL HYSTERECTOMY  06/24/2012   Procedure: HYSTERECTOMY VAGINAL;  Surgeon: Daria Pastures, MD;  Location: [facility];  Service: Gynecology;  Laterality: N/A;   Social History   Occupational History  . Not on file  Tobacco Use  . Smoking status: Former Smoker    Quit date: 11/03/2017    Years since quitting: 1.4  . Smokeless tobacco: Never Used  . Tobacco comment: STATES SHE DOES NOT SMOKE.  Substance and Sexual Activity  . Alcohol use: No  . Drug use: No  . Sexual activity: Not on file

## 2019-05-24 DIAGNOSIS — X32XXXD Exposure to sunlight, subsequent encounter: Secondary | ICD-10-CM | POA: Diagnosis not present

## 2019-05-24 DIAGNOSIS — L820 Inflamed seborrheic keratosis: Secondary | ICD-10-CM | POA: Diagnosis not present

## 2019-05-24 DIAGNOSIS — L570 Actinic keratosis: Secondary | ICD-10-CM | POA: Diagnosis not present

## 2019-05-31 DIAGNOSIS — M779 Enthesopathy, unspecified: Secondary | ICD-10-CM | POA: Diagnosis not present

## 2019-05-31 DIAGNOSIS — E039 Hypothyroidism, unspecified: Secondary | ICD-10-CM | POA: Diagnosis not present

## 2019-05-31 DIAGNOSIS — E782 Mixed hyperlipidemia: Secondary | ICD-10-CM | POA: Diagnosis not present

## 2019-06-22 DIAGNOSIS — H524 Presbyopia: Secondary | ICD-10-CM | POA: Diagnosis not present

## 2019-06-28 DIAGNOSIS — R69 Illness, unspecified: Secondary | ICD-10-CM | POA: Diagnosis not present

## 2019-07-31 DIAGNOSIS — E7849 Other hyperlipidemia: Secondary | ICD-10-CM | POA: Diagnosis not present

## 2019-07-31 DIAGNOSIS — R69 Illness, unspecified: Secondary | ICD-10-CM | POA: Diagnosis not present

## 2019-08-04 ENCOUNTER — Other Ambulatory Visit: Payer: Self-pay

## 2019-08-04 DIAGNOSIS — Z20822 Contact with and (suspected) exposure to covid-19: Secondary | ICD-10-CM

## 2019-08-06 LAB — NOVEL CORONAVIRUS, NAA: SARS-CoV-2, NAA: NOT DETECTED

## 2019-08-10 ENCOUNTER — Telehealth: Payer: Self-pay | Admitting: *Deleted

## 2019-08-10 NOTE — Telephone Encounter (Signed)
Pt notified of negative covid-19 results. She voiced understanding.

## 2019-08-20 DIAGNOSIS — R69 Illness, unspecified: Secondary | ICD-10-CM | POA: Diagnosis not present

## 2019-09-09 DIAGNOSIS — Z03818 Encounter for observation for suspected exposure to other biological agents ruled out: Secondary | ICD-10-CM | POA: Diagnosis not present

## 2019-09-14 DIAGNOSIS — Z03818 Encounter for observation for suspected exposure to other biological agents ruled out: Secondary | ICD-10-CM | POA: Diagnosis not present

## 2019-09-16 DIAGNOSIS — Z03818 Encounter for observation for suspected exposure to other biological agents ruled out: Secondary | ICD-10-CM | POA: Diagnosis not present

## 2019-09-21 DIAGNOSIS — Z03818 Encounter for observation for suspected exposure to other biological agents ruled out: Secondary | ICD-10-CM | POA: Diagnosis not present

## 2019-09-28 DIAGNOSIS — Z20828 Contact with and (suspected) exposure to other viral communicable diseases: Secondary | ICD-10-CM | POA: Diagnosis not present

## 2019-09-30 DIAGNOSIS — Z20828 Contact with and (suspected) exposure to other viral communicable diseases: Secondary | ICD-10-CM | POA: Diagnosis not present

## 2019-10-05 DIAGNOSIS — Z23 Encounter for immunization: Secondary | ICD-10-CM | POA: Diagnosis not present

## 2019-10-13 DIAGNOSIS — E6609 Other obesity due to excess calories: Secondary | ICD-10-CM | POA: Diagnosis not present

## 2019-10-13 DIAGNOSIS — Z1389 Encounter for screening for other disorder: Secondary | ICD-10-CM | POA: Diagnosis not present

## 2019-10-13 DIAGNOSIS — J302 Other seasonal allergic rhinitis: Secondary | ICD-10-CM | POA: Diagnosis not present

## 2019-10-13 DIAGNOSIS — Z0001 Encounter for general adult medical examination with abnormal findings: Secondary | ICD-10-CM | POA: Diagnosis not present

## 2019-10-13 DIAGNOSIS — Z6835 Body mass index (BMI) 35.0-35.9, adult: Secondary | ICD-10-CM | POA: Diagnosis not present

## 2019-10-13 DIAGNOSIS — E7849 Other hyperlipidemia: Secondary | ICD-10-CM | POA: Diagnosis not present

## 2019-10-13 DIAGNOSIS — E039 Hypothyroidism, unspecified: Secondary | ICD-10-CM | POA: Diagnosis not present

## 2019-10-13 DIAGNOSIS — R69 Illness, unspecified: Secondary | ICD-10-CM | POA: Diagnosis not present

## 2019-10-13 DIAGNOSIS — R7309 Other abnormal glucose: Secondary | ICD-10-CM | POA: Diagnosis not present

## 2019-10-31 DIAGNOSIS — E7849 Other hyperlipidemia: Secondary | ICD-10-CM | POA: Diagnosis not present

## 2019-10-31 DIAGNOSIS — E039 Hypothyroidism, unspecified: Secondary | ICD-10-CM | POA: Diagnosis not present

## 2019-11-02 DIAGNOSIS — Z23 Encounter for immunization: Secondary | ICD-10-CM | POA: Diagnosis not present

## 2019-11-10 DIAGNOSIS — Z20828 Contact with and (suspected) exposure to other viral communicable diseases: Secondary | ICD-10-CM | POA: Diagnosis not present

## 2019-11-17 DIAGNOSIS — Z20828 Contact with and (suspected) exposure to other viral communicable diseases: Secondary | ICD-10-CM | POA: Diagnosis not present

## 2019-11-24 DIAGNOSIS — Z20828 Contact with and (suspected) exposure to other viral communicable diseases: Secondary | ICD-10-CM | POA: Diagnosis not present

## 2019-11-28 DIAGNOSIS — E039 Hypothyroidism, unspecified: Secondary | ICD-10-CM | POA: Diagnosis not present

## 2019-11-28 DIAGNOSIS — E7849 Other hyperlipidemia: Secondary | ICD-10-CM | POA: Diagnosis not present

## 2020-01-21 ENCOUNTER — Other Ambulatory Visit (HOSPITAL_COMMUNITY): Payer: Self-pay | Admitting: Family Medicine

## 2020-01-21 DIAGNOSIS — Z1231 Encounter for screening mammogram for malignant neoplasm of breast: Secondary | ICD-10-CM

## 2020-01-26 ENCOUNTER — Ambulatory Visit (HOSPITAL_COMMUNITY): Payer: Medicare HMO

## 2020-01-28 DIAGNOSIS — E039 Hypothyroidism, unspecified: Secondary | ICD-10-CM | POA: Diagnosis not present

## 2020-01-28 DIAGNOSIS — E7849 Other hyperlipidemia: Secondary | ICD-10-CM | POA: Diagnosis not present

## 2020-02-10 DIAGNOSIS — M1711 Unilateral primary osteoarthritis, right knee: Secondary | ICD-10-CM | POA: Insufficient documentation

## 2020-02-11 ENCOUNTER — Ambulatory Visit (INDEPENDENT_AMBULATORY_CARE_PROVIDER_SITE_OTHER): Payer: Medicare HMO

## 2020-02-11 ENCOUNTER — Other Ambulatory Visit: Payer: Self-pay

## 2020-02-11 ENCOUNTER — Encounter: Payer: Self-pay | Admitting: Orthopaedic Surgery

## 2020-02-11 ENCOUNTER — Ambulatory Visit: Payer: Medicare HMO | Admitting: Orthopaedic Surgery

## 2020-02-11 VITALS — Ht 66.0 in | Wt 220.2 lb

## 2020-02-11 DIAGNOSIS — Z9889 Other specified postprocedural states: Secondary | ICD-10-CM

## 2020-02-11 DIAGNOSIS — M1711 Unilateral primary osteoarthritis, right knee: Secondary | ICD-10-CM | POA: Diagnosis not present

## 2020-02-12 NOTE — Progress Notes (Signed)
Office Visit Note   Patient: Julie Miles           Date of Birth: [birth year]           MRN: [id number] Visit Date: 02/11/2020              Requested by: Sharilyn Sites, [place],  [place] [address] PCP: Sharilyn Sites, MD   Assessment & Plan: Visit Diagnoses:  1. Primary osteoarthritis of right knee   2. S/P right knee arthroscopy     Plan: Impression is end-stage right knee DJD.  Unfortunately she has not received much relief from cortisone injections and other forms of conservative treatment and based on our discussion of the risk benefits alternatives to right knee replacement she has elected to proceed with scheduling for this.  We look forward to treating her in the operating room.  Questions encouraged and answered.  Follow-Up Instructions: Return for 2 week postop visit.   Orders:  Orders Placed This Encounter  Procedures  . XR Knee 1-2 Views Right   No orders of the defined types were placed in this encounter.     Procedures: No procedures performed   Clinical Data: No additional findings.   Subjective: Chief Complaint  Patient presents with  . Right Knee - Pain    Julie Miles is a longtime patient of mine.  She comes in today for chronic right knee pain due to her DJD.  She is no longer getting much relief from medications or injections.  She has tried opiate pain medications which she could not tolerate the side effects.   Review of Systems  Constitutional: Negative.   HENT: Negative.   Eyes: Negative.   Respiratory: Negative.   Cardiovascular: Negative.   Endocrine: Negative.   Musculoskeletal: Negative.   Neurological: Negative.   Hematological: Negative.   Psychiatric/Behavioral: Negative.   All other systems reviewed and are negative.    Objective: Vital Signs: Ht 5\' 6"  (1.676 m)   Wt 220 lb 3.2 oz (99.9 kg)   BMI 35.54 kg/m   Physical Exam Vitals and nursing note reviewed.  Constitutional:      Appearance:  She is well-developed.  Pulmonary:     Effort: Pulmonary effort is normal.  Skin:    General: Skin is warm.     Capillary Refill: Capillary refill takes less than 2 seconds.  Neurological:     Mental Status: She is alert and oriented to person, place, and time.  Psychiatric:        Behavior: Behavior normal.        Thought Content: Thought content normal.        Judgment: Judgment normal.     Ortho Exam Right knee shows a trace joint effusion.  Significant patellofemoral crepitus.  Varus deformity.  Moderate limitation range of motion with moderate pain. Specialty Comments:  No specialty comments available.  Imaging: XR Knee 1-2 Views Right  Result Date: 02/12/2020 Advanced DJD    PMFS History: Patient Active Problem List   Diagnosis Date Noted  . Primary osteoarthritis of right knee 02/10/2020  . S/P right knee arthroscopy 03/03/2019  . Sciatica of left side 11/19/2018  . ANXIETY 07/20/2010  . HEMATOCHEZIA 07/20/2010   Past Medical History:  Diagnosis Date  . Anxiety   . History of blood transfusion 1975   at [facility] after vag delivery  . Hypercholesterolemia   . PONV (postoperative nausea and vomiting)     Family History  Problem  Relation Age of Onset  . Heart disease Mother   . Diabetes Mother     Past Surgical History:  Procedure Laterality Date  . ANTERIOR AND POSTERIOR REPAIR  06/24/2012   Procedure: ANTERIOR (CYSTOCELE) AND POSTERIOR REPAIR (RECTOCELE);  Surgeon: Daria Pastures, MD;  Location: [facility];  Service: Gynecology;  Laterality: N/A;  anterior repair  . APPENDECTOMY    . BLADDER SUSPENSION  06/24/2012   Procedure: TRANSVAGINAL TAPE (TVT) PROCEDURE;  Surgeon: Daria Pastures, MD;  Location: [facility];  Service: Gynecology;  Laterality: N/A;  . CHOLECYSTECTOMY    . CYSTOSCOPY  06/24/2012   Procedure: CYSTOSCOPY;  Surgeon: Daria Pastures, MD;  Location: [facility];  Service: Gynecology;  Laterality: N/A;  . FOOT SURGERY    .  LAPAROSCOPY  06/24/2012   Procedure: LAPAROSCOPY OPERATIVE;  Surgeon: Daria Pastures, MD;  Location: [facility];  Service: Gynecology;  Laterality: N/A;  . TUBAL LIGATION    . VAGINAL HYSTERECTOMY  06/24/2012   Procedure: HYSTERECTOMY VAGINAL;  Surgeon: Daria Pastures, MD;  Location: [facility];  Service: Gynecology;  Laterality: N/A;   Social History   Occupational History  . Not on file  Tobacco Use  . Smoking status: Former Smoker    Quit date: 11/03/2017    Years since quitting: 2.2  . Smokeless tobacco: Never Used  . Tobacco comment: STATES SHE DOES NOT SMOKE.  Substance and Sexual Activity  . Alcohol use: No  . Drug use: No  . Sexual activity: Not on file

## 2020-02-29 ENCOUNTER — Other Ambulatory Visit: Payer: Self-pay

## 2020-03-03 ENCOUNTER — Other Ambulatory Visit: Payer: Self-pay | Admitting: Family

## 2020-03-06 DIAGNOSIS — E039 Hypothyroidism, unspecified: Secondary | ICD-10-CM | POA: Diagnosis not present

## 2020-03-06 DIAGNOSIS — E7849 Other hyperlipidemia: Secondary | ICD-10-CM | POA: Diagnosis not present

## 2020-03-06 DIAGNOSIS — R69 Illness, unspecified: Secondary | ICD-10-CM | POA: Diagnosis not present

## 2020-03-06 DIAGNOSIS — E6609 Other obesity due to excess calories: Secondary | ICD-10-CM | POA: Diagnosis not present

## 2020-03-06 DIAGNOSIS — Z6836 Body mass index (BMI) 36.0-36.9, adult: Secondary | ICD-10-CM | POA: Diagnosis not present

## 2020-03-06 DIAGNOSIS — R7309 Other abnormal glucose: Secondary | ICD-10-CM | POA: Diagnosis not present

## 2020-03-07 NOTE — Progress Notes (Signed)
[place], [place] [address] Phone: [phone] Fax: [phone]  [place] [address] [place] - [place] [address] [place] [address] Phone: [phone] Fax: [phone]      Your procedure is scheduled on Monday 03/13/2020.  Report to [facility] Main Entrance "A" at 10:45 A.M., and check in at the Admitting office.  Call this number if you have problems, questions or concerns:  [phone]    Remember:  Do not eat after midnight the night before your surgery  You may drink clear liquids until 09:45am the morning of your surgery.   Clear liquids allowed are: Water, Non-Citrus Juices (without pulp), Carbonated Beverages, Clear Tea, Black Coffee Only, and Gatorade   Enhanced Recovery after Surgery for Orthopedics Enhanced Recovery after Surgery is a protocol used to improve the stress on your body and your recovery after surgery.  Patient Instructions  . The night before surgery:  o No food after midnight. ONLY clear liquids after midnight  .  Marland Kitchen The day of surgery (if you do NOT have diabetes):  o Drink ONE (1) Pre-Surgery Clear Ensure as directed.   o This drink was given to you during your hospital  pre-op appointment visit. o The pre-op nurse will instruct you on the time to drink the  Pre-Surgery Ensure depending on your surgery time. o Finish the drink at the designated time by the pre-op nurse. - complete your Ensure by 09:45am the morning of surgery.  o Nothing else to drink after completing the  Pre-Surgery Clear Ensure.         If you have questions, please contact your surgeon's office.     Take these medicines the morning of surgery with A SIP OF WATER: Levothyroxine (Synthroid, Levothroid) Simvastatin (Zocor)  As of today, STOP taking any Aspirin (unless otherwise instructed by your surgeon) and Aspirin containing products, Aleve, Naproxen, Ibuprofen, Motrin, Advil, Goody's,  BC's, all herbal medications, fish oil, and all vitamins.                      Do not wear jewelry, make up, or nail polish            Do not wear lotions, powders, perfumes, or deodorant.            Do not shave 48 hours prior to surgery.             Do not bring valuables to the hospital.            [facility] is not responsible for any belongings or valuables.  Do NOT Smoke (Tobacco/Vapping) or drink Alcohol 24 hours prior to your procedure  If you use a CPAP at night, you may bring all equipment for your overnight stay.   Contacts, glasses, dentures or bridgework may not be worn into surgery.      For patients admitted to the hospital, discharge time will be determined by your treatment team.   Patients discharged the day of surgery will not be allowed to drive home, and someone needs to stay with them for 24 hours.    Special instructions:   [facility]- Preparing For Surgery  Before surgery, you can play an important role. Because skin is not sterile, your skin needs to be as free of germs as possible. You can reduce the number of germs on your skin by washing with CHG (chlorahexidine gluconate) Soap before surgery.  CHG is an antiseptic cleaner which kills germs and bonds with the skin to continue killing germs even after washing.    Oral Hygiene is also important to reduce your risk of infection.  Remember - BRUSH YOUR TEETH THE MORNING OF SURGERY WITH YOUR REGULAR TOOTHPASTE  Please do not use if you have an allergy to CHG or antibacterial soaps. If your skin becomes reddened/irritated stop using the CHG.  Do not shave (including legs and underarms) for at least 48 hours prior to first CHG shower. It is OK to shave your face.  Please follow these instructions carefully.   1. Shower the NIGHT BEFORE SURGERY and the MORNING OF SURGERY with CHG Soap.   2. If you chose to wash your hair, wash your hair first as usual with your normal shampoo.  3. After you shampoo, rinse  your hair and body thoroughly to remove the shampoo.  4. Use CHG as you would any other liquid soap. You can apply CHG directly to the skin and wash gently with a scrungie or a clean washcloth.   5. Apply the CHG Soap to your body ONLY FROM THE NECK DOWN.  Do not use on open wounds or open sores. Avoid contact with your eyes, ears, mouth and genitals (private parts). Wash Face and genitals (private parts)  with your normal soap.   6. Wash thoroughly, paying special attention to the area where your surgery will be performed.  7. Thoroughly rinse your body with warm water from the neck down.  8. DO NOT shower/wash with your normal soap after using and rinsing off the CHG Soap.  9. Pat yourself dry with a CLEAN TOWEL.  10. Wear CLEAN PAJAMAS to bed the night before surgery.  11. Place CLEAN SHEETS on your bed the night of your first shower and DO NOT SLEEP WITH PETS.   Day of Surgery: Shower with CHG soap as directed Do not apply any deodorants/lotions.  Please wear clean clothes to the hospital/surgery center.   Remember to brush your teeth WITH YOUR REGULAR TOOTHPASTE.   Please read over the following fact sheets that you were given.

## 2020-03-08 ENCOUNTER — Encounter (HOSPITAL_COMMUNITY)
Admission: RE | Admit: 2020-03-08 | Discharge: 2020-03-08 | Disposition: A | Discharge status: Home / Self Care | Payer: Medicare HMO | Source: Ambulatory Visit | Attending: Orthopaedic Surgery | Admitting: Orthopaedic Surgery

## 2020-03-08 ENCOUNTER — Encounter (HOSPITAL_COMMUNITY): Payer: Self-pay

## 2020-03-08 ENCOUNTER — Ambulatory Visit (HOSPITAL_COMMUNITY)
Admission: RE | Admit: 2020-03-08 | Discharge: 2020-03-08 | Disposition: A | Discharge status: Home / Self Care | Payer: Medicare HMO | Source: Ambulatory Visit | Attending: Family | Admitting: Family

## 2020-03-08 ENCOUNTER — Other Ambulatory Visit: Payer: Self-pay

## 2020-03-08 DIAGNOSIS — R9431 Abnormal electrocardiogram [ECG] [EKG]: Secondary | ICD-10-CM | POA: Insufficient documentation

## 2020-03-08 DIAGNOSIS — Z01811 Encounter for preprocedural respiratory examination: Secondary | ICD-10-CM | POA: Diagnosis not present

## 2020-03-08 DIAGNOSIS — A4902 Methicillin resistant Staphylococcus aureus infection, unspecified site: Secondary | ICD-10-CM

## 2020-03-08 DIAGNOSIS — Z01818 Encounter for other preprocedural examination: Secondary | ICD-10-CM | POA: Diagnosis not present

## 2020-03-08 HISTORY — DX: Hypothyroidism, unspecified: E03.9

## 2020-03-08 HISTORY — DX: Methicillin resistant Staphylococcus aureus infection, unspecified site: A49.02

## 2020-03-08 HISTORY — DX: Personal history of urinary calculi: Z87.442

## 2020-03-08 LAB — COMPREHENSIVE METABOLIC PANEL
ALT: 31 U/L (ref 0–44)
AST: 29 U/L (ref 15–41)
Albumin: 3.8 g/dL (ref 3.5–5.0)
Alkaline Phosphatase: 94 U/L (ref 38–126)
Anion gap: 13 (ref 5–15)
BUN: 12 mg/dL (ref 8–23)
CO2: 24 mmol/L (ref 22–32)
Calcium: 8.9 mg/dL (ref 8.9–10.3)
Chloride: 104 mmol/L (ref 98–111)
Creatinine, Ser: 0.79 mg/dL (ref 0.44–1.00)
GFR calc Af Amer: >60 mL/min (ref >60)
GFR calc non Af Amer: >60 mL/min (ref >60)
Glucose, Bld: 117 mg/dL — ABNORMAL HIGH (ref 70–99)
Potassium: 3.7 mmol/L (ref 3.5–5.1)
Sodium: 141 mmol/L (ref 135–145)
Total Bilirubin: 0.8 mg/dL (ref 0.3–1.2)
Total Protein: 7 g/dL (ref 6.5–8.1)

## 2020-03-08 LAB — URINALYSIS, ROUTINE W REFLEX MICROSCOPIC
Bacteria, UA: NONE SEEN
Bilirubin Urine: NEGATIVE
Glucose, UA: NEGATIVE mg/dL
Ketones, ur: NEGATIVE mg/dL
Leukocytes,Ua: NEGATIVE
Nitrite: NEGATIVE
Protein, ur: NEGATIVE mg/dL
Specific Gravity, Urine: 1.02 (ref 1.005–1.030)
pH: 5 (ref 5.0–8.0)

## 2020-03-08 LAB — CBC
HCT: 44.9 % (ref 36.0–46.0)
Hemoglobin: 14.4 g/dL (ref 12.0–15.0)
MCH: 30.1 pg (ref 26.0–34.0)
MCHC: 32.1 g/dL (ref 30.0–36.0)
MCV: 93.9 fL (ref 80.0–100.0)
Platelets: 296 10*3/uL (ref 150–400)
RBC: 4.78 MIL/uL (ref 3.87–5.11)
RDW: 12 % (ref 11.5–15.5)
WBC: 6.1 10*3/uL (ref 4.0–10.5)
nRBC: 0 % (ref 0.0–0.2)

## 2020-03-08 LAB — SURGICAL PCR SCREEN
MRSA, PCR: POSITIVE — AB
Staphylococcus aureus: POSITIVE — AB

## 2020-03-08 LAB — PROTIME-INR
INR: 1 (ref 0.8–1.2)
Prothrombin Time: 12.6 seconds (ref 11.4–15.2)

## 2020-03-08 LAB — APTT: aPTT: 27 seconds (ref 24–36)

## 2020-03-08 NOTE — Progress Notes (Signed)
Pt made aware of the MRSA/Staph results, as requested.

## 2020-03-08 NOTE — Progress Notes (Addendum)
LVM for Julie Miles from Dr. Phoebe Sharps office informing her of the + MRSA/Staph results.

## 2020-03-08 NOTE — Progress Notes (Signed)
PCP - Dr. Hilma Favors Cardiologist - patient denies  PPM/ICD - n/a Device Orders -  Rep Notified -   Chest x-ray - 03/08/20 EKG - 03/08/20 Stress Test - patient denies ECHO - patient denies Cardiac Cath - patient denies  Sleep Study - patient denies CPAP -   Fasting Blood Sugar - n/a Checks Blood Sugar _____ times a day  Blood Thinner Instructions: n/a Aspirin Instructions: n/a  ERAS Protcol - Clears until 0945 PRE-SURGERY Ensure or G2- Ensure provided to patient at PAT appointment, complete by 0945 morning of surgery  COVID TEST- scheduled for Friday 03/10/20   Anesthesia review: yes, abnormal EKG  Patient denies shortness of breath, fever, cough and chest pain at PAT appointment   All instructions explained to the patient, with a verbal understanding of the material. Patient agrees to go over the instructions while at home for a better understanding. Patient also instructed to self quarantine after being tested for COVID-19. The opportunity to ask questions was provided.

## 2020-03-08 NOTE — Progress Notes (Signed)
Can someone send in mupirocin ointment for her nose as well as add vancomycin to be started in short stay please.  Thanks.

## 2020-03-09 ENCOUNTER — Telehealth: Payer: Self-pay

## 2020-03-09 ENCOUNTER — Other Ambulatory Visit: Payer: Self-pay

## 2020-03-09 MED ORDER — MUPIROCIN 2 % EX OINT: 1.0000 "application " | TOPICAL_OINTMENT | Freq: Two times a day (BID) | CUTANEOUS | 0 refills | Status: DC

## 2020-03-09 MED ORDER — TRAMADOL HCL 50 MG PO TABS: 50.0000 mg | ORAL_TABLET | Freq: Every day | ORAL | 0 refills | Status: DC | PRN

## 2020-03-09 NOTE — Addendum Note (Signed)
Addended by: Azucena Cecil on: 03/09/2020 03:44 PM   Modules accepted: Orders

## 2020-03-09 NOTE — Telephone Encounter (Signed)
Would like tramadol for her pain med. States she cannot take anything with Codiene.  Uses [facility].   CB [id number]

## 2020-03-10 ENCOUNTER — Other Ambulatory Visit: Payer: Self-pay | Admitting: Family

## 2020-03-10 ENCOUNTER — Telehealth: Payer: Self-pay | Admitting: Family

## 2020-03-10 ENCOUNTER — Other Ambulatory Visit (HOSPITAL_COMMUNITY)
Admission: RE | Admit: 2020-03-10 | Discharge: 2020-03-10 | Disposition: A | Discharge status: Home / Self Care | Payer: Medicare HMO | Source: Ambulatory Visit | Attending: Orthopaedic Surgery | Admitting: Orthopaedic Surgery

## 2020-03-10 DIAGNOSIS — Z20822 Contact with and (suspected) exposure to covid-19: Secondary | ICD-10-CM | POA: Insufficient documentation

## 2020-03-10 DIAGNOSIS — Z01812 Encounter for preprocedural laboratory examination: Secondary | ICD-10-CM | POA: Insufficient documentation

## 2020-03-10 LAB — SARS CORONAVIRUS 2 (TAT 6-24 HRS): SARS Coronavirus 2: NEGATIVE

## 2020-03-10 MED ORDER — TRANEXAMIC ACID 1000 MG/10ML IV SOLN
2000.0000 mg | INTRAVENOUS | Status: AC
  Administered 2020-03-13: 2000 mg via TOPICAL
  Filled 2020-03-10: qty 20

## 2020-03-10 MED ORDER — BUPIVACAINE LIPOSOME 1.3 % IJ SUSP
20.0000 mL | Freq: Once | INTRAMUSCULAR | Status: DC
  Filled 2020-03-10: qty 20

## 2020-03-10 NOTE — Telephone Encounter (Signed)
-----   Message from Leandrew Koyanagi, MD sent at 03/08/2020  4:15 PM EDT ----- Can someone send in mupirocin ointment for her nose as well as add vancomycin to be started in short stay please.  Thanks.

## 2020-03-12 NOTE — Discharge Instructions (Signed)

## 2020-03-13 ENCOUNTER — Encounter (HOSPITAL_COMMUNITY): Payer: Self-pay | Admitting: Orthopaedic Surgery

## 2020-03-13 ENCOUNTER — Observation Stay (HOSPITAL_COMMUNITY)
Admission: RE | Admit: 2020-03-13 | Discharge: 2020-03-14 | Disposition: A | Discharge status: Home / Self Care | Payer: Medicare HMO | Attending: Orthopaedic Surgery | Admitting: Orthopaedic Surgery

## 2020-03-13 ENCOUNTER — Ambulatory Visit (HOSPITAL_COMMUNITY): Payer: Medicare HMO | Admitting: Anesthesiology

## 2020-03-13 ENCOUNTER — Ambulatory Visit (HOSPITAL_COMMUNITY): Payer: Medicare HMO | Admitting: Physician Assistant

## 2020-03-13 ENCOUNTER — Other Ambulatory Visit: Payer: Self-pay

## 2020-03-13 ENCOUNTER — Observation Stay (HOSPITAL_COMMUNITY): Payer: Medicare HMO

## 2020-03-13 ENCOUNTER — Encounter (HOSPITAL_COMMUNITY)
Admission: RE | Disposition: A | Discharge status: Home / Self Care | Payer: Self-pay | Source: Home / Self Care | Attending: Orthopaedic Surgery

## 2020-03-13 DIAGNOSIS — F419 Anxiety disorder, unspecified: Secondary | ICD-10-CM | POA: Diagnosis not present

## 2020-03-13 DIAGNOSIS — Z87891 Personal history of nicotine dependence: Secondary | ICD-10-CM | POA: Diagnosis not present

## 2020-03-13 DIAGNOSIS — E039 Hypothyroidism, unspecified: Secondary | ICD-10-CM | POA: Diagnosis not present

## 2020-03-13 DIAGNOSIS — M25761 Osteophyte, right knee: Secondary | ICD-10-CM | POA: Insufficient documentation

## 2020-03-13 DIAGNOSIS — Z96651 Presence of right artificial knee joint: Secondary | ICD-10-CM | POA: Diagnosis not present

## 2020-03-13 DIAGNOSIS — Z8614 Personal history of Methicillin resistant Staphylococcus aureus infection: Secondary | ICD-10-CM | POA: Insufficient documentation

## 2020-03-13 DIAGNOSIS — M1711 Unilateral primary osteoarthritis, right knee: Secondary | ICD-10-CM | POA: Diagnosis not present

## 2020-03-13 DIAGNOSIS — D62 Acute posthemorrhagic anemia: Secondary | ICD-10-CM | POA: Diagnosis not present

## 2020-03-13 DIAGNOSIS — Z885 Allergy status to narcotic agent status: Secondary | ICD-10-CM | POA: Diagnosis not present

## 2020-03-13 DIAGNOSIS — E7800 Pure hypercholesterolemia, unspecified: Secondary | ICD-10-CM | POA: Diagnosis not present

## 2020-03-13 DIAGNOSIS — R69 Illness, unspecified: Secondary | ICD-10-CM | POA: Diagnosis not present

## 2020-03-13 DIAGNOSIS — Z79899 Other long term (current) drug therapy: Secondary | ICD-10-CM | POA: Diagnosis not present

## 2020-03-13 DIAGNOSIS — G8918 Other acute postprocedural pain: Secondary | ICD-10-CM | POA: Diagnosis not present

## 2020-03-13 DIAGNOSIS — Z471 Aftercare following joint replacement surgery: Secondary | ICD-10-CM | POA: Diagnosis not present

## 2020-03-13 HISTORY — PX: TOTAL KNEE ARTHROPLASTY: SHX125

## 2020-03-13 SURGERY — ARTHROPLASTY, KNEE, TOTAL
Anesthesia: Monitor Anesthesia Care | Site: Knee | Laterality: Right

## 2020-03-13 MED ORDER — KETOROLAC TROMETHAMINE 15 MG/ML IJ SOLN
30.0000 mg | Freq: Four times a day (QID) | INTRAMUSCULAR | Status: AC
  Administered 2020-03-13 – 2020-03-14 (×4): 30 mg via INTRAVENOUS
  Filled 2020-03-13 (×4): qty 2

## 2020-03-13 MED ORDER — CEFAZOLIN SODIUM-DEXTROSE 2-4 GM/100ML-% IV SOLN
2.0000 g | INTRAVENOUS | Status: AC
  Administered 2020-03-13: 2 g via INTRAVENOUS
  Filled 2020-03-13: qty 100

## 2020-03-13 MED ORDER — MAGNESIUM CITRATE PO SOLN: 1.0000 | Freq: Once | ORAL | Status: DC | PRN

## 2020-03-13 MED ORDER — VANCOMYCIN HCL 1000 MG IV SOLR
INTRAVENOUS | Status: AC
  Filled 2020-03-13: qty 1000

## 2020-03-13 MED ORDER — VANCOMYCIN HCL 1000 MG IV SOLR
INTRAVENOUS | Status: DC | PRN
  Administered 2020-03-13: 1000 mg

## 2020-03-13 MED ORDER — GABAPENTIN 300 MG PO CAPS
300.0000 mg | ORAL_CAPSULE | Freq: Three times a day (TID) | ORAL | Status: DC
  Administered 2020-03-13 – 2020-03-14 (×3): 300 mg via ORAL
  Filled 2020-03-13 (×4): qty 1

## 2020-03-13 MED ORDER — SORBITOL 70 % SOLN: 30.0000 mL | Freq: Every day | Status: DC | PRN

## 2020-03-13 MED ORDER — CHLORHEXIDINE GLUCONATE 0.12 % MT SOLN
15.0000 mL | Freq: Once | OROMUCOSAL | Status: AC
  Administered 2020-03-13: 15 mL via OROMUCOSAL
  Filled 2020-03-13: qty 15

## 2020-03-13 MED ORDER — IRRISEPT - 450ML BOTTLE WITH 0.05% CHG IN STERILE WATER, USP 99.95% OPTIME
TOPICAL | Status: DC | PRN
  Administered 2020-03-13: 450 mL

## 2020-03-13 MED ORDER — BUPIVACAINE LIPOSOME 1.3 % IJ SUSP
INTRAMUSCULAR | Status: DC | PRN
  Administered 2020-03-13: 20 mL

## 2020-03-13 MED ORDER — SIMVASTATIN 20 MG PO TABS
40.0000 mg | ORAL_TABLET | Freq: Every day | ORAL | Status: DC
  Administered 2020-03-14: 40 mg via ORAL
  Filled 2020-03-13: qty 2

## 2020-03-13 MED ORDER — OXYCODONE HCL 5 MG PO TABS
10.0000 mg | ORAL_TABLET | ORAL | Status: DC | PRN
  Administered 2020-03-13: 15 mg via ORAL
  Filled 2020-03-13: qty 3

## 2020-03-13 MED ORDER — FENTANYL CITRATE (PF) 100 MCG/2ML IJ SOLN: 25.0000 ug | INTRAMUSCULAR | Status: DC | PRN

## 2020-03-13 MED ORDER — ONDANSETRON HCL 4 MG/2ML IJ SOLN
INTRAMUSCULAR | Status: DC | PRN
  Administered 2020-03-13: 4 mg via INTRAVENOUS

## 2020-03-13 MED ORDER — ASPIRIN EC 81 MG PO TBEC
81.0000 mg | DELAYED_RELEASE_TABLET | Freq: Two times a day (BID) | ORAL | 0 refills | Status: DC
Start: 2020-03-13 — End: 2024-01-31

## 2020-03-13 MED ORDER — 0.9 % SODIUM CHLORIDE (POUR BTL) OPTIME
TOPICAL | Status: DC | PRN
  Administered 2020-03-13: 1000 mL

## 2020-03-13 MED ORDER — ASPIRIN 81 MG PO CHEW
81.0000 mg | CHEWABLE_TABLET | Freq: Two times a day (BID) | ORAL | Status: DC
  Administered 2020-03-13 – 2020-03-14 (×2): 81 mg via ORAL
  Filled 2020-03-13 (×2): qty 1

## 2020-03-13 MED ORDER — PHENYLEPHRINE HCL-NACL 10-0.9 MG/250ML-% IV SOLN
INTRAVENOUS | Status: DC | PRN
Start: 2020-03-13 — End: 2020-03-13
  Administered 2020-03-13: 30 ug/min via INTRAVENOUS

## 2020-03-13 MED ORDER — LACTATED RINGERS IV SOLN: INTRAVENOUS | Status: DC

## 2020-03-13 MED ORDER — POVIDONE-IODINE 10 % EX SWAB
2.0000 "application " | Freq: Once | CUTANEOUS | Status: AC
  Administered 2020-03-13: 2 via TOPICAL

## 2020-03-13 MED ORDER — PROPOFOL 500 MG/50ML IV EMUL
INTRAVENOUS | Status: DC | PRN
  Administered 2020-03-13: 100 ug/kg/min via INTRAVENOUS

## 2020-03-13 MED ORDER — ONDANSETRON HCL 4 MG PO TABS
4.0000 mg | ORAL_TABLET | Freq: Four times a day (QID) | ORAL | Status: DC | PRN
  Administered 2020-03-13: 4 mg via ORAL
  Filled 2020-03-13: qty 1

## 2020-03-13 MED ORDER — OXYCODONE HCL ER 10 MG PO T12A
10.0000 mg | EXTENDED_RELEASE_TABLET | Freq: Two times a day (BID) | ORAL | Status: DC
  Filled 2020-03-13: qty 1

## 2020-03-13 MED ORDER — METHOCARBAMOL 750 MG PO TABS
750.0000 mg | ORAL_TABLET | Freq: Two times a day (BID) | ORAL | 3 refills | Status: DC | PRN
Start: 2020-03-13 — End: 2021-11-28

## 2020-03-13 MED ORDER — OXYCODONE HCL 5 MG PO TABS: 5.0000 mg | ORAL_TABLET | Freq: Once | ORAL | Status: DC | PRN

## 2020-03-13 MED ORDER — ALPRAZOLAM 0.5 MG PO TABS
1.0000 mg | ORAL_TABLET | Freq: Every day | ORAL | Status: DC
  Administered 2020-03-13: 1 mg via ORAL
  Filled 2020-03-13: qty 2

## 2020-03-13 MED ORDER — BUPIVACAINE HCL (PF) 0.25 % IJ SOLN
INTRAMUSCULAR | Status: AC
  Filled 2020-03-13: qty 20

## 2020-03-13 MED ORDER — OXYCODONE HCL 5 MG/5ML PO SOLN: 5.0000 mg | Freq: Once | ORAL | Status: DC | PRN

## 2020-03-13 MED ORDER — ONDANSETRON HCL 4 MG/2ML IJ SOLN: 4.0000 mg | Freq: Four times a day (QID) | INTRAMUSCULAR | Status: DC | PRN

## 2020-03-13 MED ORDER — ACETAMINOPHEN 325 MG PO TABS: 325.0000 mg | ORAL_TABLET | Freq: Four times a day (QID) | ORAL | Status: DC | PRN

## 2020-03-13 MED ORDER — METOCLOPRAMIDE HCL 5 MG/ML IJ SOLN: 5.0000 mg | Freq: Three times a day (TID) | INTRAMUSCULAR | Status: DC | PRN

## 2020-03-13 MED ORDER — SODIUM CHLORIDE 0.9 % IR SOLN
Status: DC | PRN
  Administered 2020-03-13: 3000 mL

## 2020-03-13 MED ORDER — ORAL CARE MOUTH RINSE: 15.0000 mL | Freq: Once | OROMUCOSAL | Status: AC

## 2020-03-13 MED ORDER — SODIUM CHLORIDE 0.9 % IV SOLN: INTRAVENOUS | Status: DC

## 2020-03-13 MED ORDER — OXYCODONE HCL 5 MG PO TABS: 5.0000 mg | ORAL_TABLET | ORAL | Status: DC | PRN

## 2020-03-13 MED ORDER — MIDAZOLAM HCL 2 MG/2ML IJ SOLN
INTRAMUSCULAR | Status: AC
  Administered 2020-03-13: 2 mg via INTRAVENOUS
  Filled 2020-03-13: qty 2

## 2020-03-13 MED ORDER — ROPIVACAINE HCL 7.5 MG/ML IJ SOLN
INTRAMUSCULAR | Status: DC | PRN
Start: 2020-03-13 — End: 2020-03-13
  Administered 2020-03-13: 25 mL via PERINEURAL

## 2020-03-13 MED ORDER — MIDAZOLAM HCL 2 MG/2ML IJ SOLN: 2.0000 mg | Freq: Once | INTRAMUSCULAR | Status: AC

## 2020-03-13 MED ORDER — FENTANYL CITRATE (PF) 250 MCG/5ML IJ SOLN
INTRAMUSCULAR | Status: AC
  Filled 2020-03-13: qty 5

## 2020-03-13 MED ORDER — FENTANYL CITRATE (PF) 100 MCG/2ML IJ SOLN: 100.0000 ug | Freq: Once | INTRAMUSCULAR | Status: AC

## 2020-03-13 MED ORDER — BUPIVACAINE HCL (PF) 0.25 % IJ SOLN
INTRAMUSCULAR | Status: DC | PRN
  Administered 2020-03-13: 20 mL

## 2020-03-13 MED ORDER — DOCUSATE SODIUM 100 MG PO CAPS
100.0000 mg | ORAL_CAPSULE | Freq: Two times a day (BID) | ORAL | Status: DC
  Administered 2020-03-13 – 2020-03-14 (×2): 100 mg via ORAL
  Filled 2020-03-13 (×2): qty 1

## 2020-03-13 MED ORDER — LEVOTHYROXINE SODIUM 50 MCG PO TABS
50.0000 ug | ORAL_TABLET | Freq: Every day | ORAL | Status: DC
  Administered 2020-03-14: 50 ug via ORAL
  Filled 2020-03-13: qty 1

## 2020-03-13 MED ORDER — CEFAZOLIN SODIUM-DEXTROSE 2-4 GM/100ML-% IV SOLN
2.0000 g | Freq: Four times a day (QID) | INTRAVENOUS | Status: AC
  Administered 2020-03-13 – 2020-03-14 (×3): 2 g via INTRAVENOUS
  Filled 2020-03-13 (×3): qty 100

## 2020-03-13 MED ORDER — ONDANSETRON HCL 4 MG/2ML IJ SOLN
INTRAMUSCULAR | Status: AC
  Filled 2020-03-13: qty 2

## 2020-03-13 MED ORDER — ACETAMINOPHEN 500 MG PO TABS
1000.0000 mg | ORAL_TABLET | Freq: Four times a day (QID) | ORAL | Status: AC
  Administered 2020-03-13 – 2020-03-14 (×3): 1000 mg via ORAL
  Filled 2020-03-13 (×4): qty 2

## 2020-03-13 MED ORDER — SULFAMETHOXAZOLE-TRIMETHOPRIM 800-160 MG PO TABS
1.0000 | ORAL_TABLET | Freq: Two times a day (BID) | ORAL | 0 refills | Status: DC
Start: 2020-03-13 — End: 2021-11-28

## 2020-03-13 MED ORDER — POLYETHYLENE GLYCOL 3350 17 G PO PACK: 17.0000 g | PACK | Freq: Every day | ORAL | Status: DC | PRN

## 2020-03-13 MED ORDER — DEXAMETHASONE SODIUM PHOSPHATE 10 MG/ML IJ SOLN
10.0000 mg | Freq: Once | INTRAMUSCULAR | Status: AC
  Administered 2020-03-14: 10 mg via INTRAVENOUS
  Filled 2020-03-13: qty 1

## 2020-03-13 MED ORDER — DEXAMETHASONE SODIUM PHOSPHATE 10 MG/ML IJ SOLN
INTRAMUSCULAR | Status: DC | PRN
Start: 2020-03-13 — End: 2020-03-13
  Administered 2020-03-13: 10 mg

## 2020-03-13 MED ORDER — HYDROMORPHONE HCL 1 MG/ML IJ SOLN: 0.5000 mg | INTRAMUSCULAR | Status: DC | PRN

## 2020-03-13 MED ORDER — CELECOXIB 200 MG PO CAPS
200.0000 mg | ORAL_CAPSULE | Freq: Two times a day (BID) | ORAL | Status: DC
  Administered 2020-03-14: 200 mg via ORAL
  Filled 2020-03-13 (×4): qty 1

## 2020-03-13 MED ORDER — PHENOL 1.4 % MT LIQD: 1.0000 | OROMUCOSAL | Status: DC | PRN

## 2020-03-13 MED ORDER — FENTANYL CITRATE (PF) 100 MCG/2ML IJ SOLN
INTRAMUSCULAR | Status: AC
  Administered 2020-03-13: 100 ug via INTRAVENOUS
  Filled 2020-03-13: qty 2

## 2020-03-13 MED ORDER — ACETAMINOPHEN 325 MG PO TABS: 325.0000 mg | ORAL_TABLET | ORAL | Status: DC | PRN

## 2020-03-13 MED ORDER — ALUM & MAG HYDROXIDE-SIMETH 200-200-20 MG/5ML PO SUSP: 30.0000 mL | ORAL | Status: DC | PRN

## 2020-03-13 MED ORDER — VANCOMYCIN HCL IN DEXTROSE 1-5 GM/200ML-% IV SOLN
1000.0000 mg | INTRAVENOUS | Status: AC
  Administered 2020-03-13: 1000 mg via INTRAVENOUS
  Filled 2020-03-13: qty 200

## 2020-03-13 MED ORDER — METOCLOPRAMIDE HCL 5 MG PO TABS: 5.0000 mg | ORAL_TABLET | Freq: Three times a day (TID) | ORAL | Status: DC | PRN

## 2020-03-13 MED ORDER — SODIUM CHLORIDE 0.9% FLUSH
INTRAVENOUS | Status: DC | PRN
  Administered 2020-03-13: 20 mL

## 2020-03-13 MED ORDER — TRANEXAMIC ACID-NACL 1000-0.7 MG/100ML-% IV SOLN
1000.0000 mg | INTRAVENOUS | Status: AC
  Administered 2020-03-13: 1000 mg via INTRAVENOUS
  Filled 2020-03-13: qty 100

## 2020-03-13 MED ORDER — ONDANSETRON HCL 4 MG/2ML IJ SOLN: 4.0000 mg | Freq: Once | INTRAMUSCULAR | Status: DC | PRN

## 2020-03-13 MED ORDER — ACETAMINOPHEN 160 MG/5ML PO SOLN: 325.0000 mg | ORAL | Status: DC | PRN

## 2020-03-13 MED ORDER — MEPERIDINE HCL 25 MG/ML IJ SOLN: 6.2500 mg | INTRAMUSCULAR | Status: DC | PRN

## 2020-03-13 MED ORDER — DIPHENHYDRAMINE HCL 12.5 MG/5ML PO ELIX: 25.0000 mg | ORAL_SOLUTION | ORAL | Status: DC | PRN

## 2020-03-13 MED ORDER — MENTHOL 3 MG MT LOZG: 1.0000 | LOZENGE | OROMUCOSAL | Status: DC | PRN

## 2020-03-13 SURGICAL SUPPLY — 84 items
ADH SKN CLS APL DERMABOND .7 (GAUZE/BANDAGES/DRESSINGS) ×2
ALCOHOL 70% 16 OZ (MISCELLANEOUS) ×2 IMPLANT
BAG DECANTER FOR FLEXI CONT (MISCELLANEOUS) ×2 IMPLANT
BANDAGE ESMARK 6X9 LF (GAUZE/BANDAGES/DRESSINGS) IMPLANT
BLADE SAG 18X100X1.27 (BLADE) ×2 IMPLANT
BNDG CMPR 9X6 STRL LF SNTH (GAUZE/BANDAGES/DRESSINGS)
BNDG ESMARK 6X9 LF (GAUZE/BANDAGES/DRESSINGS)
BOWL SMART MIX CTS (DISPOSABLE) ×2 IMPLANT
BSPLAT TIB 5D E CMNT KN RT (Knees) ×1 IMPLANT
CEMENT BONE REFOBACIN R1X40 US (Cement) ×2 IMPLANT
CLSR STERI-STRIP ANTIMIC 1/2X4 (GAUZE/BANDAGES/DRESSINGS) ×4 IMPLANT
COMP FEM CMT PERSONA SZ9 RT (Joint) ×2 IMPLANT
COMPONENT FEM CMT PRSONA SZ9RT (Joint) IMPLANT
COVER SURGICAL LIGHT HANDLE (MISCELLANEOUS) ×2 IMPLANT
COVER WAND RF STERILE (DRAPES) IMPLANT
CUFF TOURN SGL QUICK 34 (TOURNIQUET CUFF) ×2
CUFF TOURN SGL QUICK 42 (TOURNIQUET CUFF) IMPLANT
CUFF TRNQT CYL 34X4.125X (TOURNIQUET CUFF) ×1 IMPLANT
DERMABOND ADVANCED (GAUZE/BANDAGES/DRESSINGS) ×2
DERMABOND ADVANCED .7 DNX12 (GAUZE/BANDAGES/DRESSINGS) ×1 IMPLANT
DRAPE EXTREMITY T 121X128X90 (DISPOSABLE) ×2 IMPLANT
DRAPE HALF SHEET 40X57 (DRAPES) ×2 IMPLANT
DRAPE INCISE IOBAN 66X45 STRL (DRAPES) IMPLANT
DRAPE ORTHO SPLIT 77X108 STRL (DRAPES) ×4
DRAPE POUCH INSTRU U-SHP 10X18 (DRAPES) ×2 IMPLANT
DRAPE SURG ORHT 6 SPLT 77X108 (DRAPES) ×2 IMPLANT
DRAPE U-SHAPE 47X51 STRL (DRAPES) ×4 IMPLANT
DRSG AQUACEL AG ADV 3.5X10 (GAUZE/BANDAGES/DRESSINGS) ×2 IMPLANT
DURAPREP 26ML APPLICATOR (WOUND CARE) ×6 IMPLANT
ELECT CAUTERY BLADE 6.4 (BLADE) ×2 IMPLANT
ELECT REM PT RETURN 9FT ADLT (ELECTROSURGICAL) ×2
ELECTRODE REM PT RTRN 9FT ADLT (ELECTROSURGICAL) ×1 IMPLANT
GLOVE BIOGEL PI IND STRL 7.0 (GLOVE) ×1 IMPLANT
GLOVE BIOGEL PI INDICATOR 7.0 (GLOVE) ×1
GLOVE ECLIPSE 7.0 STRL STRAW (GLOVE) ×6 IMPLANT
GLOVE SKINSENSE NS SZ7.5 (GLOVE) ×3
GLOVE SKINSENSE STRL SZ7.5 (GLOVE) ×3 IMPLANT
GLOVE SURG SYN 7.5  E (GLOVE) ×8
GLOVE SURG SYN 7.5 E (GLOVE) ×4 IMPLANT
GLOVE SURG SYN 7.5 PF PI (GLOVE) ×4 IMPLANT
GOWN STRL REIN XL XLG (GOWN DISPOSABLE) ×2 IMPLANT
GOWN STRL REUS W/ TWL LRG LVL3 (GOWN DISPOSABLE) ×1 IMPLANT
GOWN STRL REUS W/TWL LRG LVL3 (GOWN DISPOSABLE) ×2
HANDPIECE INTERPULSE COAX TIP (DISPOSABLE) ×2
HDLS TROCR DRIL PIN KNEE 75 (PIN) ×4
HOOD PEEL AWAY FLYTE STAYCOOL (MISCELLANEOUS) ×4 IMPLANT
INSERT TIB ARTI SZ8-11 RT 11 (Joint) ×1 IMPLANT
JET LAVAGE IRRISEPT WOUND (IRRIGATION / IRRIGATOR) ×2
KIT BASIN OR (CUSTOM PROCEDURE TRAY) ×2 IMPLANT
KIT TURNOVER KIT B (KITS) ×2 IMPLANT
LAVAGE JET IRRISEPT WOUND (IRRIGATION / IRRIGATOR) ×1 IMPLANT
MANIFOLD NEPTUNE II (INSTRUMENTS) ×2 IMPLANT
MARKER SKIN DUAL TIP RULER LAB (MISCELLANEOUS) ×2 IMPLANT
NDL SPNL 18GX3.5 QUINCKE PK (NEEDLE) ×2 IMPLANT
NEEDLE SPNL 18GX3.5 QUINCKE PK (NEEDLE) ×4 IMPLANT
NS IRRIG 1000ML POUR BTL (IV SOLUTION) ×2 IMPLANT
PACK TOTAL JOINT (CUSTOM PROCEDURE TRAY) ×2 IMPLANT
PAD ARMBOARD 7.5X6 YLW CONV (MISCELLANEOUS) ×4 IMPLANT
PIN DRILL HDLS TROCAR 75 4PK (PIN) IMPLANT
SAW OSC TIP CART 19.5X105X1.3 (SAW) ×2 IMPLANT
SCREW FEMALE HEX FIX 25X2.5 (ORTHOPEDIC DISPOSABLE SUPPLIES) ×1 IMPLANT
SET HNDPC FAN SPRY TIP SCT (DISPOSABLE) ×1 IMPLANT
STAPLER VISISTAT 35W (STAPLE) IMPLANT
STEM POLY PAT PLY 35M KNEE (Knees) ×1 IMPLANT
STEM TIBIA 5 DEG SZ E R KNEE (Knees) IMPLANT
SUCTION FRAZIER HANDLE 10FR (MISCELLANEOUS) ×2
SUCTION FRAZIER TIP 8 FR DISP (SUCTIONS) ×2
SUCTION TUBE FRAZIER 10FR DISP (MISCELLANEOUS) ×1 IMPLANT
SUCTION TUBE FRAZIER 8FR DISP (SUCTIONS) IMPLANT
SUT ETHILON 2 0 FS 18 (SUTURE) ×2 IMPLANT
SUT MNCRL AB 4-0 PS2 18 (SUTURE) IMPLANT
SUT VIC AB 0 CT1 27 (SUTURE) ×4
SUT VIC AB 0 CT1 27XBRD ANBCTR (SUTURE) ×2 IMPLANT
SUT VIC AB 1 CTX 27 (SUTURE) ×7 IMPLANT
SUT VIC AB 2-0 CT1 27 (SUTURE) ×8
SUT VIC AB 2-0 CT1 TAPERPNT 27 (SUTURE) ×4 IMPLANT
SYR 50ML LL SCALE MARK (SYRINGE) ×4 IMPLANT
TIBIA STEM 5 DEG SZ E R KNEE (Knees) ×2 IMPLANT
TOWEL GREEN STERILE (TOWEL DISPOSABLE) ×2 IMPLANT
TOWEL GREEN STERILE FF (TOWEL DISPOSABLE) ×2 IMPLANT
TRAY CATH 16FR W/PLASTIC CATH (SET/KITS/TRAYS/PACK) IMPLANT
TUBE CONNECTING 12X1/4 (SUCTIONS) ×1 IMPLANT
UNDERPAD 30X36 HEAVY ABSORB (UNDERPADS AND DIAPERS) ×2 IMPLANT
WRAP KNEE MAXI GEL POST OP (GAUZE/BANDAGES/DRESSINGS) ×2 IMPLANT

## 2020-03-13 NOTE — TOC Initial Note (Addendum)
Transition of Care [facility]) - Initial/Assessment Note    Patient Details  Name: Julie Miles MRN: [id number] Date of Birth: 06/19/1951  Transition of [facility]) CM/SW Contact:    Sharin Mons, RN Phone Number: [phone] 03/13/2020, 2:53 PM  Clinical Narrative:                  Presents with R knee DJD.     -s/p  R TKA , 03/13/2020, pod # 0  PT evaluation pending . NCM to f/u with toc needs .Marland Kitchen...    6/14  NCM noted PT evaluation / recommendations. Spoke with pt regarding POC needs. Pt states she plans to d/c to home with home health services. States HH was setup prior with preop workup. States without DME needs, received equipment needed prior to surgery. Pt's daughter has taken off work for a week and will care for pt once d/c.   Expected Discharge Plan: Home/Self Care (vs HH vs SNF) Barriers to Discharge: Continued Medical Work up   Patient Goals and CMS Choice        Expected Discharge Plan and Services Expected Discharge Plan: Home/Self Care (vs HH vs SNF)                                              Prior Living Arrangements/Services                       Activities of Daily Living      Permission Sought/Granted                  Emotional Assessment              Admission diagnosis:  Status post total right knee replacement [Z96.651] Patient Active Problem List   Diagnosis Date Noted  . Status post total right knee replacement 03/13/2020  . Primary osteoarthritis of right knee 02/10/2020  . S/P right knee arthroscopy 03/03/2019  . Sciatica of left side 11/19/2018  . ANXIETY 07/20/2010  . HEMATOCHEZIA 07/20/2010   PCP:  Sharilyn Sites, MD Pharmacy:   [facility] [place], [address] [place] [address] Phone: [phone] Fax: [phone]  [place] [address], [place] - [place] [address] [place] [address] [place] [address] Phone: [phone] Fax: [phone]     Social  Determinants of Health (SDOH) Interventions    Readmission Risk Interventions No flowsheet data found.

## 2020-03-13 NOTE — Anesthesia Postprocedure Evaluation (Signed)
Anesthesia Post Note  Patient: Julie Miles  Procedure(s) Performed: RIGHT TOTAL KNEE ARTHROPLASTY (Right Knee)     Patient location during evaluation: PACU Anesthesia Type: MAC Level of consciousness: awake and alert Pain management: pain level controlled Vital Signs Assessment: post-procedure vital signs reviewed and stable Respiratory status: spontaneous breathing, nonlabored ventilation, respiratory function stable and patient connected to nasal cannula oxygen Cardiovascular status: stable and blood pressure returned to baseline Postop Assessment: no apparent nausea or vomiting Anesthetic complications: no   No complications documented.  Last Vitals:  Vitals:   03/13/20 1349 03/13/20 1405  BP: 129/61 131/66  Pulse: (!) 57 62  Resp: 17 16  Temp: (!) 36.3 C   SpO2: 99% 96%    Last Pain:  Vitals:   03/13/20 1349  TempSrc:   PainSc: 0-No pain                 Nettie Wyffels

## 2020-03-13 NOTE — Progress Notes (Signed)
Orthopedic Tech Progress Note Patient Details:  Julie Miles 12-21-1950 [id number]  CPM Right Knee CPM Right Knee: On Right Knee Flexion (Degrees): 0 Right Knee Extension (Degrees): 80 Additional Comments: added ice  Post Interventions Patient Tolerated: Well Instructions Provided: Care of device  [name] 03/13/2020, 1:47 PM

## 2020-03-13 NOTE — Evaluation (Signed)
Physical Therapy Evaluation Patient Details Name: Julie Miles MRN: [id number] DOB: 06/19/1951 Today's Date: 03/13/2020   History of Present Illness  Pt is a 69 y/o female s/p R TKA. PMH includes R knee arthroscopy, and anxiety.   Clinical Impression  Pt is s/p surgery above with deficits below. Pt tolerated ambulation well. Required min guard A for mobility using RW. Reviewed knee precautions with pt. Will continue to follow acutely to maximize functional mobility independence and safety.     Follow Up Recommendations Follow surgeon's recommendation for DC plan and follow-up therapies;Supervision for mobility/OOB    Equipment Recommendations  None recommended by PT    Recommendations for Other Services       Precautions / Restrictions Precautions Precautions: Knee Precaution Booklet Issued: No Precaution Comments: Verbally reviewed knee precautions.  Restrictions Weight Bearing Restrictions: Yes RLE Weight Bearing: Weight bearing as tolerated      Mobility  Bed Mobility Overal bed mobility: Needs Assistance Bed Mobility: Supine to Sit     Supine to sit: Supervision     General bed mobility comments: Supervision for safety.   Transfers Overall transfer level: Needs assistance Equipment used: Rolling walker (2 wheeled) Transfers: Sit to/from Stand Sit to Stand: Min guard         General transfer comment: Min guard for steadying to stand. Demonstrated safe hand placement.   Ambulation/Gait Ambulation/Gait assistance: Min guard Gait Distance (Feet): 40 Feet Assistive device: Rolling walker (2 wheeled) Gait Pattern/deviations: Step-to pattern;Decreased step length - right;Decreased step length - left;Decreased weight shift to right;Antalgic Gait velocity: Decreased   General Gait Details: Mildly antalgic gait, however, overall tolerated well. Cues for sequencing using RW.   Stairs            Wheelchair Mobility    Modified Rankin (Stroke Patients  Only)       Balance Overall balance assessment: Needs assistance Sitting-balance support: Feet supported;No upper extremity supported Sitting balance-Leahy Scale: Fair     Standing balance support: Bilateral upper extremity supported;During functional activity Standing balance-Leahy Scale: Poor Standing balance comment: Reliant on BUE support                              Pertinent Vitals/Pain Pain Assessment: 0-10 Pain Score: 2  Pain Location: R knee Pain Descriptors / Indicators: Aching;Operative site guarding Pain Intervention(s): Monitored during session;Limited activity within patient's tolerance;Repositioned    Home Living Family/patient expects to be discharged to:: Private residence Living Arrangements: Children Available Help at Discharge: Family;Available 24 hours/day Type of Home: House Home Access: Level entry     Home Layout: One level Home Equipment: Walker - 2 wheels;Bedside commode;Other (comment) (CPM)      Prior Function Level of Independence: Independent               Hand Dominance        Extremity/Trunk Assessment   Upper Extremity Assessment Upper Extremity Assessment: Overall WFL for tasks assessed    Lower Extremity Assessment Lower Extremity Assessment: RLE deficits/detail RLE Deficits / Details: Deficits consistent with post op pain and weakness.     Cervical / Trunk Assessment Cervical / Trunk Assessment: Normal  Communication   Communication: No difficulties  Cognition Arousal/Alertness: Awake/alert Behavior During Therapy: WFL for tasks assessed/performed Overall Cognitive Status: Within Functional Limits for tasks assessed  General Comments      Exercises Total Joint Exercises Ankle Circles/Pumps: AROM;Both;20 reps;Supine   Assessment/Plan    PT Assessment Patient needs continued PT services  PT Problem List Decreased strength;Decreased range of  motion;Decreased balance;Decreased mobility;Decreased knowledge of use of DME;Decreased knowledge of precautions;Pain       PT Treatment Interventions DME instruction;Gait training;Functional mobility training;Therapeutic activities;Therapeutic exercise;Balance training;Patient/family education    PT Goals (Current goals can be found in the Care Plan section)  Acute Rehab PT Goals Patient Stated Goal: to go home PT Goal Formulation: With patient Time For Goal Achievement: 03/27/20 Potential to Achieve Goals: Good    Frequency 7X/week   Barriers to discharge        Co-evaluation               AM-PAC PT "6 Clicks" Mobility  Outcome Measure Help needed turning from your back to your side while in a flat bed without using bedrails?: None Help needed moving from lying on your back to sitting on the side of a flat bed without using bedrails?: None Help needed moving to and from a bed to a chair (including a wheelchair)?: A Little Help needed standing up from a chair using your arms (e.g., wheelchair or bedside chair)?: A Little Help needed to walk in hospital room?: A Little Help needed climbing 3-5 steps with a railing? : A Lot 6 Click Score: 19    End of Session Equipment Utilized During Treatment: Gait belt Activity Tolerance: Patient tolerated treatment well Patient left: in chair;with call bell/phone within reach Nurse Communication: Mobility status PT Visit Diagnosis: Muscle weakness (generalized) (M62.81);Pain Pain - Right/Left: Right Pain - part of body: Knee    Time: 0165-5374 PT Time Calculation (min) (ACUTE ONLY): 19 min   Charges:   PT Evaluation $PT Eval Low Complexity: 1 Low          Lou Miner, DPT  Acute Rehabilitation Services  Pager: [phone] Office: [phone]   Rudean Hitt 03/13/2020, 5:22 PM

## 2020-03-13 NOTE — Op Note (Signed)
Total Knee Arthroplasty Procedure Note  Preoperative diagnosis: Right knee osteoarthritis  Postoperative diagnosis:same  Operative procedure: Right total knee arthroplasty. CPT [phone]  Surgeon: N. Eduard Roux, MD  Assist: Benita Stabile, PA-C; necessary for the timely completion of procedure and due to complexity of procedure.  Anesthesia: Spinal, regional, local  Tourniquet time: see anesthesia record  Implants used: Zimmer persona Femur: CR 9 Tibia: E Patella: 35 mm Polyethylene: 11 mm, MC  Indication: Julie Miles is a 69 y.o. year old female with a history of knee pain. Having failed conservative management, the patient elected to proceed with a total knee arthroplasty.  We have reviewed the risk and benefits of the surgery and they elected to proceed after voicing understanding.  Procedure:  After informed consent was obtained and understanding of the risk were voiced including but not limited to bleeding, infection, damage to surrounding structures including nerves and vessels, blood clots, leg length inequality and the failure to achieve desired results, the operative extremity was marked with verbal confirmation of the patient in the holding area.   The patient was then brought to the operating room and transported to the operating room table in the supine position.  A tourniquet was applied to the operative extremity around the upper thigh. The operative limb was then prepped and draped in the usual sterile fashion and preoperative antibiotics were administered.  A time out was performed prior to the start of surgery confirming the correct extremity, preoperative antibiotic administration, as well as team members, implants and instruments available for the case. Correct surgical site was also confirmed with preoperative radiographs. The limb was then elevated for exsanguination and the tourniquet was inflated. A midline incision was made and a standard medial parapatellar  approach was performed.  The infrapatellar fat pad was removed.  Suprapatellar synovium was removed to reveal the anterior distal femoral cortex.  A medial peel was performed to release the capsule of the medial tibial plateau.  The patella was then everted and was prepared and sized to a 35 mm.  A cover was placed on the patella for protection from retractors.  The knee was then brought into full flexion and we then turned our attention to the femur.  The cruciates were sacrificed.  Start site was drilled in the femur and the intramedullary distal femoral cutting guide was placed, set at 5 degrees valgus, taking 12 mm of distal resection. The distal cut was made. Osteophytes were then removed.  Next, the proximal tibial cutting guide was placed with appropriate slope, varus/valgus alignment and depth of resection. The proximal tibial cut was made. Gap blocks were then used to assess the extension gap and alignment, and appropriate soft tissue releases were performed. Attention was turned back to the femur, which was sized using the sizing guide to a size 9. Appropriate rotation of the femoral component was determined using epicondylar axis, Whiteside's line, and assessing the flexion gap under ligament tension. The appropriate size 4-in-1 cutting block was placed and checked with an angel wing and cuts were made. Posterior femoral osteophytes and uncapped bone were then removed with the curved osteotome.  Trial components were placed, and stability was checked in full extension, mid-flexion, and deep flexion. Proper tibial rotation was determined and marked.  The patella tracked well without a lateral release.  The femoral lugs were then drilled. Trial components were then removed and tibial preparation performed.  The tibia was sized for a size E component.  A posterior capsular injection  comprising of 20 cc of 1.3% exparel, 20 cc of 0.25% bupivicaine with epi and 20 cc of normal saline was performed for  postoperative pain control. The bony surfaces were irrigated with a pulse lavage and then dried. Bone cement was vacuum mixed on the back table, and the final components sized above were cemented into place.  Antibiotic irrigation was placed in the knee joint and soft tissues while the cement cured.  After cement had finished curing, excess cement was removed. The stability of the construct was re-evaluated throughout a range of motion and found to be acceptable. The trial liner was removed, the knee was copiously irrigated, and the knee was re-evaluated for any excess bone debris. The real polyethylene liner, 11 mm thick, was inserted and checked to ensure the locking mechanism had engaged appropriately. The tourniquet was deflated and hemostasis was achieved. The wound was irrigated with normal saline.  One gram of vancomycin powder was placed in the surgical bed.  Capsular closure was performed with a #1 vicryl, subcutaneous fat closed with a 0 vicryl suture, then subcutaneous tissue closed with interrupted 2.0 vicryl suture. The skin was then closed with a 2.0 nylon and dermabond. A sterile dressing was applied.  The patient was awakened in the operating room and taken to recovery in stable condition. All sponge, needle, and instrument counts were correct at the end of the case.  Position: supine  Complications: none.  Time Out: performed   Drains/Packing: none  Estimated blood loss: minimal  Returned to Recovery Room: in good condition.   Antibiotics: yes   Mechanical VTE (DVT) Prophylaxis: sequential compression devices, TED thigh-high  Chemical VTE (DVT) Prophylaxis: aspirin  Fluid Replacement  Crystalloid: see anesthesia record Blood: none  FFP: none   Specimens Removed: 1 to pathology   Sponge and Instrument Count Correct? yes   PACU: portable radiograph - knee AP and Lateral   Plan/RTC: Return in 2 weeks for wound check.   Weight Bearing/Load Lower Extremity: full   N.  Eduard Roux, MD [facility] 11:30 AM

## 2020-03-13 NOTE — Anesthesia Procedure Notes (Signed)
Anesthesia Regional Block: Adductor canal block   Pre-Anesthetic Checklist: ,, timeout performed, Correct Patient, Correct Site, Correct Laterality, Correct Procedure, Correct Position, site marked, Risks and benefits discussed,  Surgical consent,  Pre-op evaluation,  At surgeon's request and post-op pain management  Laterality: Right  Prep: chloraprep       Needles:  Injection technique: Single-shot  Needle Type: Echogenic Stimulator Needle     Needle Length: 5cm  Needle Gauge: 22     Additional Needles:   Procedures:, nerve stimulator,,, ultrasound used (permanent image in chart),,,,  Narrative:  Start time: 03/13/2020 9:20 AM End time: 03/13/2020 9:24 AM Injection made incrementally with aspirations every 5 mL.  Performed by: Personally  Anesthesiologist: Janeece Riggers, MD  Additional Notes: Functioning IV was confirmed and monitors were applied.  A 105mm 22ga Arrow echogenic stimulator needle was used. Sterile prep and drape,hand hygiene and sterile gloves were used. Ultrasound guidance: relevant anatomy identified, needle position confirmed, local anesthetic spread visualized around nerve(s)., vascular puncture avoided.  Image printed for medical record. Negative aspiration and negative test dose prior to incremental administration of local anesthetic. The patient tolerated the procedure well.

## 2020-03-13 NOTE — Transfer of Care (Signed)
Immediate Anesthesia Transfer of Care Note  Patient: Julie Miles  Procedure(s) Performed: RIGHT TOTAL KNEE ARTHROPLASTY (Right Knee)  Patient Location: PACU  Anesthesia Type:Spinal  Level of Consciousness: drowsy  Airway & Oxygen Therapy: Patient Spontanous Breathing and Patient connected to face mask oxygen  Post-op Assessment: Report given to RN and Post -op Vital signs reviewed and stable  Post vital signs: Reviewed and stable  Last Vitals:  Vitals Value Taken Time  BP 93/51 03/13/20 1220  Temp    Pulse 67 03/13/20 1222  Resp 9 03/13/20 1222  SpO2 98 % 03/13/20 1222  Vitals shown include unvalidated device data.  Last Pain:  Vitals:   03/13/20 0930  TempSrc:   PainSc: 0-No pain         Complications: No complications documented.

## 2020-03-13 NOTE — Anesthesia Procedure Notes (Signed)
Procedure Name: MAC Date/Time: 03/13/2020 9:57 AM Performed by: Kyung Rudd, CRNA Pre-anesthesia Checklist: Patient identified, Emergency Drugs available, Suction available and Patient being monitored Patient Re-evaluated:Patient Re-evaluated prior to induction Oxygen Delivery Method: Simple face mask Induction Type: IV induction Placement Confirmation: positive ETCO2

## 2020-03-13 NOTE — Plan of Care (Signed)
  Problem: Education: Goal: Knowledge of General Education information will improve Description: Including pain rating scale, medication(s)/side effects and non-pharmacologic comfort measures Outcome: Progressing   Problem: Pain Managment: Goal: General experience of comfort will improve Outcome: Progressing   Problem: Safety: Goal: Ability to remain free from injury will improve Outcome: Progressing   

## 2020-03-13 NOTE — H&P (Signed)
PREOPERATIVE H&P  Chief Complaint: right knee degenerative joint disease  HPI: Julie Miles is a 69 y.o. female who presents for surgical treatment of right knee degenerative joint disease.  She denies any changes in medical history.  Past Medical History:  Diagnosis Date  . Anxiety   . History of blood transfusion 1975   at [facility] after vag delivery  . History of kidney stones   . Hypercholesterolemia   . Hypothyroidism   . MRSA (methicillin resistant Staphylococcus aureus) 03/08/2020   confirmed by PCR  . PONV (postoperative nausea and vomiting)    Past Surgical History:  Procedure Laterality Date  . ANTERIOR AND POSTERIOR REPAIR  06/24/2012   Procedure: ANTERIOR (CYSTOCELE) AND POSTERIOR REPAIR (RECTOCELE);  Surgeon: Daria Pastures, MD;  Location: [facility];  Service: Gynecology;  Laterality: N/A;  anterior repair  . APPENDECTOMY    . BLADDER SUSPENSION  06/24/2012   Procedure: TRANSVAGINAL TAPE (TVT) PROCEDURE;  Surgeon: Daria Pastures, MD;  Location: [facility];  Service: Gynecology;  Laterality: N/A;  . CHOLECYSTECTOMY    . CYSTOSCOPY  06/24/2012   Procedure: CYSTOSCOPY;  Surgeon: Daria Pastures, MD;  Location: [facility];  Service: Gynecology;  Laterality: N/A;  . FOOT SURGERY Right   . KNEE ARTHROSCOPY W/ MENISCAL REPAIR Right   . LAPAROSCOPY  06/24/2012   Procedure: LAPAROSCOPY OPERATIVE;  Surgeon: Daria Pastures, MD;  Location: [facility];  Service: Gynecology;  Laterality: N/A;  . TUBAL LIGATION    . VAGINAL HYSTERECTOMY  06/24/2012   Procedure: HYSTERECTOMY VAGINAL;  Surgeon: Daria Pastures, MD;  Location: [facility];  Service: Gynecology;  Laterality: N/A;   Social History   Socioeconomic History  . Marital status: Divorced    Spouse name: Not on file  . Number of children: Not on file  . Years of education: Not on file  . Highest education level: Not on file  Occupational History  . Not on file  Tobacco Use  . Smoking status: Former  Smoker    Quit date: 2006    Years since quitting: 15.4  . Smokeless tobacco: Never Used  . Tobacco comment: STATES SHE DOES NOT SMOKE.  Vaping Use  . Vaping Use: Never used  Substance and Sexual Activity  . Alcohol use: No  . Drug use: No  . Sexual activity: Not on file  Other Topics Concern  . Not on file  Social History Narrative  . Not on file   Social Determinants of Health   Financial Resource Strain:   . Difficulty of Paying Living Expenses:   Food Insecurity:   . Worried About Charity fundraiser in the Last Year:   . Arboriculturist in the Last Year:   Transportation Needs:   . Film/video editor (Medical):   Marland Kitchen Lack of Transportation (Non-Medical):   Physical Activity:   . Days of Exercise per Week:   . Minutes of Exercise per Session:   Stress:   . Feeling of Stress :   Social Connections:   . Frequency of Communication with Friends and Family:   . Frequency of Social Gatherings with Friends and Family:   . Attends Religious Services:   . Active Member of Clubs or Organizations:   . Attends Archivist Meetings:   Marland Kitchen Marital Status:    Family History  Problem Relation Age of Onset  . Heart disease Mother   . Diabetes Mother    Allergies  Allergen Reactions  . Codeine Nausea And Vomiting   Prior to Admission medications   Medication Sig Start Date End Date Taking? Authorizing Provider  ALPRAZolam Duanne Moron) 1 MG tablet Take 1 mg by mouth at bedtime.    Yes [provider]  levothyroxine (SYNTHROID, LEVOTHROID) 50 MCG tablet Take 50 mcg by mouth daily before breakfast.   Yes [provider]  simvastatin (ZOCOR) 40 MG tablet Take 1 tablet by mouth daily. 08/03/14  Yes [provider]  cefUROXime (CEFTIN) 250 MG tablet Take 1 tablet (250 mg total) by mouth 2 (two) times daily. Patient not taking: Reported on 03/03/2020 06/25/12   Bobbye Charleston, MD  diclofenac sodium (VOLTAREN) 1 % GEL Apply 2 g topically 4 (four) times  daily. Patient not taking: Reported on 03/03/2020 11/03/17   Leandrew Koyanagi, MD  escitalopram (LEXAPRO) 10 MG tablet Take 1/2 tablet qd times one week. Then one tablet in the morning Patient not taking: Reported on 03/03/2020 11/10/14   Glendell Docker, NP  ketorolac (TORADOL) 10 MG tablet Take 1 tablet (10 mg total) by mouth every 6 (six) hours as needed for severe pain. Patient not taking: Reported on 03/03/2020 01/31/19   Long, Wonda Olds, MD  meloxicam (MOBIC) 7.5 MG tablet Take 1 tablet (7.5 mg total) by mouth 2 (two) times daily as needed for pain. Patient not taking: Reported on 03/03/2020 11/03/17   Leandrew Koyanagi, MD  methocarbamol (ROBAXIN) 500 MG tablet Take 1 tablet (500 mg total) by mouth at bedtime as needed for muscle spasms. Patient not taking: Reported on 03/03/2020 11/19/18   Aundra Dubin, PA-C  mupirocin ointment (BACTROBAN) 2 % Place 1 application into the nose 2 (two) times daily. Apply as directed. 03/09/20   Leandrew Koyanagi, MD  ondansetron (ZOFRAN) 4 MG tablet Take 1 tablet (4 mg total) by mouth every 8 (eight) hours as needed for nausea or vomiting. Patient not taking: Reported on 03/03/2020 02/25/19   Aundra Dubin, PA-C  oxyCODONE-acetaminophen (PERCOCET/ROXICET) 5-325 MG tablet Take 1-2 tablets by mouth every 6 (six) hours as needed for severe pain. Patient not taking: Reported on 03/03/2020 02/25/19   Aundra Dubin, PA-C  phenazopyridine (PYRIDIUM) 200 MG tablet Take 1 tablet (200 mg total) by mouth 3 (three) times daily with meals. Patient not taking: Reported on 03/03/2020 06/25/12   Bobbye Charleston, MD  predniSONE (STERAPRED UNI-PAK 21 TAB) 10 MG (21) TBPK tablet Take as directed Patient not taking: Reported on 03/03/2020 11/19/18   Aundra Dubin, PA-C  traMADol (ULTRAM) 50 MG tablet Take 1-2 tablets (50-100 mg total) by mouth daily as needed. 03/09/20   Leandrew Koyanagi, MD     Positive ROS: All other systems have been reviewed and were otherwise negative with the exception of those  mentioned in the HPI and as above.  Physical Exam: General: Alert, no acute distress Cardiovascular: No pedal edema Respiratory: No cyanosis, no use of accessory musculature GI: abdomen soft Skin: No lesions in the area of chief complaint Neurologic: Sensation intact distally Psychiatric: Patient is competent for consent with normal mood and affect Lymphatic: no lymphedema  MUSCULOSKELETAL: exam stable  Assessment: right knee degenerative joint disease  Plan: Plan for Procedure(s): RIGHT TOTAL KNEE ARTHROPLASTY  The risks benefits and alternatives were discussed with the patient including but not limited to the risks of nonoperative treatment, versus surgical intervention including infection, bleeding, nerve injury,  blood clots, cardiopulmonary complications, morbidity, mortality, among others, and they were willing to  proceed.   Preoperative templating of the joint replacement has been completed, documented, and submitted to the Operating Room personnel in order to optimize intra-operative equipment management.   Eduard Roux, MD 03/13/2020 7:01 AM

## 2020-03-13 NOTE — Anesthesia Preprocedure Evaluation (Signed)
Anesthesia Evaluation  Patient identified by MRN, date of birth, ID band Patient awake    Reviewed: Allergy & Precautions, H&P , NPO status , Patient's Chart, lab work & pertinent test results, reviewed documented beta blocker date and time   History of Anesthesia Complications (+) PONV and history of anesthetic complications  Airway Mallampati: II  TM Distance: >3 FB Neck ROM: full    Dental  (+) Partial Upper   Pulmonary neg pulmonary ROS, former smoker,    Pulmonary exam normal breath sounds clear to auscultation       Cardiovascular Exercise Tolerance: Good negative cardio ROS   Rhythm:regular Rate:Normal     Neuro/Psych PSYCHIATRIC DISORDERS (anxiety) negative neurological ROS     GI/Hepatic negative GI ROS, Neg liver ROS,   Endo/Other  Hypothyroidism   Renal/GU Renal disease     Musculoskeletal  (+) Arthritis , Osteoarthritis,    Abdominal   Peds  Hematology H/o blood transfusion in 1975   Anesthesia Other Findings   Reproductive/Obstetrics negative OB ROS                             Anesthesia Physical  Anesthesia Plan  ASA: II  Anesthesia Plan: Spinal and MAC   Post-op Pain Management:  Regional for Post-op pain   Induction:   PONV Risk Score and Plan:   Airway Management Planned: Nasal Cannula and Simple Face Mask  Additional Equipment:   Intra-op Plan:   Post-operative Plan:   Informed Consent: I have reviewed the patients History and Physical, chart, labs and discussed the procedure including the risks, benefits and alternatives for the proposed anesthesia with the patient or authorized representative who has indicated his/her understanding and acceptance.     Dental Advisory Given  Plan Discussed with: CRNA, Surgeon and Anesthesiologist  Anesthesia Plan Comments:         Anesthesia Quick Evaluation

## 2020-03-14 ENCOUNTER — Encounter (HOSPITAL_COMMUNITY): Payer: Self-pay | Admitting: Orthopaedic Surgery

## 2020-03-14 ENCOUNTER — Telehealth: Payer: Self-pay | Admitting: Orthopaedic Surgery

## 2020-03-14 ENCOUNTER — Other Ambulatory Visit: Payer: Self-pay | Admitting: *Deleted

## 2020-03-14 DIAGNOSIS — Z96651 Presence of right artificial knee joint: Secondary | ICD-10-CM

## 2020-03-14 DIAGNOSIS — M1711 Unilateral primary osteoarthritis, right knee: Secondary | ICD-10-CM

## 2020-03-14 LAB — CBC
HCT: 37.4 % (ref 36.0–46.0)
Hemoglobin: 12.1 g/dL (ref 12.0–15.0)
MCH: 30.3 pg (ref 26.0–34.0)
MCHC: 32.4 g/dL (ref 30.0–36.0)
MCV: 93.5 fL (ref 80.0–100.0)
Platelets: 258 10*3/uL (ref 150–400)
RBC: 4 MIL/uL (ref 3.87–5.11)
RDW: 11.9 % (ref 11.5–15.5)
WBC: 10.5 10*3/uL (ref 4.0–10.5)
nRBC: 0 % (ref 0.0–0.2)

## 2020-03-14 LAB — BASIC METABOLIC PANEL
Anion gap: 9 (ref 5–15)
BUN: 13 mg/dL (ref 8–23)
CO2: 24 mmol/L (ref 22–32)
Calcium: 8.2 mg/dL — ABNORMAL LOW (ref 8.9–10.3)
Chloride: 106 mmol/L (ref 98–111)
Creatinine, Ser: 1.04 mg/dL — ABNORMAL HIGH (ref 0.44–1.00)
GFR calc Af Amer: >60 mL/min (ref >60)
GFR calc non Af Amer: 56 mL/min — ABNORMAL LOW (ref >60)
Glucose, Bld: 196 mg/dL — ABNORMAL HIGH (ref 70–99)
Potassium: 4.4 mmol/L (ref 3.5–5.1)
Sodium: 139 mmol/L (ref 135–145)

## 2020-03-14 NOTE — Discharge Summary (Signed)
Patient ID: Julie Miles MRN: [id number] DOB/AGE: [birth year] 68 y.o.  Admit date: 03/13/2020 Discharge date: 03/14/2020  Admission Diagnoses:  Primary osteoarthritis of right knee  Discharge Diagnoses:  Principal Problem:   Primary osteoarthritis of right knee Active Problems:   Status post total right knee replacement   Past Medical History:  Diagnosis Date  . Anxiety   . History of blood transfusion 1975   at [facility] after vag delivery  . History of kidney stones   . Hypercholesterolemia   . Hypothyroidism   . MRSA (methicillin resistant Staphylococcus aureus) 03/08/2020   confirmed by PCR  . PONV (postoperative nausea and vomiting)     Surgeries: Procedure(s): RIGHT TOTAL KNEE ARTHROPLASTY on 03/13/2020   Consultants (if any):   Discharged Condition: Improved  Hospital Course: Julie Miles is an 69 y.o. female who was admitted 03/13/2020 with a diagnosis of Primary osteoarthritis of right knee and went to the operating room on 03/13/2020 and underwent the above named procedures.    She was given perioperative antibiotics:  Anti-infectives (From admission, onward)   Start     Dose/Rate Route Frequency Ordered Stop   03/13/20 1600  ceFAZolin (ANCEF) IVPB 2g/100 mL premix        2 g 200 mL/hr over 30 Minutes Intravenous Every 6 hours 03/13/20 1429 03/14/20 0302   03/13/20 1107  vancomycin (VANCOCIN) powder  Status:  Discontinued          As needed 03/13/20 1108 03/13/20 1216   03/13/20 0800  ceFAZolin (ANCEF) IVPB 2g/100 mL premix        2 g 200 mL/hr over 30 Minutes Intravenous On call to O.R. 03/13/20 0751 03/13/20 1025   03/13/20 0751  vancomycin (VANCOCIN) IVPB 1000 mg/200 mL premix        1,000 mg 200 mL/hr over 60 Minutes Intravenous 60 min pre-op 03/13/20 0751 03/13/20 1020   03/13/20 0000  sulfamethoxazole-trimethoprim (BACTRIM DS) 800-160 MG tablet     Discontinue     1 tablet Oral 2 times daily 03/13/20 0918      .  She was  given sequential compression devices, early ambulation, and appropriate chemoprophylaxis for DVT prophylaxis.  She benefited maximally from the hospital stay and there were no complications.    Recent vital signs:  Vitals:   03/13/20 2340 03/14/20 0410  BP: 128/68 110/71  Pulse: 64 65  Resp: 16 16  Temp: 98.3 F (36.8 C) 98.1 F (36.7 C)  SpO2: 97% 96%    Recent laboratory studies:  Lab Results  Component Value Date   HGB 12.1 03/14/2020   HGB 14.4 03/08/2020   HGB 12.9 01/31/2019   Lab Results  Component Value Date   WBC 10.5 03/14/2020   PLT 258 03/14/2020   Lab Results  Component Value Date   INR 1.0 03/08/2020   Lab Results  Component Value Date   NA 139 03/14/2020   K 4.4 03/14/2020   CL 106 03/14/2020   CO2 24 03/14/2020   BUN 13 03/14/2020   CREATININE 1.04 (H) 03/14/2020   GLUCOSE 196 (H) 03/14/2020    Discharge Medications:   Allergies as of 03/14/2020      Reactions   Codeine Nausea And Vomiting      Medication List    STOP taking these medications   ondansetron 4 MG tablet Commonly known as: ZOFRAN     TAKE these medications   ALPRAZolam 1 MG tablet Commonly known as: XANAX Take  1 mg by mouth at bedtime.   aspirin EC 81 MG tablet Take 1 tablet (81 mg total) by mouth 2 (two) times daily.   levothyroxine 50 MCG tablet Commonly known as: SYNTHROID Take 50 mcg by mouth daily before breakfast.   methocarbamol 750 MG tablet Commonly known as: ROBAXIN Take 1 tablet (750 mg total) by mouth 2 (two) times daily as needed for muscle spasms. What changed:   medication strength  how much to take  when to take this   mupirocin ointment 2 % Commonly known as: BACTROBAN Place 1 application into the nose 2 (two) times daily. Apply as directed.   simvastatin 40 MG tablet Commonly known as: ZOCOR Take 1 tablet by mouth daily.   sulfamethoxazole-trimethoprim 800-160 MG tablet Commonly known as: BACTRIM DS Take 1 tablet by mouth 2 (two)  times daily.   traMADol 50 MG tablet Commonly known as: ULTRAM Take 1-2 tablets (50-100 mg total) by mouth daily as needed.            Durable Medical Equipment  (From admission, onward)         Start     Ordered   03/13/20 1429  DME Walker rolling  Once       Question:  Patient needs a walker to treat with the following condition  Answer:  Total knee replacement status   03/13/20 1429   03/13/20 1429  DME 3 n 1  Once        03/13/20 1429   03/13/20 1429  DME Bedside commode  Once       Question:  Patient needs a bedside commode to treat with the following condition  Answer:  Total knee replacement status   03/13/20 1429          Diagnostic Studies: DG Chest 2 View  Result Date: 03/09/2020 CLINICAL DATA:  Preoperative EXAM: CHEST - 2 VIEW COMPARISON:  11/10/2014 FINDINGS: The heart size and mediastinal contours are within normal limits. Both lungs are clear. The visualized skeletal structures are unremarkable. IMPRESSION: No acute abnormality of the lungs. Electronically Signed   By: Eddie Candle M.D.   On: 03/09/2020 08:35   DG Knee Right Port  Result Date: 03/13/2020 CLINICAL DATA:  Status post right total knee replacement. EXAM: PORTABLE RIGHT KNEE - 1-2 VIEW COMPARISON:  02/11/2020 FINDINGS: Sequelae of interval total knee arthroplasty are identified. The prosthetic components are normally located. No acute fracture is identified. Postoperative gas is noted in the knee joint and surrounding soft tissues. IMPRESSION: Interval right total knee arthroplasty without evidence of acute complication. Electronically Signed   By: Logan Bores M.D.   On: 03/13/2020 13:32    Disposition: Discharge disposition: 01-Home or Self Care       Discharge Instructions    Call MD / Call 911   Complete by: As directed    If you experience chest pain or shortness of breath, CALL 911 and be transported to the hospital emergency room.  If you develope a fever above 101.5 F, pus (white  drainage) or increased drainage or redness at the wound, or calf pain, call your surgeon's office.   Constipation Prevention   Complete by: As directed    Drink plenty of fluids.  Prune juice may be helpful.  You may use a stool softener, such as Colace (over the counter) 100 mg twice a day.  Use MiraLax (over the counter) for constipation as needed.   Driving restrictions   Complete by: As directed  No driving while taking narcotic pain meds.   Increase activity slowly as tolerated   Complete by: As directed        Follow-up Information    Leandrew Koyanagi, MD In 2 weeks.   Specialty: Orthopedic Surgery Why: For suture removal, For wound re-check Contact information: [place] [id number] [phone]                Signed: Eduard Roux 03/14/2020, 8:01 AM

## 2020-03-14 NOTE — Progress Notes (Signed)
Physical Therapy Treatment Patient Details Name: Julie Miles MRN: [id number] DOB: 1951-03-28 Today's Date: 03/14/2020    History of Present Illness Pt is a 69 y/o female s/p R TKA. PMH includes R knee arthroscopy, and anxiety.     PT Comments    Pt reclined in recliner on arrival.  She is progressing well this session.  Pt advance gt and tolerated HEP well.  HEP issued and patient educated on frequency.  Pt also educated on resting in supported extension and never placing support under her knee.  Pt seated edge of bed dressed and ready to d/c home from a mobility standpoint.     Follow Up Recommendations  Follow surgeons recommendation for DC plan and follow-up therapies;Supervision for mobility/OOB     Equipment Recommendations  None recommended by PT    Recommendations for Other Services       Precautions / Restrictions Precautions Precautions: Knee Precaution Booklet Issued: No Precaution Comments: Verbally reviewed knee precautions.  Restrictions Weight Bearing Restrictions: Yes RLE Weight Bearing: Weight bearing as tolerated    Mobility  Bed Mobility               General bed mobility comments: Pt seated in recliner.  Transfers Overall transfer level: Needs assistance Equipment used: Rolling walker (2 wheeled) Transfers: Sit to/from Stand Sit to Stand: Supervision         General transfer comment: Cues for hand placement to and from seated surface.  Ambulation/Gait Ambulation/Gait assistance: Supervision Gait Distance (Feet): 150 Feet Assistive device: Rolling walker (2 wheeled) Gait Pattern/deviations: Decreased step length - right;Decreased step length - left;Decreased weight shift to right;Antalgic;Step-through pattern     General Gait Details: Mildly antalgic gait, however, overall tolerated well. Cues for safety with RW.   Pt able to progress to step through pattern.   Stairs             Wheelchair Mobility    Modified Rankin  (Stroke Patients Only)       Balance Overall balance assessment: Needs assistance Sitting-balance support: Feet supported;No upper extremity supported Sitting balance-Leahy Scale: Fair     Standing balance support: Bilateral upper extremity supported;During functional activity Standing balance-Leahy Scale: Poor                              Cognition Arousal/Alertness: Awake/alert Behavior During Therapy: WFL for tasks assessed/performed Overall Cognitive Status: Within Functional Limits for tasks assessed                                        Exercises Total Joint Exercises Ankle Circles/Pumps: AROM;Both;20 reps;Supine Quad Sets: AROM;Right;10 reps;Supine Heel Slides: AAROM;Right;10 reps;Supine Hip ABduction/ADduction: AROM;Right;10 reps;Supine Straight Leg Raises: AAROM;Right;10 reps;Supine;Other (comment);Limitations Straight Leg Raises Limitations: noted extensor lag Goniometric ROM: 3-95 degrees R knee.    General Comments        Pertinent Vitals/Pain Pain Assessment: 0-10 Pain Score: 2  Pain Location: R knee Pain Descriptors / Indicators: Aching;Operative site guarding Pain Intervention(s): Monitored during session;Repositioned    Home Living                      Prior Function            PT Goals (current goals can now be found in the care plan section) Acute Rehab PT Goals Patient Stated Goal:  to go home Potential to Achieve Goals: Good Progress towards PT goals: Progressing toward goals    Frequency    7X/week      PT Plan Current plan remains appropriate    Co-evaluation              AM-PAC PT "6 Clicks" Mobility   Outcome Measure  Help needed turning from your back to your side while in a flat bed without using bedrails?: None Help needed moving from lying on your back to sitting on the side of a flat bed without using bedrails?: None Help needed moving to and from a bed to a chair (including  a wheelchair)?: A Little Help needed standing up from a chair using your arms (e.g., wheelchair or bedside chair)?: A Little Help needed to walk in hospital room?: A Little Help needed climbing 3-5 steps with a railing? : A Little 6 Click Score: 20    End of Session   Activity Tolerance: Patient tolerated treatment well Patient left: in bed;with call bell/phone within reach;with bed alarm set (pt seated edge of bed.) Nurse Communication: Mobility status PT Visit Diagnosis: Muscle weakness (generalized) (M62.81);Pain Pain - Right/Left: Right Pain - part of body: Knee     Time: 0951-1009 PT Time Calculation (min) (ACUTE ONLY): 18 min  Charges:  $Gait Training: 8-22 mins                     Erasmo Leventhal , PTA Acute Rehabilitation Services Pager [phone] Office [phone]     Julie Miles Julie Miles 03/14/2020, 10:15 AM

## 2020-03-14 NOTE — Progress Notes (Signed)
Provided discharge education/instructions, all questions and concerns addressed, Pt not in distress, discharged home with belongings accompanied by friend.

## 2020-03-14 NOTE — TOC Transition Note (Signed)
Transition of Care [facility]) - CM/SW Discharge Note   Patient Details  Name: Julie Miles MRN: [id number] Date of Birth: [birth year]  Transition of Care [facility]) CM/SW Contact:  Sharin Mons, RN Phone Number: [phone] 03/14/2020, 12:23 PM   Clinical Narrative:    Pt will transition to home today. NCM unable to land [facility] agency. [facility], [facility], [place] [facility], [facility], [facility], [facility], [facility] , [place] all unable to provide services. MD made aware. MD requested NCM call [facility]/Sherrie Marcello [place] @ [phone] and have her arrange outpatient PT. NCM called and left voice message. NCM made pt aware.  Pt without DME needs or Rx med concerns.  Daughter to provide transportation to home.  Final next level of care: Home w Home Health Services Barriers to Discharge: No Barriers Identified   Patient Goals and CMS Choice     Choice offered to / list presented to : Patient  Discharge Placement                       Discharge Plan and Services                DME Arranged: N/A DME Agency: NA       HH Arranged: PT   Date HH Agency Contacted: 03/14/20 Time HH Agency Contacted: [phone]    Social Determinants of Health (SDOH) Interventions     Readmission Risk Interventions No flowsheet data found.

## 2020-03-14 NOTE — Plan of Care (Signed)
  Problem: Health Behavior/Discharge Planning: Goal: Ability to manage health-related needs will improve Outcome: Progressing   Problem: Clinical Measurements: Goal: Ability to maintain clinical measurements within normal limits will improve Outcome: Progressing   Problem: Activity: Goal: Risk for activity intolerance will decrease Outcome: Progressing   Problem: Nutrition: Goal: Adequate nutrition will be maintained Outcome: Progressing   Problem: Coping: Goal: Level of anxiety will decrease Outcome: Progressing   Problem: Elimination: Goal: Will not experience complications related to bowel motility Outcome: Progressing   Problem: Safety: Goal: Ability to remain free from injury will improve Outcome: Progressing   Problem: Skin Integrity: Goal: Risk for impaired skin integrity will decrease Outcome: Progressing   

## 2020-03-14 NOTE — Progress Notes (Signed)
   Subjective:  Patient reports pain as mild.  Objective:   VITALS:   Vitals:   03/13/20 1435 03/13/20 1958 03/13/20 2340 03/14/20 0410  BP: (!) 126/56 130/64 128/68 110/71  Pulse: (!) 54 (!) 58 64 65  Resp: 17 16 16 16   Temp: 98.3 F (36.8 C) 98.6 F (37 C) 98.3 F (36.8 C) 98.1 F (36.7 C)  TempSrc:  Oral Oral Oral  SpO2: 96% 98% 97% 96%  Weight:      Height:        Neurovascular intact Sensation intact distally Intact pulses distally Dorsiflexion/Plantar flexion intact Incision: dressing C/D/I and no drainage No cellulitis present Compartment soft   Lab Results  Component Value Date   WBC 10.5 03/14/2020   HGB 12.1 03/14/2020   HCT 37.4 03/14/2020   MCV 93.5 03/14/2020   PLT 258 03/14/2020     Assessment/Plan:  1 Day Post-Op   - Expected postop acute blood loss anemia - will monitor for symptoms - Up with PT/OT - DVT ppx - SCDs, ambulation, aspirin - WBAT operative extremity - Pain control - Discharge planning- home today after PT  Eduard Roux 03/14/2020, 8:01 AM [phone]

## 2020-03-14 NOTE — Telephone Encounter (Signed)
FYI

## 2020-03-14 NOTE — Telephone Encounter (Signed)
[facility]   Patient is being released soon and they need to know if her home health PT has been or is going to be set up.  Call back: [phone]

## 2020-03-14 NOTE — Telephone Encounter (Signed)
Levada Dy called back. States she can put an order HHPT in [place]. Dr. Erlinda Hong will need to [facility] and do a face to face.   Dr. Erlinda Hong is aware and will do this.

## 2020-03-14 NOTE — Telephone Encounter (Signed)
I called number back. Spoke to [facility] Case Freight forwarder. States patient is not set up with HHPT. I did advise her [facility]-was out of network so [place] spoke to a Tourist information centre manager (not sure which one) at the hospital for them to set up [place] with another agency. This was not done. Levada Dy states she was not aware. I did advise to Levada Dy I would be more than happy to fax HHPT order somewhere else. She will call back in a few with HHPT (in network)  Fax Number.

## 2020-03-14 NOTE — Care Plan (Signed)
[facility] RNCM received call from hospital care management requesting assistance with setting patient up for OPPT as she has attempted numerous home health physical therapy referrals and all have declined to accept referral. RNCM spoke with patient, who states she has received therapy at [facility] PT in [place] at the [place] office location previously and this is close to her home. She also requested some assistance with financial help due to co-pay she states is $35/visit of OPPT. CM spoke with [facility] and discussed this. Also reached out to [place] at [facility] and noted they could accommodate and give patient information related to how to obtain financial assistance. Referral made to [place] with [facility] after discussing with patient. Order/referral completed in Epic. Facility to call patient to schedule her initial eval for OPPT.MD aware.

## 2020-03-15 ENCOUNTER — Other Ambulatory Visit: Payer: Self-pay

## 2020-03-15 ENCOUNTER — Encounter (HOSPITAL_COMMUNITY): Payer: Self-pay | Admitting: Physical Therapy

## 2020-03-15 ENCOUNTER — Ambulatory Visit (HOSPITAL_COMMUNITY): Payer: Medicare HMO | Attending: Orthopaedic Surgery | Admitting: Physical Therapy

## 2020-03-15 DIAGNOSIS — M6281 Muscle weakness (generalized): Secondary | ICD-10-CM | POA: Diagnosis present

## 2020-03-15 DIAGNOSIS — M25661 Stiffness of right knee, not elsewhere classified: Secondary | ICD-10-CM | POA: Diagnosis present

## 2020-03-15 DIAGNOSIS — R2689 Other abnormalities of gait and mobility: Secondary | ICD-10-CM | POA: Insufficient documentation

## 2020-03-15 DIAGNOSIS — M25561 Pain in right knee: Secondary | ICD-10-CM | POA: Insufficient documentation

## 2020-03-15 NOTE — Therapy (Signed)
[place], [place], [address] Phone: [phone]   Fax:  [phone]  Physical Therapy Evaluation  Patient Details  Name: Julie Miles MRN: [id number] Date of Birth: 1950/12/27 Referring Provider (PT): Frankey Shown MD   Encounter Date: 03/15/2020   PT End of Session - 03/15/20 1339    Visit Number 1    Number of Visits 18    Date for PT Re-Evaluation 04/26/20    Authorization Type [employer] medicare (no visit limit, [id number])    Progress Note Due on Visit 10    PT Start Time 1250    PT Stop Time 1335    PT Time Calculation (min) 45 min    Activity Tolerance Patient tolerated treatment well    Behavior During Therapy [facility] for tasks assessed/performed           Past Medical History:  Diagnosis Date  . Anxiety   . History of blood transfusion 1975   at [facility] after vag delivery  . History of kidney stones   . Hypercholesterolemia   . Hypothyroidism   . MRSA (methicillin resistant Staphylococcus aureus) 03/08/2020   confirmed by PCR  . PONV (postoperative nausea and vomiting)     Past Surgical History:  Procedure Laterality Date  . ANTERIOR AND POSTERIOR REPAIR  06/24/2012   Procedure: ANTERIOR (CYSTOCELE) AND POSTERIOR REPAIR (RECTOCELE);  Surgeon: Daria Pastures, MD;  Location: [facility];  Service: Gynecology;  Laterality: N/A;  anterior repair  . APPENDECTOMY    . BLADDER SUSPENSION  06/24/2012   Procedure: TRANSVAGINAL TAPE (TVT) PROCEDURE;  Surgeon: Daria Pastures, MD;  Location: [facility];  Service: Gynecology;  Laterality: N/A;  . CHOLECYSTECTOMY    . CYSTOSCOPY  06/24/2012   Procedure: CYSTOSCOPY;  Surgeon: Daria Pastures, MD;  Location: [facility];  Service: Gynecology;  Laterality: N/A;  . FOOT SURGERY Right   . KNEE ARTHROSCOPY W/ MENISCAL REPAIR Right   . LAPAROSCOPY  06/24/2012   Procedure: LAPAROSCOPY OPERATIVE;  Surgeon: Daria Pastures, MD;  Location: [facility];  Service:  Gynecology;  Laterality: N/A;  . TOTAL KNEE ARTHROPLASTY Right 03/13/2020   Procedure: RIGHT TOTAL KNEE ARTHROPLASTY;  Surgeon: Leandrew Koyanagi, MD;  Location: [facility];  Service: Orthopedics;  Laterality: Right;  . TUBAL LIGATION    . VAGINAL HYSTERECTOMY  06/24/2012   Procedure: HYSTERECTOMY VAGINAL;  Surgeon: Daria Pastures, MD;  Location: [facility];  Service: Gynecology;  Laterality: N/A;    There were no vitals filed for this visit.    Subjective Assessment - 03/15/20 1249    Subjective Patient is a 69 y.o. female who presents to physical therapy s/p R TKA on 03/13/20. Patient states she was doing good with transfers and walking until using the CPM machine last night for 3 hours and today for 3 hours. She has been getting around well with RW. She has CPM machine set to 60 degrees. Her main goal is to improve her function. She had chronic knee pain until this and she had her knee giving out on her.    Limitations Standing;Walking;House hold activities    How long can you walk comfortably? 5 minutes    Patient Stated Goals to improve function and get back to normal    Currently in Pain? Yes    Pain Score 4     Pain Location Knee    Pain Orientation Right    Pain Descriptors / Indicators Aching  Pain Type Surgical pain              [facility] PT Assessment - 03/15/20 0001      Assessment   Medical Diagnosis R TKA    Referring Provider (PT) Frankey Shown MD    Onset Date/Surgical Date 03/13/20    Next MD Visit 03/28/20    Prior Therapy Yes for meniscus      Precautions   Precautions Knee      Restrictions   Weight Bearing Restrictions No      Balance Screen   Has the patient fallen in the past 6 months No    Has the patient had a decrease in activity level because of a fear of falling?  No    Is the patient reluctant to leave their home because of a fear of falling?  No      Prior Function   Level of Independence Independent    Vocation Retired    [employer] fast [employer]     Leisure Walking      Cognition   Overall Cognitive Status Within Functional Limits for tasks assessed      Observation/Other Assessments   Observations Ambulates with RW, decreased R knee flexion/extension ROM    Focus on Therapeutic Outcomes (FOTO)  61% limited      Sensation   Light Touch Appears Intact      ROM / Strength   AROM / PROM / Strength AROM;Strength      AROM   AROM Assessment Site Knee    Right/Left Knee Right;Left    Right Knee Extension 14   lacking    Right Knee Flexion 103    Left Knee Extension 0    Left Knee Flexion 136      Strength   Strength Assessment Site Hip;Knee;Ankle    Right/Left Hip Right;Left    Right Hip Flexion 3/5    Left Hip Flexion 4+/5    Right/Left Knee Right;Left    Right Knee Flexion 4/5    Right Knee Extension 3/5    Left Knee Flexion 5/5    Left Knee Extension 5/5    Right/Left Ankle Left;Right    Right Ankle Dorsiflexion 4+/5    Left Ankle Dorsiflexion 5/5      Palpation   Palpation comment TTP R quads, no tenderness in R calf, hamstrings, medial/lateral knee      Transfers   Comments requires UE use, slow, labored      Ambulation/Gait   Ambulation/Gait Yes    Ambulation/Gait Assistance 6: Modified independent (Device/Increase time)    Ambulation Distance (Feet) 200 Feet    Assistive device Rolling walker    Gait Pattern Decreased step length - right;Decreased hip/knee flexion - right;Antalgic;Trunk flexed    Ambulation Surface Level;Indoor    Gait velocity decreased    Gait Comments 2 MWT                      Objective measurements completed on examination: See above findings.       [facility] Adult PT Treatment/Exercise - 03/15/20 0001      Exercises   Exercises Knee/Hip      Knee/Hip Exercises: Seated   Heel Slides Left;1 set;10 reps    Heel Slides Limitations 5 second holds      Knee/Hip Exercises: Supine   Quad Sets 10 reps    Quad Sets Limitations 10 second holds    Heel Slides Left;1  set;5 reps  PT Education - 03/15/20 1258    Education Details Patient educated exam findings, POC, scope of PT , initial HEP, signs of DVT, taking pain meds as directed, self STM on quads    Person(s) Educated Patient    Methods Explanation;Demonstration;Handout    Comprehension Verbalized understanding;Returned demonstration            PT Short Term Goals - 03/15/20 1344      PT SHORT TERM GOAL #1   Title Patient will be independent with HEP in order to improve functional outcomes.    Time 3    Period Weeks    Status New    Target Date 04/05/20      PT SHORT TERM GOAL #2   Title Patient will report at least 25% improvement in symptoms for improved quality of life.    Time 6    Period Weeks    Status New    Target Date 04/05/20             PT Long Term Goals - 03/15/20 1345      PT LONG TERM GOAL #1   Title Patient will report at least 75% improvement in symptoms for improved quality of life.    Time 6    Period Weeks    Status New    Target Date 04/26/20      PT LONG TERM GOAL #2   Title Patient will improve FOTO score by at least 10 points in order to indicate improved tolerance to activity.    Time 6    Period Weeks    Status New    Target Date 04/26/20      PT LONG TERM GOAL #3   Title Patient will be able to ambulate for at least 60 minutes with pain no greater than 1/10 in order to demonstrate improved ability to ambulate in the community.    Time 6    Period Weeks    Status New    Target Date 04/26/20      PT LONG TERM GOAL #4   Title Patient will be able to navigate stairs with reciprocal pattern without compensation in order to demonstrate improved LE strength.    Time 6    Period Weeks    Status New    Target Date 04/26/20                  Plan - 03/15/20 1340    Clinical Impression Statement Patient is a 69 y.o. female who presents to physical therapy s/p R TKA on 03/13/20. She presents with pain limited  deficits in R knee and LE strength, ROM, endurance, gait, balance, and functional mobility with ADL. She is having to modify and restrict ADL as indicated by FOTO score as well as subjective information and objective measures which is affecting overall participation. Patient will benefit from skilled physical therapy in order to improve function and reduce impairment.    Personal Factors and Comorbidities Comorbidity 1;Behavior Pattern;Fitness;Transportation;Time since onset of injury/illness/exacerbation    Comorbidities chronic knee pain    Examination-Activity Limitations Bathing;Bed Mobility;Bend;Caring for Others;Carry;Locomotion Level;Sit;Sleep;Squat;Stairs;Stand;Transfers    Examination-Participation Restrictions Church;Cleaning;Volunteer;Driving;Meal Prep;Shop;Yard Work    Stability/Clinical Decision Making Stable/Uncomplicated    Clinical Decision Making Low    Rehab Potential Good    PT Frequency 3x / week    PT Duration 6 weeks    PT Treatment/Interventions ADLs/Self Care Home Management;Aquatic Therapy;Biofeedback;Cryotherapy;Electrical Stimulation;Iontophoresis 4mg /ml Dexamethasone;Traction;Moist Heat;Ultrasound;Parrafin;Fluidtherapy;Contrast Bath;DME Instruction;Neuromuscular re-education;Gait training;Stair training;Functional mobility training;Therapeutic activities;Therapeutic exercise;Balance  training;Patient/family education;Orthotic Fit/Training;Manual lymph drainage;Manual techniques;Compression bandaging;Passive range of motion;Scar mobilization;Dry needling;Energy conservation;Splinting;Taping;Vasopneumatic Device;Vestibular;Spinal Manipulations;Joint Manipulations    PT Next Visit Plan assess response to initial HEP, progress R knee rom and strengthening as tolerated, possibly begin manual for pain/mobility    PT Home Exercise Plan 6/16 quad set, heel slides    Consulted and Agree with Plan of Care Patient           Patient will benefit from skilled therapeutic intervention  in order to improve the following deficits and impairments:  Abnormal gait, Decreased activity tolerance, Decreased balance, Decreased mobility, Decreased endurance, Decreased range of motion, Decreased knowledge of use of DME, Decreased skin integrity, Decreased strength, Difficulty walking, Increased edema, Increased muscle spasms, Impaired flexibility, Improper body mechanics, Pain  Visit Diagnosis: Right knee pain, unspecified chronicity  Muscle weakness (generalized)  Stiffness of right knee, not elsewhere classified  Other abnormalities of gait and mobility     Problem List Patient Active Problem List   Diagnosis Date Noted  . Status post total right knee replacement 03/13/2020  . Primary osteoarthritis of right knee 02/10/2020  . S/P right knee arthroscopy 03/03/2019  . Sciatica of left side 11/19/2018  . ANXIETY 07/20/2010  . HEMATOCHEZIA 07/20/2010    1:49 PM, 03/15/20 Mearl Latin PT, DPT Physical Therapist at [place], [place], [address] Phone: [phone]   Fax:  [phone]  Name: Julie Miles MRN: [id number] Date of Birth: 1951-05-17

## 2020-03-15 NOTE — Patient Instructions (Signed)
Access Code: [place] URL: [email or web address] Date: 03/15/2020 Prepared by: Mitzi Hansen Jhan Conery  Exercises Supine Quad Set - 5 x daily - 7 x weekly - 1 sets - 10 reps - 10 second hold Supine Heel Slide with Strap - 3 x daily - 7 x weekly - 1 sets - 10 reps - 5-10 second hold Seated Heel Slide - 3 x daily - 7 x weekly - 1 sets - 10 reps - 5-10 second hold

## 2020-03-16 ENCOUNTER — Telehealth (HOSPITAL_COMMUNITY): Payer: Self-pay

## 2020-03-16 ENCOUNTER — Ambulatory Visit (HOSPITAL_COMMUNITY): Payer: Medicare HMO

## 2020-03-16 NOTE — Telephone Encounter (Signed)
No show.  Called and spoke to pt who stated she was unaware of apt scheduled for today.  Reminded next apt date and time with verbalized understanding stating she will be here.    Ihor Austin, LPTA/CLT; Delana Meyer [phone]

## 2020-03-17 ENCOUNTER — Other Ambulatory Visit: Payer: Self-pay

## 2020-03-17 ENCOUNTER — Ambulatory Visit (HOSPITAL_COMMUNITY): Payer: Medicare HMO | Admitting: Physical Therapy

## 2020-03-17 ENCOUNTER — Encounter (HOSPITAL_COMMUNITY): Payer: Self-pay | Admitting: Physical Therapy

## 2020-03-17 DIAGNOSIS — M25561 Pain in right knee: Secondary | ICD-10-CM

## 2020-03-17 DIAGNOSIS — R2689 Other abnormalities of gait and mobility: Secondary | ICD-10-CM | POA: Diagnosis not present

## 2020-03-17 DIAGNOSIS — M6281 Muscle weakness (generalized): Secondary | ICD-10-CM

## 2020-03-17 DIAGNOSIS — M25661 Stiffness of right knee, not elsewhere classified: Secondary | ICD-10-CM | POA: Diagnosis not present

## 2020-03-17 NOTE — Therapy (Signed)
[place], [place], [address] Phone: [phone]   Fax:  [phone]  Physical Therapy Treatment  Patient Details  Name: Julie Miles MRN: [id number] Date of Birth: [birth year] Referring Provider (PT): Frankey Shown MD   Encounter Date: 03/17/2020   PT End of Session - 03/17/20 1358    Visit Number 2    Number of Visits 18    Date for PT Re-Evaluation 04/26/20    Authorization Type [employer] medicare (no visit limit, [id number]-auth)    Progress Note Due on Visit 10    PT Start Time 1346    PT Stop Time 1424    PT Time Calculation (min) 38 min    Activity Tolerance Patient tolerated treatment well    Behavior During Therapy [facility] for tasks assessed/performed           Past Medical History:  Diagnosis Date  . Anxiety   . History of blood transfusion 1975   at [facility] after vag delivery  . History of kidney stones   . Hypercholesterolemia   . Hypothyroidism   . MRSA (methicillin resistant Staphylococcus aureus) 03/08/2020   confirmed by PCR  . PONV (postoperative nausea and vomiting)     Past Surgical History:  Procedure Laterality Date  . ANTERIOR AND POSTERIOR REPAIR  06/24/2012   Procedure: ANTERIOR (CYSTOCELE) AND POSTERIOR REPAIR (RECTOCELE);  Surgeon: Daria Pastures, MD;  Location: [facility];  Service: Gynecology;  Laterality: N/A;  anterior repair  . APPENDECTOMY    . BLADDER SUSPENSION  06/24/2012   Procedure: TRANSVAGINAL TAPE (TVT) PROCEDURE;  Surgeon: Daria Pastures, MD;  Location: [facility];  Service: Gynecology;  Laterality: N/A;  . CHOLECYSTECTOMY    . CYSTOSCOPY  06/24/2012   Procedure: CYSTOSCOPY;  Surgeon: Daria Pastures, MD;  Location: [facility];  Service: Gynecology;  Laterality: N/A;  . FOOT SURGERY Right   . KNEE ARTHROSCOPY W/ MENISCAL REPAIR Right   . LAPAROSCOPY  06/24/2012   Procedure: LAPAROSCOPY OPERATIVE;  Surgeon: Daria Pastures, MD;  Location: [facility];  Service:  Gynecology;  Laterality: N/A;  . TOTAL KNEE ARTHROPLASTY Right 03/13/2020   Procedure: RIGHT TOTAL KNEE ARTHROPLASTY;  Surgeon: Leandrew Koyanagi, MD;  Location: [facility];  Service: Orthopedics;  Laterality: Right;  . TUBAL LIGATION    . VAGINAL HYSTERECTOMY  06/24/2012   Procedure: HYSTERECTOMY VAGINAL;  Surgeon: Daria Pastures, MD;  Location: [facility];  Service: Gynecology;  Laterality: N/A;    There were no vitals filed for this visit.   Subjective Assessment - 03/17/20 1349    Subjective Patient reported that her knee pain is about a 5/10.    Limitations Standing;Walking;House hold activities    How long can you walk comfortably? 5 minutes    Patient Stated Goals to improve function and get back to normal    Currently in Pain? Yes    Pain Score 5     Pain Location Knee    Pain Orientation Right    Pain Descriptors / Indicators Aching    Pain Type Surgical pain                             [facility] Adult PT Treatment/Exercise - 03/17/20 0001      Ambulation/Gait   Ambulation/Gait Yes    Ambulation Distance (Feet) 226 Feet    Assistive device Rolling walker    Gait Comments Cues  for heel to toe gait      Knee/Hip Exercises: Stretches   Knee: Self-Stretch to increase Flexion Right;10 seconds    Knee: Self-Stretch Limitations 10x, 8'' step    Gastroc Stretch Both;3 reps;30 seconds    Gastroc Stretch Limitations Bil HHA      Knee/Hip Exercises: Standing   Heel Raises 1 set;10 reps      Knee/Hip Exercises: Supine   Quad Sets 10 reps    Quad Sets Limitations 10 second holds    Heel Slides Left;1 set;10 reps    Knee Extension AROM    Knee Extension Limitations 12    Knee Flexion AROM    Knee Flexion Limitations 108      Manual Therapy   Manual Therapy Edema management    Manual therapy comments All manual therapy completed separately from other skilled interventions    Edema Management Patient supine with bil LE elevated retrograde massage to RT LE to decrease  edema                    PT Short Term Goals - 03/17/20 1350      PT SHORT TERM GOAL #1   Title Patient will be independent with HEP in order to improve functional outcomes.    Time 3    Period Weeks    Status On-going    Target Date 04/05/20      PT SHORT TERM GOAL #2   Title Patient will report at least 25% improvement in symptoms for improved quality of life.    Time 6    Period Weeks    Status On-going    Target Date 04/05/20             PT Long Term Goals - 03/17/20 1350      PT LONG TERM GOAL #1   Title Patient will report at least 75% improvement in symptoms for improved quality of life.    Time 6    Period Weeks    Status On-going      PT LONG TERM GOAL #2   Title Patient will improve FOTO score by at least 10 points in order to indicate improved tolerance to activity.    Time 6    Period Weeks    Status On-going      PT LONG TERM GOAL #3   Title Patient will be able to ambulate for at least 60 minutes with pain no greater than 1/10 in order to demonstrate improved ability to ambulate in the community.    Time 6    Period Weeks    Status On-going      PT LONG TERM GOAL #4   Title Patient will be able to navigate stairs with reciprocal pattern without compensation in order to demonstrate improved LE strength.    Time 6    Period Weeks    Status On-going                 Plan - 03/17/20 1432    Clinical Impression Statement Focused on improving patient's ROM this session. Initiated standing stretches including knee flexion stretch and gastroc stretch. Added retrograde massage to decrease edema in the right lower extremity as well. Patient's AROM ranged from 12-108 degrees this session. Educated patient throughout on iceing and following up with MD regarding wearing ted hose.    Personal Factors and Comorbidities Comorbidity 1;Behavior Pattern;Fitness;Transportation;Time since onset of injury/illness/exacerbation    Comorbidities chronic  knee pain  Examination-Activity Limitations Bathing;Bed Mobility;Bend;Caring for Others;Carry;Locomotion Level;Sit;Sleep;Squat;Stairs;Stand;Transfers    Examination-Participation Restrictions Church;Cleaning;Volunteer;Driving;Meal Prep;Shop;Yard Work    Stability/Clinical Decision Making Stable/Uncomplicated    Rehab Potential Good    PT Frequency 3x / week    PT Duration 6 weeks    PT Treatment/Interventions ADLs/Self Care Home Management;Aquatic Therapy;Biofeedback;Cryotherapy;Electrical Stimulation;Iontophoresis 4mg /ml Dexamethasone;Traction;Moist Heat;Ultrasound;Parrafin;Fluidtherapy;Contrast Bath;DME Instruction;Neuromuscular re-education;Gait training;Stair training;Functional mobility training;Therapeutic activities;Therapeutic exercise;Balance training;Patient/family education;Orthotic Fit/Training;Manual lymph drainage;Manual techniques;Compression bandaging;Passive range of motion;Scar mobilization;Dry needling;Energy conservation;Splinting;Taping;Vasopneumatic Device;Vestibular;Spinal Manipulations;Joint Manipulations    PT Next Visit Plan progress R knee rom and strengthening as tolerated, continue manual for pain/mobility and edema reduction PRN. Add hamstring stretch next session    PT Home Exercise Plan 6/16 quad set, heel slides    Consulted and Agree with Plan of Care Patient           Patient will benefit from skilled therapeutic intervention in order to improve the following deficits and impairments:  Abnormal gait, Decreased activity tolerance, Decreased balance, Decreased mobility, Decreased endurance, Decreased range of motion, Decreased knowledge of use of DME, Decreased skin integrity, Decreased strength, Difficulty walking, Increased edema, Increased muscle spasms, Impaired flexibility, Improper body mechanics, Pain  Visit Diagnosis: Right knee pain, unspecified chronicity  Muscle weakness (generalized)  Stiffness of right knee, not elsewhere classified  Other  abnormalities of gait and mobility     Problem List Patient Active Problem List   Diagnosis Date Noted  . Status post total right knee replacement 03/13/2020  . Primary osteoarthritis of right knee 02/10/2020  . S/P right knee arthroscopy 03/03/2019  . Sciatica of left side 11/19/2018  . ANXIETY 07/20/2010  . HEMATOCHEZIA 07/20/2010   Clarene Critchley PT, DPT 2:33 PM, 03/17/20 [place], [place], [address] Phone: [phone]   Fax:  [phone]  Name: DAVONNE BABY MRN: [id number] Date of Birth: [birth year]

## 2020-03-20 ENCOUNTER — Other Ambulatory Visit: Payer: Self-pay

## 2020-03-20 ENCOUNTER — Ambulatory Visit (HOSPITAL_COMMUNITY): Payer: Medicare HMO | Admitting: Physical Therapy

## 2020-03-20 DIAGNOSIS — R2689 Other abnormalities of gait and mobility: Secondary | ICD-10-CM | POA: Diagnosis not present

## 2020-03-20 DIAGNOSIS — M6281 Muscle weakness (generalized): Secondary | ICD-10-CM

## 2020-03-20 DIAGNOSIS — M25661 Stiffness of right knee, not elsewhere classified: Secondary | ICD-10-CM | POA: Diagnosis not present

## 2020-03-20 DIAGNOSIS — M25561 Pain in right knee: Secondary | ICD-10-CM

## 2020-03-20 NOTE — Therapy (Signed)
[place], [place], [address] Phone: [phone]   Fax:  [phone]  Physical Therapy Treatment  Patient Details  Name: Julie Miles MRN: [id number] Date of Birth: 1951-06-06 Referring Provider (PT): Frankey Shown MD   Encounter Date: 03/20/2020   PT End of Session - 03/20/20 1145    Visit Number 3    Number of Visits 18    Date for PT Re-Evaluation 04/26/20    Authorization Type [employer] medicare (no visit limit, [id number])    Progress Note Due on Visit 10    PT Start Time 0830    PT Stop Time 0915    PT Time Calculation (min) 45 min    Activity Tolerance Patient tolerated treatment well    Behavior During Therapy [facility] for tasks assessed/performed           Past Medical History:  Diagnosis Date  . Anxiety   . History of blood transfusion 1975   at [facility] after vag delivery  . History of kidney stones   . Hypercholesterolemia   . Hypothyroidism   . MRSA (methicillin resistant Staphylococcus aureus) 03/08/2020   confirmed by PCR  . PONV (postoperative nausea and vomiting)     Past Surgical History:  Procedure Laterality Date  . ANTERIOR AND POSTERIOR REPAIR  06/24/2012   Procedure: ANTERIOR (CYSTOCELE) AND POSTERIOR REPAIR (RECTOCELE);  Surgeon: Daria Pastures, MD;  Location: [facility];  Service: Gynecology;  Laterality: N/A;  anterior repair  . APPENDECTOMY    . BLADDER SUSPENSION  06/24/2012   Procedure: TRANSVAGINAL TAPE (TVT) PROCEDURE;  Surgeon: Daria Pastures, MD;  Location: [facility];  Service: Gynecology;  Laterality: N/A;  . CHOLECYSTECTOMY    . CYSTOSCOPY  06/24/2012   Procedure: CYSTOSCOPY;  Surgeon: Daria Pastures, MD;  Location: [facility];  Service: Gynecology;  Laterality: N/A;  . FOOT SURGERY Right   . KNEE ARTHROSCOPY W/ MENISCAL REPAIR Right   . LAPAROSCOPY  06/24/2012   Procedure: LAPAROSCOPY OPERATIVE;  Surgeon: Daria Pastures, MD;  Location: [facility];  Service:  Gynecology;  Laterality: N/A;  . TOTAL KNEE ARTHROPLASTY Right 03/13/2020   Procedure: RIGHT TOTAL KNEE ARTHROPLASTY;  Surgeon: Leandrew Koyanagi, MD;  Location: [facility];  Service: Orthopedics;  Laterality: Right;  . TUBAL LIGATION    . VAGINAL HYSTERECTOMY  06/24/2012   Procedure: HYSTERECTOMY VAGINAL;  Surgeon: Daria Pastures, MD;  Location: [facility];  Service: Gynecology;  Laterality: N/A;    There were no vitals filed for this visit.   Subjective Assessment - 03/20/20 1105    Subjective pt states she  is having alot of pain in her thigh region today.  7/10 today.                             [facility] Adult PT Treatment/Exercise - 03/20/20 0001      Ambulation/Gait   Ambulation/Gait Yes    Ambulation Distance (Feet) 226 Feet    Assistive device Rolling walker    Gait Comments Cues for heel to toe gait      Knee/Hip Exercises: Stretches   Active Hamstring Stretch Right;3 reps;20 seconds    Knee: Self-Stretch to increase Flexion Right;10 seconds    Knee: Self-Stretch Limitations 10x, 12'' step    Gastroc Stretch Both;3 reps;30 seconds    Gastroc Stretch Limitations slant board      Knee/Hip Exercises: Aerobic  Recumbent Bike seat 9 rocking 4 minutes      Knee/Hip Exercises: Standing   Heel Raises 15 reps      Knee/Hip Exercises: Supine   Quad Sets 10 reps    Short Arc Quad Sets Right;15 reps    Heel Slides Left;1 set;10 reps    Knee Extension AROM    Knee Extension Limitations 10    Knee Flexion AROM    Knee Flexion Limitations 117      Manual Therapy   Manual Therapy Edema management    Manual therapy comments All manual therapy completed separately from other skilled interventions    Edema Management Patient supine with bil LE elevated retrograde massage to RT LE to decrease edema                  PT Education - 03/20/20 1144    Education Details donning of TED hose using pinch-grab at toe.  Encouarged to contact MD if pain worsens or presists     Person(s) Educated Patient    Methods Explanation;Demonstration;Tactile cues;Verbal cues    Comprehension Verbalized understanding;Returned demonstration;Verbal cues required;Tactile cues required;Need further instruction            PT Short Term Goals - 03/17/20 1350      PT SHORT TERM GOAL #1   Title Patient will be independent with HEP in order to improve functional outcomes.    Time 3    Period Weeks    Status On-going    Target Date 04/05/20      PT SHORT TERM GOAL #2   Title Patient will report at least 25% improvement in symptoms for improved quality of life.    Time 6    Period Weeks    Status On-going    Target Date 04/05/20             PT Long Term Goals - 03/17/20 1350      PT LONG TERM GOAL #1   Title Patient will report at least 75% improvement in symptoms for improved quality of life.    Time 6    Period Weeks    Status On-going      PT LONG TERM GOAL #2   Title Patient will improve FOTO score by at least 10 points in order to indicate improved tolerance to activity.    Time 6    Period Weeks    Status On-going      PT LONG TERM GOAL #3   Title Patient will be able to ambulate for at least 60 minutes with pain no greater than 1/10 in order to demonstrate improved ability to ambulate in the community.    Time 6    Period Weeks    Status On-going      PT LONG TERM GOAL #4   Title Patient will be able to navigate stairs with reciprocal pattern without compensation in order to demonstrate improved LE strength.    Time 6    Period Weeks    Status On-going                 Plan - 03/20/20 1146    Clinical Impression Statement Noted antalgia and distress when entered clinic this session. No known reason for increased pain, however thinks it may be due to CPM.   Encouraged to notify MD if continues into tomorrow.  Continued focus primarily on ROM, adding bike at beginning of session with inability to complete full revolutions due to pain.  Manual continued to help reduce edema and pain.  ROM improved this session despite high pain 10-11G (was 12-108).  Goes next week to get bandage removed/return to MD.    Educated and demonstrated donning of TED hose as she is unable to do this on her own.    Personal Factors and Comorbidities Comorbidity 1;Behavior Pattern;Fitness;Transportation;Time since onset of injury/illness/exacerbation    Comorbidities chronic knee pain    Examination-Activity Limitations Bathing;Bed Mobility;Bend;Caring for Others;Carry;Locomotion Level;Sit;Sleep;Squat;Stairs;Stand;Transfers    Examination-Participation Restrictions Church;Cleaning;Volunteer;Driving;Meal Prep;Shop;Yard Work    Stability/Clinical Decision Making Stable/Uncomplicated    Rehab Potential Good    PT Frequency 3x / week    PT Duration 6 weeks    PT Treatment/Interventions ADLs/Self Care Home Management;Aquatic Therapy;Biofeedback;Cryotherapy;Electrical Stimulation;Iontophoresis 4mg /ml Dexamethasone;Traction;Moist Heat;Ultrasound;Parrafin;Fluidtherapy;Contrast Bath;DME Instruction;Neuromuscular re-education;Gait training;Stair training;Functional mobility training;Therapeutic activities;Therapeutic exercise;Balance training;Patient/family education;Orthotic Fit/Training;Manual lymph drainage;Manual techniques;Compression bandaging;Passive range of motion;Scar mobilization;Dry needling;Energy conservation;Splinting;Taping;Vasopneumatic Device;Vestibular;Spinal Manipulations;Joint Manipulations    PT Next Visit Plan progress R knee rom and strengthening as tolerated, continue manual for pain/mobility and edema reduction PRN. Show hamstring stretch in long sitting next session    PT Home Exercise Plan 6/16 quad set, heel slides    Consulted and Agree with Plan of Care Patient           Patient will benefit from skilled therapeutic intervention in order to improve the following deficits and impairments:  Abnormal gait, Decreased activity tolerance,  Decreased balance, Decreased mobility, Decreased endurance, Decreased range of motion, Decreased knowledge of use of DME, Decreased skin integrity, Decreased strength, Difficulty walking, Increased edema, Increased muscle spasms, Impaired flexibility, Improper body mechanics, Pain  Visit Diagnosis: Right knee pain, unspecified chronicity  Muscle weakness (generalized)  Stiffness of right knee, not elsewhere classified  Other abnormalities of gait and mobility     Problem List Patient Active Problem List   Diagnosis Date Noted  . Status post total right knee replacement 03/13/2020  . Primary osteoarthritis of right knee 02/10/2020  . S/P right knee arthroscopy 03/03/2019  . Sciatica of left side 11/19/2018  . ANXIETY 07/20/2010  . HEMATOCHEZIA 07/20/2010   Teena Irani, PTA/CLT [phone]  Teena Irani 03/20/2020, 11:50 AM  [place], [place], [address] Phone: [phone]   Fax:  [phone]  Name: Julie Miles MRN: [id number] Date of Birth: 07-May-1951

## 2020-03-21 ENCOUNTER — Encounter (HOSPITAL_COMMUNITY): Payer: Self-pay | Admitting: Physical Therapy

## 2020-03-22 ENCOUNTER — Encounter (HOSPITAL_COMMUNITY): Payer: Self-pay | Admitting: Physical Therapy

## 2020-03-22 ENCOUNTER — Other Ambulatory Visit: Payer: Self-pay

## 2020-03-22 ENCOUNTER — Ambulatory Visit (HOSPITAL_COMMUNITY): Payer: Medicare HMO | Admitting: Physical Therapy

## 2020-03-22 DIAGNOSIS — R2689 Other abnormalities of gait and mobility: Secondary | ICD-10-CM

## 2020-03-22 DIAGNOSIS — M6281 Muscle weakness (generalized): Secondary | ICD-10-CM | POA: Diagnosis not present

## 2020-03-22 DIAGNOSIS — M25661 Stiffness of right knee, not elsewhere classified: Secondary | ICD-10-CM | POA: Diagnosis not present

## 2020-03-22 DIAGNOSIS — M25561 Pain in right knee: Secondary | ICD-10-CM

## 2020-03-22 NOTE — Therapy (Signed)
[place], [place], [address] Phone: [phone]   Fax:  [phone]  Physical Therapy Treatment  Patient Details  Name: Julie Miles MRN: [id number] Date of Birth: May 28, 1951 Referring Provider (PT): Frankey Shown MD   Encounter Date: 03/22/2020   PT End of Session - 03/22/20 0906    Visit Number 4    Number of Visits 18    Date for PT Re-Evaluation 04/26/20    Authorization Type [employer] medicare (no visit limit, [id number]-auth)    Progress Note Due on Visit 10    PT Start Time 0902    PT Stop Time 0942    PT Time Calculation (min) 40 min    Activity Tolerance Patient tolerated treatment well    Behavior During Therapy [facility] for tasks assessed/performed           Past Medical History:  Diagnosis Date  . Anxiety   . History of blood transfusion 1975   at [facility] after vag delivery  . History of kidney stones   . Hypercholesterolemia   . Hypothyroidism   . MRSA (methicillin resistant Staphylococcus aureus) 03/08/2020   confirmed by PCR  . PONV (postoperative nausea and vomiting)     Past Surgical History:  Procedure Laterality Date  . ANTERIOR AND POSTERIOR REPAIR  06/24/2012   Procedure: ANTERIOR (CYSTOCELE) AND POSTERIOR REPAIR (RECTOCELE);  Surgeon: Daria Pastures, MD;  Location: [facility];  Service: Gynecology;  Laterality: N/A;  anterior repair  . APPENDECTOMY    . BLADDER SUSPENSION  06/24/2012   Procedure: TRANSVAGINAL TAPE (TVT) PROCEDURE;  Surgeon: Daria Pastures, MD;  Location: [facility];  Service: Gynecology;  Laterality: N/A;  . CHOLECYSTECTOMY    . CYSTOSCOPY  06/24/2012   Procedure: CYSTOSCOPY;  Surgeon: Daria Pastures, MD;  Location: [facility];  Service: Gynecology;  Laterality: N/A;  . FOOT SURGERY Right   . KNEE ARTHROSCOPY W/ MENISCAL REPAIR Right   . LAPAROSCOPY  06/24/2012   Procedure: LAPAROSCOPY OPERATIVE;  Surgeon: Daria Pastures, MD;  Location: [facility];  Service:  Gynecology;  Laterality: N/A;  . TOTAL KNEE ARTHROPLASTY Right 03/13/2020   Procedure: RIGHT TOTAL KNEE ARTHROPLASTY;  Surgeon: Leandrew Koyanagi, MD;  Location: [facility];  Service: Orthopedics;  Laterality: Right;  . TUBAL LIGATION    . VAGINAL HYSTERECTOMY  06/24/2012   Procedure: HYSTERECTOMY VAGINAL;  Surgeon: Daria Pastures, MD;  Location: [facility];  Service: Gynecology;  Laterality: N/A;    There were no vitals filed for this visit.   Subjective Assessment - 03/22/20 0905    Subjective Pateitn reports ongoing elevated pain in RT knee.    Currently in Pain? Yes    Pain Score 6     Pain Location Knee    Pain Orientation Right;Lateral    Pain Descriptors / Indicators Sore;Tightness    Pain Type Surgical pain                             [facility] Adult PT Treatment/Exercise - 03/22/20 0001      Knee/Hip Exercises: Stretches   Active Hamstring Stretch Right;3 reps;30 seconds    Active Hamstring Stretch Limitations seated     Knee: Self-Stretch to increase Flexion Right;5 reps;10 seconds    Knee: Self-Stretch Limitations 12'' step    Gastroc Stretch Both;3 reps;30 seconds    Gastroc Stretch Limitations slant board  Knee/Hip Exercises: Aerobic   Recumbent Bike seat 9 rocking 4 minutes      Knee/Hip Exercises: Standing   Heel Raises 15 reps    Gait Training 226 with RW     Other Standing Knee Exercises tandem satnce 2 x 30" solid floor       Knee/Hip Exercises: Seated   Sit to Sand 2 sets;10 reps;with UE support      Knee/Hip Exercises: Supine   Quad Sets Right;10 reps    Heel Slides Right;1 set;10 reps    Straight Leg Raises Right;1 set    Straight Leg Raises Limitations unable per weakness, 3 reps with assist     Knee Extension Right;AROM    Knee Extension Limitations 14    Knee Flexion Right;AROM    Knee Flexion Limitations 106    Other Supine Knee/Hip Exercises heel prop 2 min       Manual Therapy   Manual Therapy Edema management    Manual therapy  comments All manual therapy completed separately from other skilled interventions    Edema Management Patient supine with bil LE elevated retrograde massage to RT LE to decrease edema                    PT Short Term Goals - 03/17/20 1350      PT SHORT TERM GOAL #1   Title Patient will be independent with HEP in order to improve functional outcomes.    Time 3    Period Weeks    Status On-going    Target Date 04/05/20      PT SHORT TERM GOAL #2   Title Patient will report at least 25% improvement in symptoms for improved quality of life.    Time 6    Period Weeks    Status On-going    Target Date 04/05/20             PT Long Term Goals - 03/17/20 1350      PT LONG TERM GOAL #1   Title Patient will report at least 75% improvement in symptoms for improved quality of life.    Time 6    Period Weeks    Status On-going      PT LONG TERM GOAL #2   Title Patient will improve FOTO score by at least 10 points in order to indicate improved tolerance to activity.    Time 6    Period Weeks    Status On-going      PT LONG TERM GOAL #3   Title Patient will be able to ambulate for at least 60 minutes with pain no greater than 1/10 in order to demonstrate improved ability to ambulate in the community.    Time 6    Period Weeks    Status On-going      PT LONG TERM GOAL #4   Title Patient will be able to navigate stairs with reciprocal pattern without compensation in order to demonstrate improved LE strength.    Time 6    Period Weeks    Status On-going                 Plan - 03/22/20 1624    Clinical Impression Statement Patient continues to be limited by knee AROM deficits. Patient able to progress standing strengthening exercise today with minimal complaint. Patient encouraged to increased work and frequency of knee extension stretching with HEP per current restriction. Added heel prop today, educated patient on purpose and  function. Instructed patient on  addition of heel prop to HEP for improved knee extension. Patient verbalized agreement. Patient will continue to benefit from skilled therapy services to progress knee strength and mobility to reduce pain and improve functional mobility.    Personal Factors and Comorbidities Comorbidity 1;Behavior Pattern;Fitness;Transportation;Time since onset of injury/illness/exacerbation    Comorbidities chronic knee pain    Examination-Activity Limitations Bathing;Bed Mobility;Bend;Caring for Others;Carry;Locomotion Level;Sit;Sleep;Squat;Stairs;Stand;Transfers    Examination-Participation Restrictions Church;Cleaning;Volunteer;Driving;Meal Prep;Shop;Yard Work    Stability/Clinical Decision Making Stable/Uncomplicated    Rehab Potential Good    PT Frequency 3x / week    PT Duration 6 weeks    PT Treatment/Interventions ADLs/Self Care Home Management;Aquatic Therapy;Biofeedback;Cryotherapy;Electrical Stimulation;Iontophoresis 4mg /ml Dexamethasone;Traction;Moist Heat;Ultrasound;Parrafin;Fluidtherapy;Contrast Bath;DME Instruction;Neuromuscular re-education;Gait training;Stair training;Functional mobility training;Therapeutic activities;Therapeutic exercise;Balance training;Patient/family education;Orthotic Fit/Training;Manual lymph drainage;Manual techniques;Compression bandaging;Passive range of motion;Scar mobilization;Dry needling;Energy conservation;Splinting;Taping;Vasopneumatic Device;Vestibular;Spinal Manipulations;Joint Manipulations    PT Next Visit Plan progress R knee rom and strengthening as tolerated, continue manual for pain/mobility and edema reduction PRN. Show hamstring stretch in long sitting next session    PT Home Exercise Plan 6/16 quad set, heel slides 03/22/20: heel prop    Consulted and Agree with Plan of Care Patient           Patient will benefit from skilled therapeutic intervention in order to improve the following deficits and impairments:  Abnormal gait, Decreased activity tolerance,  Decreased balance, Decreased mobility, Decreased endurance, Decreased range of motion, Decreased knowledge of use of DME, Decreased skin integrity, Decreased strength, Difficulty walking, Increased edema, Increased muscle spasms, Impaired flexibility, Improper body mechanics, Pain  Visit Diagnosis: Right knee pain, unspecified chronicity  Muscle weakness (generalized)  Stiffness of right knee, not elsewhere classified  Other abnormalities of gait and mobility     Problem List Patient Active Problem List   Diagnosis Date Noted  . Status post total right knee replacement 03/13/2020  . Primary osteoarthritis of right knee 02/10/2020  . S/P right knee arthroscopy 03/03/2019  . Sciatica of left side 11/19/2018  . ANXIETY 07/20/2010  . HEMATOCHEZIA 07/20/2010    4:37 PM, 03/22/20 Josue Hector PT DPT  Physical Therapist with [facility]  [phone] [place] [address] [place], [place], [address] Phone: [phone]   Fax:  [phone]  Name: Julie Miles MRN: [id number] Date of Birth: [birth year]

## 2020-03-24 ENCOUNTER — Ambulatory Visit (HOSPITAL_COMMUNITY): Payer: Medicare HMO | Admitting: Physical Therapy

## 2020-03-24 ENCOUNTER — Encounter (HOSPITAL_COMMUNITY): Payer: Self-pay | Admitting: Physical Therapy

## 2020-03-24 ENCOUNTER — Other Ambulatory Visit: Payer: Self-pay

## 2020-03-24 DIAGNOSIS — R2689 Other abnormalities of gait and mobility: Secondary | ICD-10-CM

## 2020-03-24 DIAGNOSIS — M25561 Pain in right knee: Secondary | ICD-10-CM | POA: Diagnosis not present

## 2020-03-24 DIAGNOSIS — M6281 Muscle weakness (generalized): Secondary | ICD-10-CM | POA: Diagnosis not present

## 2020-03-24 DIAGNOSIS — M25661 Stiffness of right knee, not elsewhere classified: Secondary | ICD-10-CM | POA: Diagnosis not present

## 2020-03-24 NOTE — Therapy (Signed)
[place], [place], [address] Phone: [phone]   Fax:  [phone]  Physical Therapy Treatment  Patient Details  Name: Julie Miles MRN: [id number] Date of Birth: 11-18-1950 Referring Provider (PT): Frankey Shown MD   Encounter Date: 03/24/2020   PT End of Session - 03/24/20 1312    Visit Number 5    Number of Visits 18    Date for PT Re-Evaluation 04/26/20    Authorization Type [employer] medicare (no visit limit, [id number])    Progress Note Due on Visit 10    PT Start Time 1318    PT Stop Time 1400    PT Time Calculation (min) 42 min    Equipment Utilized During Treatment Gait belt    Activity Tolerance Patient tolerated treatment well    Behavior During Therapy [facility] for tasks assessed/performed           Past Medical History:  Diagnosis Date  . Anxiety   . History of blood transfusion 1975   at [facility] after vag delivery  . History of kidney stones   . Hypercholesterolemia   . Hypothyroidism   . MRSA (methicillin resistant Staphylococcus aureus) 03/08/2020   confirmed by PCR  . PONV (postoperative nausea and vomiting)     Past Surgical History:  Procedure Laterality Date  . ANTERIOR AND POSTERIOR REPAIR  06/24/2012   Procedure: ANTERIOR (CYSTOCELE) AND POSTERIOR REPAIR (RECTOCELE);  Surgeon: Daria Pastures, MD;  Location: [facility];  Service: Gynecology;  Laterality: N/A;  anterior repair  . APPENDECTOMY    . BLADDER SUSPENSION  06/24/2012   Procedure: TRANSVAGINAL TAPE (TVT) PROCEDURE;  Surgeon: Daria Pastures, MD;  Location: [facility];  Service: Gynecology;  Laterality: N/A;  . CHOLECYSTECTOMY    . CYSTOSCOPY  06/24/2012   Procedure: CYSTOSCOPY;  Surgeon: Daria Pastures, MD;  Location: [facility];  Service: Gynecology;  Laterality: N/A;  . FOOT SURGERY Right   . KNEE ARTHROSCOPY W/ MENISCAL REPAIR Right   . LAPAROSCOPY  06/24/2012   Procedure: LAPAROSCOPY OPERATIVE;  Surgeon:  Daria Pastures, MD;  Location: [facility];  Service: Gynecology;  Laterality: N/A;  . TOTAL KNEE ARTHROPLASTY Right 03/13/2020   Procedure: RIGHT TOTAL KNEE ARTHROPLASTY;  Surgeon: Leandrew Koyanagi, MD;  Location: [facility];  Service: Orthopedics;  Laterality: Right;  . TUBAL LIGATION    . VAGINAL HYSTERECTOMY  06/24/2012   Procedure: HYSTERECTOMY VAGINAL;  Surgeon: Daria Pastures, MD;  Location: [facility];  Service: Gynecology;  Laterality: N/A;    There were no vitals filed for this visit.   Subjective Assessment - 03/24/20 1312    Subjective Pt states that her knee is alright, she is still having endurance difficulty but she was having this prior to her knee operation.    Limitations Standing;Walking;House hold activities    How long can you walk comfortably? 5 minutes    Patient Stated Goals to improve function and get back to normal    Currently in Pain? Yes    Pain Score 5     Pain Location Knee    Pain Orientation Right    Pain Descriptors / Indicators Tightness;Sore    Pain Type Surgical pain    Pain Onset 1 to 4 weeks ago    Pain Frequency Constant    Aggravating Factors  bending    Pain Relieving Factors elevation    Effect of Pain on Daily Activities limits  [facility] PT Treatment/Exercise - 03/24/20 0001      Knee/Hip Exercises: Stretches   Passive Hamstring Stretch Right;3 reps;30 seconds    Passive Hamstring Stretch Limitations on 12" step     Knee: Self-Stretch to increase Flexion Right;5 reps;10 seconds    Knee: Self-Stretch Limitations 12'' step    Gastroc Stretch Both;3 reps;30 seconds    Gastroc Stretch Limitations slant board      Knee/Hip Exercises: Aerobic   Recumbent Bike seat 9 rocking 4 minutes      Knee/Hip Exercises: Standing   Heel Raises 10 reps    Knee Flexion Right;10 reps    Terminal Knee Extension Right;5 reps      Knee/Hip Exercises: Seated   Long Arc Quad 10 reps    Heel Slides 10 reps       Knee/Hip Exercises: Supine   Quad Sets Right;15 reps    Heel Slides 5 reps    Straight Leg Raises 5 reps    Knee Extension Limitations 13    Knee Flexion Limitations 115      Manual Therapy   Manual Therapy Edema management    Manual therapy comments All manual therapy completed separately from other skilled interventions    Edema Management decongestive techniques used to decrease edema                   PT Education - 03/24/20 1405    Education Details To complete 10 quad sets every hour over the weekend to promote extension    Person(s) Educated Patient    Methods Explanation;Demonstration    Comprehension Verbalized understanding            PT Short Term Goals - 03/17/20 1350      PT SHORT TERM GOAL #1   Title Patient will be independent with HEP in order to improve functional outcomes.    Time 3    Period Weeks    Status On-going    Target Date 04/05/20      PT SHORT TERM GOAL #2   Title Patient will report at least 25% improvement in symptoms for improved quality of life.    Time 6    Period Weeks    Status On-going    Target Date 04/05/20             PT Long Term Goals - 03/17/20 1350      PT LONG TERM GOAL #1   Title Patient will report at least 75% improvement in symptoms for improved quality of life.    Time 6    Period Weeks    Status On-going      PT LONG TERM GOAL #2   Title Patient will improve FOTO score by at least 10 points in order to indicate improved tolerance to activity.    Time 6    Period Weeks    Status On-going      PT LONG TERM GOAL #3   Title Patient will be able to ambulate for at least 60 minutes with pain no greater than 1/10 in order to demonstrate improved ability to ambulate in the community.    Time 6    Period Weeks    Status On-going      PT LONG TERM GOAL #4   Title Patient will be able to navigate stairs with reciprocal pattern without compensation in order to demonstrate improved LE strength.    Time 6     Period Weeks    Status  On-going                 Plan - 03/24/20 1406    Clinical Impression Statement Pt improving in flexion but continues to remain limited in extension.  Began standing terminal extension and requested pt to complete 10 qsets every waking hour to assist in improving extension.  Began standing activity with good tolerance, pt able to complete SLR I but with an extension lag.    Personal Factors and Comorbidities Comorbidity 1;Behavior Pattern;Fitness;Transportation;Time since onset of injury/illness/exacerbation    Comorbidities chronic knee pain    Examination-Activity Limitations Bathing;Bed Mobility;Bend;Caring for Others;Carry;Locomotion Level;Sit;Sleep;Squat;Stairs;Stand;Transfers    Examination-Participation Restrictions Church;Cleaning;Volunteer;Driving;Meal Prep;Shop;Yard Work    Stability/Clinical Decision Making Stable/Uncomplicated    Rehab Potential Good    PT Frequency 3x / week    PT Duration 6 weeks    PT Treatment/Interventions ADLs/Self Care Home Management;Aquatic Therapy;Biofeedback;Cryotherapy;Electrical Stimulation;Iontophoresis 4mg /ml Dexamethasone;Traction;Moist Heat;Ultrasound;Parrafin;Fluidtherapy;Contrast Bath;DME Instruction;Neuromuscular re-education;Gait training;Stair training;Functional mobility training;Therapeutic activities;Therapeutic exercise;Balance training;Patient/family education;Orthotic Fit/Training;Manual lymph drainage;Manual techniques;Compression bandaging;Passive range of motion;Scar mobilization;Dry needling;Energy conservation;Splinting;Taping;Vasopneumatic Device;Vestibular;Spinal Manipulations;Joint Manipulations    PT Next Visit Plan progress R knee rom and strengthening as tolerated, continue manual for pain/mobility and edema reduction PRN. Attempt gt train with cane.    PT Home Exercise Plan 6/16 quad set, heel slides 03/22/20: heel prop    Consulted and Agree with Plan of Care Patient           Patient will  benefit from skilled therapeutic intervention in order to improve the following deficits and impairments:  Abnormal gait, Decreased activity tolerance, Decreased balance, Decreased mobility, Decreased endurance, Decreased range of motion, Decreased knowledge of use of DME, Decreased skin integrity, Decreased strength, Difficulty walking, Increased edema, Increased muscle spasms, Impaired flexibility, Improper body mechanics, Pain  Visit Diagnosis: Right knee pain, unspecified chronicity  Muscle weakness (generalized)  Stiffness of right knee, not elsewhere classified  Other abnormalities of gait and mobility     Problem List Patient Active Problem List   Diagnosis Date Noted  . Status post total right knee replacement 03/13/2020  . Primary osteoarthritis of right knee 02/10/2020  . S/P right knee arthroscopy 03/03/2019  . Sciatica of left side 11/19/2018  . ANXIETY 07/20/2010  . HEMATOCHEZIA 07/20/2010    Rayetta Humphrey, PT CLT [phone] 03/24/2020, 2:09 PM  [place] [address] [place], [place], [address] Phone: [phone]   Fax:  [phone]  Name: Julie Miles MRN: [id number] Date of Birth: 08-Jun-1951

## 2020-03-27 ENCOUNTER — Telehealth (HOSPITAL_COMMUNITY): Payer: Self-pay | Admitting: Physical Therapy

## 2020-03-27 NOTE — Telephone Encounter (Signed)
Pt can not be here this Friday - she will keep her 2xwk this week and no r/s this third apptment

## 2020-03-28 ENCOUNTER — Encounter: Payer: Self-pay | Admitting: Orthopaedic Surgery

## 2020-03-28 ENCOUNTER — Other Ambulatory Visit: Payer: Self-pay

## 2020-03-28 ENCOUNTER — Ambulatory Visit (INDEPENDENT_AMBULATORY_CARE_PROVIDER_SITE_OTHER): Payer: Medicare HMO | Admitting: Orthopaedic Surgery

## 2020-03-28 ENCOUNTER — Encounter (HOSPITAL_COMMUNITY): Payer: Self-pay

## 2020-03-28 ENCOUNTER — Ambulatory Visit (HOSPITAL_COMMUNITY): Payer: Medicare HMO

## 2020-03-28 VITALS — Ht 66.0 in | Wt 218.0 lb

## 2020-03-28 DIAGNOSIS — M25561 Pain in right knee: Secondary | ICD-10-CM

## 2020-03-28 DIAGNOSIS — M25661 Stiffness of right knee, not elsewhere classified: Secondary | ICD-10-CM

## 2020-03-28 DIAGNOSIS — M6281 Muscle weakness (generalized): Secondary | ICD-10-CM | POA: Diagnosis not present

## 2020-03-28 DIAGNOSIS — R2689 Other abnormalities of gait and mobility: Secondary | ICD-10-CM

## 2020-03-28 DIAGNOSIS — Z96651 Presence of right artificial knee joint: Secondary | ICD-10-CM

## 2020-03-28 MED ORDER — TRAMADOL HCL 50 MG PO TABS: 50.0000 mg | ORAL_TABLET | Freq: Every day | ORAL | 0 refills | Status: DC | PRN

## 2020-03-28 NOTE — Therapy (Signed)
[place], [place], [address] Phone: [phone]   Fax:  [phone]  Physical Therapy Treatment  Patient Details  Name: Julie Miles MRN: [id number] Date of Birth: 10-Oct-1950 Referring Provider (PT): Frankey Shown MD   Encounter Date: 03/28/2020   PT End of Session - 03/28/20 1408    Visit Number 6    Number of Visits 18    Date for PT Re-Evaluation 04/26/20    Authorization Type [employer] medicare (no visit limit, [id number]-auth)    Progress Note Due on Visit 10    PT Start Time 1403   4' on bike, not included with charges   PT Stop Time 1446    PT Time Calculation (min) 43 min    Activity Tolerance Patient tolerated treatment well    Behavior During Therapy [facility] for tasks assessed/performed           Past Medical History:  Diagnosis Date  . Anxiety   . History of blood transfusion 1975   at [facility] after vag delivery  . History of kidney stones   . Hypercholesterolemia   . Hypothyroidism   . MRSA (methicillin resistant Staphylococcus aureus) 03/08/2020   confirmed by PCR  . PONV (postoperative nausea and vomiting)     Past Surgical History:  Procedure Laterality Date  . ANTERIOR AND POSTERIOR REPAIR  06/24/2012   Procedure: ANTERIOR (CYSTOCELE) AND POSTERIOR REPAIR (RECTOCELE);  Surgeon: Daria Pastures, MD;  Location: [facility];  Service: Gynecology;  Laterality: N/A;  anterior repair  . APPENDECTOMY    . BLADDER SUSPENSION  06/24/2012   Procedure: TRANSVAGINAL TAPE (TVT) PROCEDURE;  Surgeon: Daria Pastures, MD;  Location: [facility];  Service: Gynecology;  Laterality: N/A;  . CHOLECYSTECTOMY    . CYSTOSCOPY  06/24/2012   Procedure: CYSTOSCOPY;  Surgeon: Daria Pastures, MD;  Location: [facility];  Service: Gynecology;  Laterality: N/A;  . FOOT SURGERY Right   . KNEE ARTHROSCOPY W/ MENISCAL REPAIR Right   . LAPAROSCOPY  06/24/2012   Procedure: LAPAROSCOPY OPERATIVE;  Surgeon: Daria Pastures, MD;  Location: [facility];  Service: Gynecology;  Laterality: N/A;  . TOTAL KNEE ARTHROPLASTY Right 03/13/2020   Procedure: RIGHT TOTAL KNEE ARTHROPLASTY;  Surgeon: Leandrew Koyanagi, MD;  Location: [facility];  Service: Orthopedics;  Laterality: Right;  . TUBAL LIGATION    . VAGINAL HYSTERECTOMY  06/24/2012   Procedure: HYSTERECTOMY VAGINAL;  Surgeon: Daria Pastures, MD;  Location: [facility];  Service: Gynecology;  Laterality: N/A;    There were no vitals filed for this visit.   Subjective Assessment - 03/28/20 1407    Subjective Staples removed at MD office today, pt stated she is progressing well.  Pain scale 2/10 Rt knee today.  Feels like she walks better without SPC.    Patient Stated Goals to improve function and get back to normal    Currently in Pain? Yes    Pain Score 2     Pain Location Knee    Pain Orientation Right    Pain Descriptors / Indicators Sore;Tightness    Pain Type Surgical pain    Pain Onset 1 to 4 weeks ago    Pain Frequency Constant    Aggravating Factors  bending    Pain Relieving Factors elevation    Effect of Pain on Daily Activities limits  [facility] Adult PT Treatment/Exercise - 03/28/20 0001      Ambulation/Gait   Ambulation/Gait Yes    Ambulation/Gait Assistance 6: Modified independent (Device/Increase time)    Ambulation Distance (Feet) 226 Feet    Assistive device None    Gait Pattern Decreased step length - right;Decreased hip/knee flexion - right;Antalgic;Trunk flexed    Gait Comments cueing for heel strike and equal stance phase      Knee/Hip Exercises: Stretches   Knee: Self-Stretch to increase Flexion Right;5 reps;10 seconds    Knee: Self-Stretch Limitations 12'' step    Gastroc Stretch Both;3 reps;30 seconds    Gastroc Stretch Limitations slant board      Knee/Hip Exercises: Aerobic   Recumbent Bike seat 9 rocking 4 minutes      Knee/Hip Exercises: Standing   Heel Raises 10 reps    Heel Raises  Limitations toe raises on slope    Terminal Knee Extension Right;10 reps;Theraband    Theraband Level (Terminal Knee Extension) Level 2 (Red)    Terminal Knee Extension Limitations 5" holds    Gait Training no AD x 226; cueing for heel strike      Knee/Hip Exercises: Supine   Knee Extension Right;AROM    Knee Extension Limitations 11    Knee Flexion Right;AROM    Knee Flexion Limitations 116      Knee/Hip Exercises: Prone   Other Prone Exercises TKE 10x 5" holds      Manual Therapy   Manual Therapy Edema management    Manual therapy comments All manual therapy completed separately from other skilled interventions    Edema Management decongestive techniques used to decrease edema                     PT Short Term Goals - 03/17/20 1350      PT SHORT TERM GOAL #1   Title Patient will be independent with HEP in order to improve functional outcomes.    Time 3    Period Weeks    Status On-going    Target Date 04/05/20      PT SHORT TERM GOAL #2   Title Patient will report at least 25% improvement in symptoms for improved quality of life.    Time 6    Period Weeks    Status On-going    Target Date 04/05/20             PT Long Term Goals - 03/17/20 1350      PT LONG TERM GOAL #1   Title Patient will report at least 75% improvement in symptoms for improved quality of life.    Time 6    Period Weeks    Status On-going      PT LONG TERM GOAL #2   Title Patient will improve FOTO score by at least 10 points in order to indicate improved tolerance to activity.    Time 6    Period Weeks    Status On-going      PT LONG TERM GOAL #3   Title Patient will be able to ambulate for at least 60 minutes with pain no greater than 1/10 in order to demonstrate improved ability to ambulate in the community.    Time 6    Period Weeks    Status On-going      PT LONG TERM GOAL #4   Title Patient will be able to navigate stairs with reciprocal pattern without compensation in  order to demonstrate improved LE strength.  Time 6    Period Weeks    Status On-going                 Plan - 03/28/20 1431    Clinical Impression Statement Continued session focus with knee mobility.  Added extension based exercises to address limitation.  AROM making improvements at 11-116 degrees.  EOS with manual decognitive techqniues to address edema.    Personal Factors and Comorbidities Comorbidity 1;Behavior Pattern;Fitness;Transportation;Time since onset of injury/illness/exacerbation    Comorbidities chronic knee pain    Examination-Activity Limitations Bathing;Bed Mobility;Bend;Caring for Others;Carry;Locomotion Level;Sit;Sleep;Squat;Stairs;Stand;Transfers    Examination-Participation Restrictions Church;Cleaning;Volunteer;Driving;Meal Prep;Shop;Yard Work    Stability/Clinical Decision Making Stable/Uncomplicated    Clinical Decision Making Low    Rehab Potential Good    PT Frequency 3x / week    PT Duration 6 weeks    PT Treatment/Interventions ADLs/Self Care Home Management;Aquatic Therapy;Biofeedback;Cryotherapy;Electrical Stimulation;Iontophoresis 4mg /ml Dexamethasone;Traction;Moist Heat;Ultrasound;Parrafin;Fluidtherapy;Contrast Bath;DME Instruction;Neuromuscular re-education;Gait training;Stair training;Functional mobility training;Therapeutic activities;Therapeutic exercise;Balance training;Patient/family education;Orthotic Fit/Training;Manual lymph drainage;Manual techniques;Compression bandaging;Passive range of motion;Scar mobilization;Dry needling;Energy conservation;Splinting;Taping;Vasopneumatic Device;Vestibular;Spinal Manipulations;Joint Manipulations    PT Next Visit Plan progress R knee rom and strengthening as tolerated, continue manual for pain/mobility and edema reduction PRN. Attempt gt train without cane.    PT Home Exercise Plan 6/16 quad set, heel slides 03/22/20: heel prop           Patient will benefit from skilled therapeutic intervention in  order to improve the following deficits and impairments:  Abnormal gait, Decreased activity tolerance, Decreased balance, Decreased mobility, Decreased endurance, Decreased range of motion, Decreased knowledge of use of DME, Decreased skin integrity, Decreased strength, Difficulty walking, Increased edema, Increased muscle spasms, Impaired flexibility, Improper body mechanics, Pain  Visit Diagnosis: Right knee pain, unspecified chronicity  Muscle weakness (generalized)  Stiffness of right knee, not elsewhere classified  Other abnormalities of gait and mobility     Problem List Patient Active Problem List   Diagnosis Date Noted  . Status post total right knee replacement 03/13/2020  . Primary osteoarthritis of right knee 02/10/2020  . S/P right knee arthroscopy 03/03/2019  . Sciatica of left side 11/19/2018  . ANXIETY 07/20/2010  . HEMATOCHEZIA 07/20/2010   Ihor Austin, LPTA/CLT; CBIS [phone]  Aldona Lento 03/28/2020, 6:39 PM  [facility] [address] [place], [place], [address] Phone: [phone]   Fax:  [phone]  Name: JYLA HOPF MRN: [id number] Date of Birth: 01-May-1951

## 2020-03-28 NOTE — Progress Notes (Signed)
Post-Op Visit Note   Patient: Julie Miles           Date of Birth: 01/01/1951           MRN: [id number] Visit Date: 03/28/2020 PCP: Sharilyn Sites, MD   Assessment & Plan:  Chief Complaint:  Chief Complaint  Patient presents with  . Right Knee - Routine Post Op    03/13/20 RTKA   Visit Diagnoses:  1. Status post total right knee replacement     Plan: Eriona is 2 weeks status post right total knee replacement.  Overall doing well has no real complaints.  Currently doing outpatient PT and any pain.  Surgical incision is healed without any signs of infection.  Range of motion is progressing well.  Ambulating with a cane.  She will continue outpatient PT.  We will continue aspirin for DVT prophylaxis.  Follow-up in 4 weeks with two-view x-rays of the right knee.  Follow-Up Instructions: Return in about 4 weeks (around 04/25/2020).   Orders:  No orders of the defined types were placed in this encounter.  Meds ordered this encounter  Medications  . traMADol (ULTRAM) 50 MG tablet    Sig: Take 1-2 tablets (50-100 mg total) by mouth daily as needed.    Dispense:  30 tablet    Refill:  0    Imaging: No results found.  PMFS History: Patient Active Problem List   Diagnosis Date Noted  . Status post total right knee replacement 03/13/2020  . Primary osteoarthritis of right knee 02/10/2020  . S/P right knee arthroscopy 03/03/2019  . Sciatica of left side 11/19/2018  . ANXIETY 07/20/2010  . HEMATOCHEZIA 07/20/2010   Past Medical History:  Diagnosis Date  . Anxiety   . History of blood transfusion 1975   at [facility] after vag delivery  . History of kidney stones   . Hypercholesterolemia   . Hypothyroidism   . MRSA (methicillin resistant Staphylococcus aureus) 03/08/2020   confirmed by PCR  . PONV (postoperative nausea and vomiting)     Family History  Problem Relation Age of Onset  . Heart disease Mother   . Diabetes Mother     Past Surgical  History:  Procedure Laterality Date  . ANTERIOR AND POSTERIOR REPAIR  06/24/2012   Procedure: ANTERIOR (CYSTOCELE) AND POSTERIOR REPAIR (RECTOCELE);  Surgeon: Daria Pastures, MD;  Location: [facility];  Service: Gynecology;  Laterality: N/A;  anterior repair  . APPENDECTOMY    . BLADDER SUSPENSION  06/24/2012   Procedure: TRANSVAGINAL TAPE (TVT) PROCEDURE;  Surgeon: Daria Pastures, MD;  Location: [facility];  Service: Gynecology;  Laterality: N/A;  . CHOLECYSTECTOMY    . CYSTOSCOPY  06/24/2012   Procedure: CYSTOSCOPY;  Surgeon: Daria Pastures, MD;  Location: [facility];  Service: Gynecology;  Laterality: N/A;  . FOOT SURGERY Right   . KNEE ARTHROSCOPY W/ MENISCAL REPAIR Right   . LAPAROSCOPY  06/24/2012   Procedure: LAPAROSCOPY OPERATIVE;  Surgeon: Daria Pastures, MD;  Location: [facility];  Service: Gynecology;  Laterality: N/A;  . TOTAL KNEE ARTHROPLASTY Right 03/13/2020   Procedure: RIGHT TOTAL KNEE ARTHROPLASTY;  Surgeon: Leandrew Koyanagi, MD;  Location: [facility];  Service: Orthopedics;  Laterality: Right;  . TUBAL LIGATION    . VAGINAL HYSTERECTOMY  06/24/2012   Procedure: HYSTERECTOMY VAGINAL;  Surgeon: Daria Pastures, MD;  Location: [facility];  Service: Gynecology;  Laterality: N/A;   Social History   Occupational History  . Not on  file  Tobacco Use  . Smoking status: Former Smoker    Quit date: 2006    Years since quitting: 15.5  . Smokeless tobacco: Never Used  . Tobacco comment: STATES SHE DOES NOT SMOKE.  Vaping Use  . Vaping Use: Never used  Substance and Sexual Activity  . Alcohol use: No  . Drug use: No  . Sexual activity: Not on file

## 2020-03-29 ENCOUNTER — Ambulatory Visit (HOSPITAL_COMMUNITY): Payer: Medicare HMO | Admitting: Physical Therapy

## 2020-03-29 DIAGNOSIS — M25561 Pain in right knee: Secondary | ICD-10-CM | POA: Diagnosis not present

## 2020-03-29 DIAGNOSIS — E039 Hypothyroidism, unspecified: Secondary | ICD-10-CM | POA: Diagnosis not present

## 2020-03-29 DIAGNOSIS — M25661 Stiffness of right knee, not elsewhere classified: Secondary | ICD-10-CM | POA: Diagnosis not present

## 2020-03-29 DIAGNOSIS — E7849 Other hyperlipidemia: Secondary | ICD-10-CM | POA: Diagnosis not present

## 2020-03-29 DIAGNOSIS — M6281 Muscle weakness (generalized): Secondary | ICD-10-CM

## 2020-03-29 DIAGNOSIS — R2689 Other abnormalities of gait and mobility: Secondary | ICD-10-CM | POA: Diagnosis not present

## 2020-03-29 NOTE — Therapy (Signed)
[place], [place], [address] Phone: [phone]   Fax:  [phone]  Physical Therapy Treatment  Patient Details  Name: Julie Miles MRN: [id number] Date of Birth: September 25, 1951 Referring Provider (PT): Frankey Shown MD   Encounter Date: 03/29/2020   PT End of Session - 03/29/20 1144    Visit Number 7    Number of Visits 18    Date for PT Re-Evaluation 04/26/20    Authorization Type [employer] medicare (no visit limit, [id number])    Progress Note Due on Visit 10    PT Start Time 0915    PT Stop Time 1000    PT Time Calculation (min) 45 min    Activity Tolerance Patient tolerated treatment well    Behavior During Therapy [facility] for tasks assessed/performed           Past Medical History:  Diagnosis Date  . Anxiety   . History of blood transfusion 1975   at [facility] after vag delivery  . History of kidney stones   . Hypercholesterolemia   . Hypothyroidism   . MRSA (methicillin resistant Staphylococcus aureus) 03/08/2020   confirmed by PCR  . PONV (postoperative nausea and vomiting)     Past Surgical History:  Procedure Laterality Date  . ANTERIOR AND POSTERIOR REPAIR  06/24/2012   Procedure: ANTERIOR (CYSTOCELE) AND POSTERIOR REPAIR (RECTOCELE);  Surgeon: Daria Pastures, MD;  Location: [facility];  Service: Gynecology;  Laterality: N/A;  anterior repair  . APPENDECTOMY    . BLADDER SUSPENSION  06/24/2012   Procedure: TRANSVAGINAL TAPE (TVT) PROCEDURE;  Surgeon: Daria Pastures, MD;  Location: [facility];  Service: Gynecology;  Laterality: N/A;  . CHOLECYSTECTOMY    . CYSTOSCOPY  06/24/2012   Procedure: CYSTOSCOPY;  Surgeon: Daria Pastures, MD;  Location: [facility];  Service: Gynecology;  Laterality: N/A;  . FOOT SURGERY Right   . KNEE ARTHROSCOPY W/ MENISCAL REPAIR Right   . LAPAROSCOPY  06/24/2012   Procedure: LAPAROSCOPY OPERATIVE;  Surgeon: Daria Pastures, MD;  Location: [facility];  Service:  Gynecology;  Laterality: N/A;  . TOTAL KNEE ARTHROPLASTY Right 03/13/2020   Procedure: RIGHT TOTAL KNEE ARTHROPLASTY;  Surgeon: Leandrew Koyanagi, MD;  Location: [place];  Service: Orthopedics;  Laterality: Right;  . TUBAL LIGATION    . VAGINAL HYSTERECTOMY  06/24/2012   Procedure: HYSTERECTOMY VAGINAL;  Surgeon: Daria Pastures, MD;  Location: [facility];  Service: Gynecology;  Laterality: N/A;    There were no vitals filed for this visit.   Subjective Assessment - 03/29/20 0956    Subjective Pt states it is about the same today. Still unable to make full revolutions.  2/10.    Currently in Pain? Yes    Pain Score 2     Pain Location Knee    Pain Orientation Right    Pain Descriptors / Indicators Sore                             [facility] Adult PT Treatment/Exercise - 03/29/20 0001      Knee/Hip Exercises: Stretches   Active Hamstring Stretch Right;3 reps;30 seconds    Active Hamstring Stretch Limitations 12" step    Knee: Self-Stretch to increase Flexion Right;5 reps;10 seconds    Knee: Self-Stretch Limitations 12'' step    Gastroc Stretch Both;3 reps;30 seconds    Gastroc Stretch Limitations slant board  Knee/Hip Exercises: Aerobic   Recumbent Bike seat 9 rocking 4 minutes      Knee/Hip Exercises: Standing   Heel Raises 20 reps    Knee Flexion Right;15 reps      Knee/Hip Exercises: Supine   Straight Leg Raises 10 reps    Knee Extension Right;AROM    Knee Extension Limitations 11    Knee Flexion Right;AROM    Knee Flexion Limitations 120      Knee/Hip Exercises: Prone   Hamstring Curl 15 reps    Prone Knee Hang 2 minutes    Prone Knee Hang Weights (lbs) with manual     Other Prone Exercises TKE 10x 5" holds      Manual Therapy   Manual Therapy Soft tissue mobilization    Manual therapy comments All manual therapy completed separately from other skilled interventions    Soft tissue mobilization to posterior Rt knee                    PT  Short Term Goals - 03/17/20 1350      PT SHORT TERM GOAL #1   Title Patient will be independent with HEP in order to improve functional outcomes.    Time 3    Period Weeks    Status On-going    Target Date 04/05/20      PT SHORT TERM GOAL #2   Title Patient will report at least 25% improvement in symptoms for improved quality of life.    Time 6    Period Weeks    Status On-going    Target Date 04/05/20             PT Long Term Goals - 03/17/20 1350      PT LONG TERM GOAL #1   Title Patient will report at least 75% improvement in symptoms for improved quality of life.    Time 6    Period Weeks    Status On-going      PT LONG TERM GOAL #2   Title Patient will improve FOTO score by at least 10 points in order to indicate improved tolerance to activity.    Time 6    Period Weeks    Status On-going      PT LONG TERM GOAL #3   Title Patient will be able to ambulate for at least 60 minutes with pain no greater than 1/10 in order to demonstrate improved ability to ambulate in the community.    Time 6    Period Weeks    Status On-going      PT LONG TERM GOAL #4   Title Patient will be able to navigate stairs with reciprocal pattern without compensation in order to demonstrate improved LE strength.    Time 6    Period Weeks    Status On-going                 Plan - 03/29/20 1144    Clinical Impression Statement Pt appears to be improving with less c/o pain and painful behaviors during session today.  Still unable to make full revolutions on bike at this point, however has 120 degrees flexion in knee.  Pt requires assistance with exercise count as tends to go over count. Noted difficulty with hamstring curls in prone due to weakness. Manual completed to posterior knee with patient reporting improvement in pain symptoms following.  Extension tends to be most limited at this point.    Personal Factors and Comorbidities  Comorbidity 1;Behavior  Pattern;Fitness;Transportation;Time since onset of injury/illness/exacerbation    Comorbidities chronic knee pain    Examination-Activity Limitations Bathing;Bed Mobility;Bend;Caring for Others;Carry;Locomotion Level;Sit;Sleep;Squat;Stairs;Stand;Transfers    Examination-Participation Restrictions Church;Cleaning;Volunteer;Driving;Meal Prep;Shop;Yard Work    Stability/Clinical Decision Making Stable/Uncomplicated    Rehab Potential Good    PT Frequency 3x / week    PT Duration 6 weeks    PT Treatment/Interventions ADLs/Self Care Home Management;Aquatic Therapy;Biofeedback;Cryotherapy;Electrical Stimulation;Iontophoresis 4mg /ml Dexamethasone;Traction;Moist Heat;Ultrasound;Parrafin;Fluidtherapy;Contrast Bath;DME Instruction;Neuromuscular re-education;Gait training;Stair training;Functional mobility training;Therapeutic activities;Therapeutic exercise;Balance training;Patient/family education;Orthotic Fit/Training;Manual lymph drainage;Manual techniques;Compression bandaging;Passive range of motion;Scar mobilization;Dry needling;Energy conservation;Splinting;Taping;Vasopneumatic Device;Vestibular;Spinal Manipulations;Joint Manipulations    PT Next Visit Plan progress R knee rom and strengthening as tolerated, continue manual for pain/mobility and edema reduction PRN. complete prone knee hangs with manual.  Try gentle PROM for extension next session.    PT Home Exercise Plan 6/16 quad set, heel slides 03/22/20: heel prop           Patient will benefit from skilled therapeutic intervention in order to improve the following deficits and impairments:  Abnormal gait, Decreased activity tolerance, Decreased balance, Decreased mobility, Decreased endurance, Decreased range of motion, Decreased knowledge of use of DME, Decreased skin integrity, Decreased strength, Difficulty walking, Increased edema, Increased muscle spasms, Impaired flexibility, Improper body mechanics, Pain  Visit Diagnosis: Right knee pain,  unspecified chronicity  Muscle weakness (generalized)  Stiffness of right knee, not elsewhere classified  Other abnormalities of gait and mobility     Problem List Patient Active Problem List   Diagnosis Date Noted  . Status post total right knee replacement 03/13/2020  . Primary osteoarthritis of right knee 02/10/2020  . S/P right knee arthroscopy 03/03/2019  . Sciatica of left side 11/19/2018  . ANXIETY 07/20/2010  . HEMATOCHEZIA 07/20/2010   Teena Irani, PTA/CLT [phone]  Teena Irani 03/29/2020, 11:46 AM  [place], [place], [address] Phone: [phone]   Fax:  [phone]  Name: MAYETTA CASTLEMAN MRN: [id number] Date of Birth: 06/20/1951

## 2020-03-30 ENCOUNTER — Encounter (HOSPITAL_COMMUNITY): Payer: Medicare HMO

## 2020-03-31 ENCOUNTER — Encounter (HOSPITAL_COMMUNITY): Payer: Medicare HMO | Admitting: Physical Therapy

## 2020-04-04 ENCOUNTER — Telehealth (HOSPITAL_COMMUNITY): Payer: Self-pay | Admitting: Physical Therapy

## 2020-04-04 NOTE — Telephone Encounter (Signed)
She has MD apptmetn and wants to r/s if possible

## 2020-04-05 ENCOUNTER — Encounter (HOSPITAL_COMMUNITY): Payer: Self-pay

## 2020-04-05 ENCOUNTER — Ambulatory Visit (HOSPITAL_COMMUNITY): Payer: Medicare HMO | Attending: Orthopaedic Surgery

## 2020-04-05 ENCOUNTER — Other Ambulatory Visit: Payer: Self-pay

## 2020-04-05 DIAGNOSIS — R2689 Other abnormalities of gait and mobility: Secondary | ICD-10-CM

## 2020-04-05 DIAGNOSIS — M25561 Pain in right knee: Secondary | ICD-10-CM | POA: Diagnosis not present

## 2020-04-05 DIAGNOSIS — M25661 Stiffness of right knee, not elsewhere classified: Secondary | ICD-10-CM

## 2020-04-05 DIAGNOSIS — M6281 Muscle weakness (generalized): Secondary | ICD-10-CM

## 2020-04-05 NOTE — Therapy (Signed)
[place], [place], [address] Phone: [phone]   Fax:  [phone]  Physical Therapy Treatment  Patient Details  Name: Julie Miles MRN: [id number] Date of Birth: October 31, 1950 Referring Provider (PT): Frankey Shown MD   Encounter Date: 04/05/2020   PT End of Session - 04/05/20 1204    Visit Number 8    Number of Visits 18    Date for PT Re-Evaluation 04/26/20    Authorization Type [employer] medicare (no visit limit, [id number]-auth)    Progress Note Due on Visit 10    PT Start Time 1132   4' on bike, not included wiht charges   PT Stop Time 1222    PT Time Calculation (min) 50 min    Activity Tolerance Patient tolerated treatment well    Behavior During Therapy [facility] for tasks assessed/performed           Past Medical History:  Diagnosis Date  . Anxiety   . History of blood transfusion 1975   at [facility] after vag delivery  . History of kidney stones   . Hypercholesterolemia   . Hypothyroidism   . MRSA (methicillin resistant Staphylococcus aureus) 03/08/2020   confirmed by PCR  . PONV (postoperative nausea and vomiting)     Past Surgical History:  Procedure Laterality Date  . ANTERIOR AND POSTERIOR REPAIR  06/24/2012   Procedure: ANTERIOR (CYSTOCELE) AND POSTERIOR REPAIR (RECTOCELE);  Surgeon: Daria Pastures, MD;  Location: [facility];  Service: Gynecology;  Laterality: N/A;  anterior repair  . APPENDECTOMY    . BLADDER SUSPENSION  06/24/2012   Procedure: TRANSVAGINAL TAPE (TVT) PROCEDURE;  Surgeon: Daria Pastures, MD;  Location: [facility];  Service: Gynecology;  Laterality: N/A;  . CHOLECYSTECTOMY    . CYSTOSCOPY  06/24/2012   Procedure: CYSTOSCOPY;  Surgeon: Daria Pastures, MD;  Location: [facility];  Service: Gynecology;  Laterality: N/A;  . FOOT SURGERY Right   . KNEE ARTHROSCOPY W/ MENISCAL REPAIR Right   . LAPAROSCOPY  06/24/2012   Procedure: LAPAROSCOPY OPERATIVE;  Surgeon: Daria Pastures, MD;  Location: [facility];  Service: Gynecology;  Laterality: N/A;  . TOTAL KNEE ARTHROPLASTY Right 03/13/2020   Procedure: RIGHT TOTAL KNEE ARTHROPLASTY;  Surgeon: Leandrew Koyanagi, MD;  Location: [facility];  Service: Orthopedics;  Laterality: Right;  . TUBAL LIGATION    . VAGINAL HYSTERECTOMY  06/24/2012   Procedure: HYSTERECTOMY VAGINAL;  Surgeon: Daria Pastures, MD;  Location: [facility];  Service: Gynecology;  Laterality: N/A;    There were no vitals filed for this visit.   Subjective Assessment - 04/05/20 1144    Subjective Knee is feeling good today, pain scale 2/10.    Patient Stated Goals to improve function and get back to normal    Currently in Pain? Yes    Pain Score 2     Pain Location Knee    Pain Orientation Right    Pain Descriptors / Indicators Sore    Pain Type Surgical pain    Pain Onset 1 to 4 weeks ago    Pain Frequency Constant    Aggravating Factors  bending    Pain Relieving Factors elevation    Effect of Pain on Daily Activities limits                             [facility] Adult PT Treatment/Exercise - 04/05/20 0001  Knee/Hip Exercises: Stretches   Active Hamstring Stretch Right;3 reps;30 seconds    Active Hamstring Stretch Limitations 12" step    Knee: Self-Stretch to increase Flexion Right;5 reps;10 seconds    Knee: Self-Stretch Limitations 12'' step    Gastroc Stretch Both;3 reps;30 seconds    Gastroc Stretch Limitations slant board      Knee/Hip Exercises: Aerobic   Recumbent Bike seat 10 full revolution x 4'      Knee/Hip Exercises: Standing   Heel Raises 20 reps    Terminal Knee Extension Right;10 reps;Theraband    Theraband Level (Terminal Knee Extension) Level 2 (Red)    Terminal Knee Extension Limitations 5" holds      Knee/Hip Exercises: Supine   Short Arc Quad Sets Right;15 reps    Short Arc Quad Sets Limitations 10"    Heel Slides 5 reps    Terminal Knee Extension Limitations Attemted, unable to complete without SLR     Straight Leg Raises 10 reps    Straight Leg Raises Limitations cueing for quad set prior lift    Knee Extension Right;AROM    Knee Extension Limitations 10    Knee Flexion Right;AROM    Knee Flexion Limitations 120    Other Supine Knee/Hip Exercises Gentle PROM for extension with heel prop 3x 20"      Knee/Hip Exercises: Prone   Hamstring Curl 15 reps    Prone Knee Hang 2 minutes    Prone Knee Hang Weights (lbs) with manual     Other Prone Exercises TKE 10x 5" holds 2 sets      Manual Therapy   Manual Therapy Edema management;Soft tissue mobilization    Manual therapy comments All manual therapy completed separately from other skilled interventions    Edema Management decongestive techniques used to decrease edema     Soft tissue mobilization to posterior Rt knee                    PT Short Term Goals - 03/17/20 1350      PT SHORT TERM GOAL #1   Title Patient will be independent with HEP in order to improve functional outcomes.    Time 3    Period Weeks    Status On-going    Target Date 04/05/20      PT SHORT TERM GOAL #2   Title Patient will report at least 25% improvement in symptoms for improved quality of life.    Time 6    Period Weeks    Status On-going    Target Date 04/05/20             PT Long Term Goals - 03/17/20 1350      PT LONG TERM GOAL #1   Title Patient will report at least 75% improvement in symptoms for improved quality of life.    Time 6    Period Weeks    Status On-going      PT LONG TERM GOAL #2   Title Patient will improve FOTO score by at least 10 points in order to indicate improved tolerance to activity.    Time 6    Period Weeks    Status On-going      PT LONG TERM GOAL #3   Title Patient will be able to ambulate for at least 60 minutes with pain no greater than 1/10 in order to demonstrate improved ability to ambulate in the community.    Time 6    Period Weeks  Status On-going      PT LONG TERM GOAL #4   Title  Patient will be able to navigate stairs with reciprocal pattern without compensation in order to demonstrate improved LE strength.    Time 6    Period Weeks    Status On-going                 Plan - 04/05/20 1205    Clinical Impression Statement Session focus with knee mobility primarly extension.  Pt able to make full revolution on bike, seat 10 with cueing to reduce hip compensation.  Manual decognitive massage complete to address edema present proximal knee prior ROM based exercises.  Pt continues to exhibit quad and hamstring noted with increased difficulty (prone hamstring curls and inability to complete supine TKE).  No reports of increased pain through session, was limited by fatigue with activities.  AROM 10-120 degrees.    Personal Factors and Comorbidities Comorbidity 1;Behavior Pattern;Fitness;Transportation;Time since onset of injury/illness/exacerbation    Comorbidities chronic knee pain    Examination-Activity Limitations Bathing;Bed Mobility;Bend;Caring for Others;Carry;Locomotion Level;Sit;Sleep;Squat;Stairs;Stand;Transfers    Examination-Participation Restrictions Church;Cleaning;Volunteer;Driving;Meal Prep;Shop;Yard Work    Stability/Clinical Decision Making Stable/Uncomplicated    Clinical Decision Making Low    Rehab Potential Good    PT Frequency 3x / week    PT Duration 6 weeks    PT Treatment/Interventions ADLs/Self Care Home Management;Aquatic Therapy;Biofeedback;Cryotherapy;Electrical Stimulation;Iontophoresis 4mg /ml Dexamethasone;Traction;Moist Heat;Ultrasound;Parrafin;Fluidtherapy;Contrast Bath;DME Instruction;Neuromuscular re-education;Gait training;Stair training;Functional mobility training;Therapeutic activities;Therapeutic exercise;Balance training;Patient/family education;Orthotic Fit/Training;Manual lymph drainage;Manual techniques;Compression bandaging;Passive range of motion;Scar mobilization;Dry needling;Energy conservation;Splinting;Taping;Vasopneumatic  Device;Vestibular;Spinal Manipulations;Joint Manipulations    PT Next Visit Plan progress R knee rom and strengthening as tolerated, continue manual for pain/mobility and edema reduction PRN. complete prone knee hangs with manual.  Try gentle PROM for extension next session.    PT Home Exercise Plan 6/16 quad set, heel slides 03/22/20: heel prop           Patient will benefit from skilled therapeutic intervention in order to improve the following deficits and impairments:  Abnormal gait, Decreased activity tolerance, Decreased balance, Decreased mobility, Decreased endurance, Decreased range of motion, Decreased knowledge of use of DME, Decreased skin integrity, Decreased strength, Difficulty walking, Increased edema, Increased muscle spasms, Impaired flexibility, Improper body mechanics, Pain  Visit Diagnosis: Other abnormalities of gait and mobility  Muscle weakness (generalized)  Stiffness of right knee, not elsewhere classified  Right knee pain, unspecified chronicity     Problem List Patient Active Problem List   Diagnosis Date Noted  . Status post total right knee replacement 03/13/2020  . Primary osteoarthritis of right knee 02/10/2020  . S/P right knee arthroscopy 03/03/2019  . Sciatica of left side 11/19/2018  . ANXIETY 07/20/2010  . HEMATOCHEZIA 07/20/2010   Ihor Austin, LPTA/CLT; CBIS [phone]  Aldona Lento 04/05/2020, 12:57 PM  [place] [address] [place], [place], [address] Phone: [phone]   Fax:  [phone]  Name: Julie Miles MRN: [id number] Date of Birth: June 18, 1951

## 2020-04-06 ENCOUNTER — Encounter (HOSPITAL_COMMUNITY): Payer: Self-pay | Admitting: Physical Therapy

## 2020-04-06 ENCOUNTER — Ambulatory Visit (HOSPITAL_COMMUNITY): Payer: Medicare HMO | Admitting: Physical Therapy

## 2020-04-06 DIAGNOSIS — M25561 Pain in right knee: Secondary | ICD-10-CM

## 2020-04-06 DIAGNOSIS — M25661 Stiffness of right knee, not elsewhere classified: Secondary | ICD-10-CM

## 2020-04-06 DIAGNOSIS — M6281 Muscle weakness (generalized): Secondary | ICD-10-CM

## 2020-04-06 DIAGNOSIS — R2689 Other abnormalities of gait and mobility: Secondary | ICD-10-CM | POA: Diagnosis not present

## 2020-04-06 NOTE — Therapy (Signed)
[place], [place], [address] Phone: [phone]   Fax:  [phone]  Physical Therapy Treatment  Patient Details  Name: Julie Miles MRN: [id number] Date of Birth: 1951-06-22 Referring Provider (PT): Frankey Shown MD   Encounter Date: 04/06/2020   PT End of Session - 04/06/20 1049    Visit Number 9    Number of Visits 18    Date for PT Re-Evaluation 04/26/20    Authorization Type [employer] medicare (no visit limit, [id number])    Progress Note Due on Visit 10    PT Start Time 1026    PT Stop Time 1108   Time on bike unbilled   PT Time Calculation (min) 42 min    Activity Tolerance Patient tolerated treatment well    Behavior During Therapy [facility] for tasks assessed/performed           Past Medical History:  Diagnosis Date  . Anxiety   . History of blood transfusion 1975   at [facility] after vag delivery  . History of kidney stones   . Hypercholesterolemia   . Hypothyroidism   . MRSA (methicillin resistant Staphylococcus aureus) 03/08/2020   confirmed by PCR  . PONV (postoperative nausea and vomiting)     Past Surgical History:  Procedure Laterality Date  . ANTERIOR AND POSTERIOR REPAIR  06/24/2012   Procedure: ANTERIOR (CYSTOCELE) AND POSTERIOR REPAIR (RECTOCELE);  Surgeon: Daria Pastures, MD;  Location: [facility];  Service: Gynecology;  Laterality: N/A;  anterior repair  . APPENDECTOMY    . BLADDER SUSPENSION  06/24/2012   Procedure: TRANSVAGINAL TAPE (TVT) PROCEDURE;  Surgeon: Daria Pastures, MD;  Location: [facility];  Service: Gynecology;  Laterality: N/A;  . CHOLECYSTECTOMY    . CYSTOSCOPY  06/24/2012   Procedure: CYSTOSCOPY;  Surgeon: Daria Pastures, MD;  Location: [facility];  Service: Gynecology;  Laterality: N/A;  . FOOT SURGERY Right   . KNEE ARTHROSCOPY W/ MENISCAL REPAIR Right   . LAPAROSCOPY  06/24/2012   Procedure: LAPAROSCOPY OPERATIVE;  Surgeon: Daria Pastures, MD;   Location: [facility];  Service: Gynecology;  Laterality: N/A;  . TOTAL KNEE ARTHROPLASTY Right 03/13/2020   Procedure: RIGHT TOTAL KNEE ARTHROPLASTY;  Surgeon: Leandrew Koyanagi, MD;  Location: [facility];  Service: Orthopedics;  Laterality: Right;  . TUBAL LIGATION    . VAGINAL HYSTERECTOMY  06/24/2012   Procedure: HYSTERECTOMY VAGINAL;  Surgeon: Daria Pastures, MD;  Location: [facility];  Service: Gynecology;  Laterality: N/A;    There were no vitals filed for this visit.   Subjective Assessment - 04/06/20 1028    Subjective Patient reported that her knee pain is a 2/10 currently.    Patient Stated Goals to improve function and get back to normal    Currently in Pain? Yes    Pain Score 2     Pain Location Knee    Pain Orientation Right    Pain Descriptors / Indicators Sore    Pain Onset 1 to 4 weeks ago                             [facility] Adult PT Treatment/Exercise - 04/06/20 0001      Knee/Hip Exercises: Stretches   Active Hamstring Stretch Right;3 reps;30 seconds    Active Hamstring Stretch Limitations 12" step    Knee: Self-Stretch to increase Flexion Right;10 seconds;Other (comment)   10x  Knee: Self-Stretch Limitations 12'' step    Gastroc Stretch Both;3 reps;30 seconds    Gastroc Stretch Limitations slant board      Knee/Hip Exercises: Aerobic   Recumbent Bike seat 10 full revolution x 4'      Knee/Hip Exercises: Standing   Heel Raises 20 reps    Terminal Knee Extension Right;10 reps;Theraband    Theraband Level (Terminal Knee Extension) Level 2 (Red)    Terminal Knee Extension Limitations 10'' holds      Knee/Hip Exercises: Supine   Quad Sets Right;15 reps    [employer] Limitations 5'' holds    Short Arc [employer] Right;15 reps    Short Arc Quad Sets Limitations 10''. Knee over foam roll    Straight Leg Raises 10 reps    Straight Leg Raises Limitations cueing for quad set prior lift    Knee Extension Right;AROM    Knee Extension Limitations 9    Knee  Flexion Right;AROM    Knee Flexion Limitations 121      Knee/Hip Exercises: Prone   Hamstring Curl 15 reps    Prone Knee Hang 3 minutes    Prone Knee Hang Weights (lbs) with manual       Manual Therapy   Manual Therapy Edema management;Soft tissue mobilization    Manual therapy comments All manual therapy completed separately from other skilled interventions    Edema Management Retrograde massage to right LE to reduce edema    Soft tissue mobilization to posterior Rt knee                    PT Short Term Goals - 03/17/20 1350      PT SHORT TERM GOAL #1   Title Patient will be independent with HEP in order to improve functional outcomes.    Time 3    Period Weeks    Status On-going    Target Date 04/05/20      PT SHORT TERM GOAL #2   Title Patient will report at least 25% improvement in symptoms for improved quality of life.    Time 6    Period Weeks    Status On-going    Target Date 04/05/20             PT Long Term Goals - 03/17/20 1350      PT LONG TERM GOAL #1   Title Patient will report at least 75% improvement in symptoms for improved quality of life.    Time 6    Period Weeks    Status On-going      PT LONG TERM GOAL #2   Title Patient will improve FOTO score by at least 10 points in order to indicate improved tolerance to activity.    Time 6    Period Weeks    Status On-going      PT LONG TERM GOAL #3   Title Patient will be able to ambulate for at least 60 minutes with pain no greater than 1/10 in order to demonstrate improved ability to ambulate in the community.    Time 6    Period Weeks    Status On-going      PT LONG TERM GOAL #4   Title Patient will be able to navigate stairs with reciprocal pattern without compensation in order to demonstrate improved LE strength.    Time 6    Period Weeks    Status On-going  Plan - 04/06/20 1110    Clinical Impression Statement Continued to focus on ROM exercises this  session particularly Right knee extension ROM. Increased prone knee hang time to 3 minutes and educated patient to perform this at home for a stretch to the posterior knee. This session patient's AROM ranged from 9-121 degrees. Patient would benefit from continued skilled physical therapy to continue progressing towards functional goals.    Personal Factors and Comorbidities Comorbidity 1;Behavior Pattern;Fitness;Transportation;Time since onset of injury/illness/exacerbation    Comorbidities chronic knee pain    Examination-Activity Limitations Bathing;Bed Mobility;Bend;Caring for Others;Carry;Locomotion Level;Sit;Sleep;Squat;Stairs;Stand;Transfers    Examination-Participation Restrictions Church;Cleaning;Volunteer;Driving;Meal Prep;Shop;Yard Work    Stability/Clinical Decision Making Stable/Uncomplicated    Rehab Potential Good    PT Frequency 3x / week    PT Duration 6 weeks    PT Treatment/Interventions ADLs/Self Care Home Management;Aquatic Therapy;Biofeedback;Cryotherapy;Electrical Stimulation;Iontophoresis 4mg /ml Dexamethasone;Traction;Moist Heat;Ultrasound;Parrafin;Fluidtherapy;Contrast Bath;DME Instruction;Neuromuscular re-education;Gait training;Stair training;Functional mobility training;Therapeutic activities;Therapeutic exercise;Balance training;Patient/family education;Orthotic Fit/Training;Manual lymph drainage;Manual techniques;Compression bandaging;Passive range of motion;Scar mobilization;Dry needling;Energy conservation;Splinting;Taping;Vasopneumatic Device;Vestibular;Spinal Manipulations;Joint Manipulations    PT Next Visit Plan progress R knee rom and strengthening as tolerated, continue manual for pain/mobility and edema reduction PRN. complete prone knee hangs with manual.  Try gentle PROM for extension next session.    PT Home Exercise Plan 6/16 quad set, heel slides 03/22/20: heel prop; 04/06/20: Prone knee hang 2-3 minutes           Patient will benefit from skilled therapeutic  intervention in order to improve the following deficits and impairments:  Abnormal gait, Decreased activity tolerance, Decreased balance, Decreased mobility, Decreased endurance, Decreased range of motion, Decreased knowledge of use of DME, Decreased skin integrity, Decreased strength, Difficulty walking, Increased edema, Increased muscle spasms, Impaired flexibility, Improper body mechanics, Pain  Visit Diagnosis: Right knee pain, unspecified chronicity  Other abnormalities of gait and mobility  Muscle weakness (generalized)  Stiffness of right knee, not elsewhere classified     Problem List Patient Active Problem List   Diagnosis Date Noted  . Status post total right knee replacement 03/13/2020  . Primary osteoarthritis of right knee 02/10/2020  . S/P right knee arthroscopy 03/03/2019  . Sciatica of left side 11/19/2018  . ANXIETY 07/20/2010  . HEMATOCHEZIA 07/20/2010   Clarene Critchley PT, DPT 11:13 AM, 04/06/20 [place], [place], [address] Phone: [phone]   Fax:  [phone]  Name: FARTUN PARADISO MRN: [id number] Date of Birth: [birth year]

## 2020-04-07 ENCOUNTER — Encounter (HOSPITAL_COMMUNITY): Payer: Medicare HMO | Admitting: Physical Therapy

## 2020-04-10 ENCOUNTER — Other Ambulatory Visit: Payer: Self-pay

## 2020-04-10 ENCOUNTER — Ambulatory Visit (HOSPITAL_COMMUNITY): Payer: Medicare HMO

## 2020-04-10 ENCOUNTER — Encounter (HOSPITAL_COMMUNITY): Payer: Self-pay

## 2020-04-10 DIAGNOSIS — R2689 Other abnormalities of gait and mobility: Secondary | ICD-10-CM

## 2020-04-10 DIAGNOSIS — M25661 Stiffness of right knee, not elsewhere classified: Secondary | ICD-10-CM

## 2020-04-10 DIAGNOSIS — M6281 Muscle weakness (generalized): Secondary | ICD-10-CM | POA: Diagnosis not present

## 2020-04-10 DIAGNOSIS — M25561 Pain in right knee: Secondary | ICD-10-CM | POA: Diagnosis not present

## 2020-04-10 NOTE — Therapy (Addendum)
[place], [place], [address] Phone: [phone]   Fax:  [phone]   Progress Note Reporting Period 03/15/20 to 04/10/20  See note below for Objective Data and Assessment of Progress/Goals.         Physical Therapy Treatment  Patient Details  Name: Julie Miles MRN: [id number] Date of Birth: [birth year] Referring Provider (PT): Frankey Shown MD   Encounter Date: 04/10/2020   PT End of Session - 04/10/20 1407    Visit Number 10    Number of Visits 18    Date for PT Re-Evaluation 04/26/20    Authorization Type [employer] medicare (no visit limit, [id number]-auth)    Progress Note Due on Visit 20    PT Start Time 0160    PT Stop Time 1432    PT Time Calculation (min) 43 min    Activity Tolerance Patient tolerated treatment well;No increased pain    Behavior During Therapy WFL for tasks assessed/performed           Past Medical History:  Diagnosis Date  . Anxiety   . History of blood transfusion 1975   at [facility] after vag delivery  . History of kidney stones   . Hypercholesterolemia   . Hypothyroidism   . MRSA (methicillin resistant Staphylococcus aureus) 03/08/2020   confirmed by PCR  . PONV (postoperative nausea and vomiting)     Past Surgical History:  Procedure Laterality Date  . ANTERIOR AND POSTERIOR REPAIR  06/24/2012   Procedure: ANTERIOR (CYSTOCELE) AND POSTERIOR REPAIR (RECTOCELE);  Surgeon: Daria Pastures, MD;  Location: [facility];  Service: Gynecology;  Laterality: N/A;  anterior repair  . APPENDECTOMY    . BLADDER SUSPENSION  06/24/2012   Procedure: TRANSVAGINAL TAPE (TVT) PROCEDURE;  Surgeon: Daria Pastures, MD;  Location: [facility];  Service: Gynecology;  Laterality: N/A;  . CHOLECYSTECTOMY    . CYSTOSCOPY  06/24/2012   Procedure: CYSTOSCOPY;  Surgeon: Daria Pastures, MD;  Location: [facility];  Service: Gynecology;  Laterality: N/A;  . FOOT SURGERY Right   . KNEE  ARTHROSCOPY W/ MENISCAL REPAIR Right   . LAPAROSCOPY  06/24/2012   Procedure: LAPAROSCOPY OPERATIVE;  Surgeon: Daria Pastures, MD;  Location: [facility];  Service: Gynecology;  Laterality: N/A;  . TOTAL KNEE ARTHROPLASTY Right 03/13/2020   Procedure: RIGHT TOTAL KNEE ARTHROPLASTY;  Surgeon: Leandrew Koyanagi, MD;  Location: [facility];  Service: Orthopedics;  Laterality: Right;  . TUBAL LIGATION    . VAGINAL HYSTERECTOMY  06/24/2012   Procedure: HYSTERECTOMY VAGINAL;  Surgeon: Daria Pastures, MD;  Location: [facility];  Service: Gynecology;  Laterality: N/A;    There were no vitals filed for this visit.   Subjective Assessment - 04/10/20 1351    Subjective Pt reports 2/10 pain. Pt reports a numb spot on lateral knee. Pt reports "erksome" sensation in R knee when extending knee from bent position.    Patient Stated Goals to improve function and get back to normal    Currently in Pain? Yes    Pain Score 2     Pain Location Knee    Pain Orientation Right    Pain Descriptors / Indicators Sore    Pain Onset 1 to 4 weeks ago    Aggravating Factors  bending    Pain Relieving Factors elevation    Effect of Pain on Daily Activities limits  [facility] PT Assessment - 04/10/20 0001      Assessment   Medical Diagnosis R TKA    Referring Provider (PT) Frankey Shown MD    Onset Date/Surgical Date 03/13/20    Next MD Visit 03/28/20    Prior Therapy Yes for meniscus      Cognition   Overall Cognitive Status Within Functional Limits for tasks assessed      Observation/Other Assessments   Observations ambulates without AD, decreased R knee flexion/extension throughout gait cycle    Focus on Therapeutic Outcomes (FOTO)  41% limited   was 61% limited     Sensation   Light Touch Impaired Detail    Additional Comments lateral joint line decreased sensation to light touch      AROM   AROM Assessment Site Knee    Right/Left Knee Right;Left    Right Knee Extension 15   was 14 from extension    Right Knee Flexion 125   was 103     Strength   Strength Assessment Site Hip;Knee;Ankle    Right Hip Flexion 4+/5   was 3   Left Hip Flexion 4+/5   was 4+   Right Knee Flexion --   was 4   Right Knee Extension 5/5   was 3   Left Knee Flexion --   was 5   Left Knee Extension 5/5   was 5   Right Ankle Dorsiflexion 5/5   was 4+   Left Ankle Dorsiflexion 5/5   was 5     Palpation   Palpation comment TTP over knee joint; denies TTP at calf, quad, hamstrings, medial/lateral knee                [facility] Adult PT Treatment/Exercise - 04/10/20 0001      Ambulation/Gait   Ambulation/Gait Yes    Ambulation/Gait Assistance 6: Modified independent (Device/Increase time)    Ambulation Distance (Feet) 226 Feet    Assistive device None    Gait Pattern Decreased hip/knee flexion - right    Gait Comments initial cues for heel strike, equal bil step length and weight shifting, no AD and no loss of balance      Knee/Hip Exercises: Stretches   Active Hamstring Stretch Right;3 reps;30 seconds    Active Hamstring Stretch Limitations 12" step    Gastroc Stretch Both;3 reps;30 seconds    Gastroc Stretch Limitations slant board      Knee/Hip Exercises: Standing   Terminal Knee Extension Right;10 reps;Theraband    Theraband Level (Terminal Knee Extension) Level 3 (Green)    Terminal Knee Extension Limitations 5" hold      Manual Therapy   Manual Therapy Soft tissue mobilization    Manual therapy comments All manual therapy completed separately from other skilled interventions    Soft tissue mobilization to posterior Rt knee/hamstrings                  PT Education - 04/10/20 1407    Education Details Continue HEP, exercise technique, reviewed progress note findings    Person(s) Educated Patient    Methods Explanation;Demonstration    Comprehension Verbalized understanding;Returned demonstration            PT Short Term Goals - 04/10/20 1407      PT SHORT TERM GOAL #1   Title  Patient will be independent with HEP in order to improve functional outcomes.    Baseline 7/12: pt reports daily compliance    Time 3  Period Weeks    Status Achieved    Target Date 04/05/20      PT SHORT TERM GOAL #2   Title Patient will report at least 25% improvement in symptoms for improved quality of life.    Baseline 7/12: pt reports 65% improvement    Time 6    Period Weeks    Status Achieved    Target Date 04/05/20             PT Long Term Goals - 03/17/20 1350      PT LONG TERM GOAL #1   Title Patient will report at least 75% improvement in symptoms for improved quality of life.    Time 6    Period Weeks    Status On-going      PT LONG TERM GOAL #2   Title Patient will improve FOTO score by at least 10 points in order to indicate improved tolerance to activity.    Time 6    Period Weeks    Status On-going      PT LONG TERM GOAL #3   Title Patient will be able to ambulate for at least 60 minutes with pain no greater than 1/10 in order to demonstrate improved ability to ambulate in the community.    Time 6    Period Weeks    Status On-going      PT LONG TERM GOAL #4   Title Patient will be able to navigate stairs with reciprocal pattern without compensation in order to demonstrate improved LE strength.    Time 6    Period Weeks    Status On-going                 Plan - 04/10/20 1434    Clinical Impression Statement Progress note completed this session. Pt with improvement in R knee AROM and strength. Pt ambulating without AD, continues to be limited in AROM throughout gait cycle in knee, but equal step length and good weight-shifting noted without loss of balance or increase pain. Pt continues to have deficits, especially in extension, but able to verbalize appropriate exercises at home. Pt with improving activity tolerance to household chores and able to navigate 6 steps at sisters home with step to pattern; pt demonstrates good step to and reciprocal  gait pattern on steps with single HHA in clinic. Pt would continue to benefit from skilled PT interventions to improve deficits noted and return to PLOF.    Personal Factors and Comorbidities Comorbidity 1;Behavior Pattern;Fitness;Transportation;Time since onset of injury/illness/exacerbation    Comorbidities chronic knee pain    Examination-Activity Limitations Bathing;Bed Mobility;Bend;Caring for Others;Carry;Locomotion Level;Sit;Sleep;Squat;Stairs;Stand;Transfers    Examination-Participation Restrictions Church;Cleaning;Volunteer;Driving;Meal Prep;Shop;Yard Work    Stability/Clinical Decision Making Stable/Uncomplicated    Rehab Potential Good    PT Frequency 3x / week    PT Duration 6 weeks    PT Treatment/Interventions ADLs/Self Care Home Management;Aquatic Therapy;Biofeedback;Cryotherapy;Electrical Stimulation;Iontophoresis 4mg /ml Dexamethasone;Traction;Moist Heat;Ultrasound;Parrafin;Fluidtherapy;Contrast Bath;DME Instruction;Neuromuscular re-education;Gait training;Stair training;Functional mobility training;Therapeutic activities;Therapeutic exercise;Balance training;Patient/family education;Orthotic Fit/Training;Manual lymph drainage;Manual techniques;Compression bandaging;Passive range of motion;Scar mobilization;Dry needling;Energy conservation;Splinting;Taping;Vasopneumatic Device;Vestibular;Spinal Manipulations;Joint Manipulations    PT Next Visit Plan progress R knee ROM and strengthening, continue manual for pain/mobility and edema reduction PRN. complete prone knee hangs with manual and try gentle PROM for extension next session.    PT Home Exercise Plan 6/16 quad set, heel slides 03/22/20: heel prop; 04/06/20: Prone knee hang 2-3 minutes    Consulted and Agree with Plan of Care Patient  Patient will benefit from skilled therapeutic intervention in order to improve the following deficits and impairments:  Abnormal gait, Decreased activity tolerance, Decreased balance,  Decreased mobility, Decreased endurance, Decreased range of motion, Decreased knowledge of use of DME, Decreased skin integrity, Decreased strength, Difficulty walking, Increased edema, Increased muscle spasms, Impaired flexibility, Improper body mechanics, Pain  Visit Diagnosis: Right knee pain, unspecified chronicity  Other abnormalities of gait and mobility  Muscle weakness (generalized)  Stiffness of right knee, not elsewhere classified     Problem List Patient Active Problem List   Diagnosis Date Noted  . Status post total right knee replacement 03/13/2020  . Primary osteoarthritis of right knee 02/10/2020  . S/P right knee arthroscopy 03/03/2019  . Sciatica of left side 11/19/2018  . ANXIETY 07/20/2010  . HEMATOCHEZIA 07/20/2010     Talbot Grumbling PT, DPT 04/10/20, 2:41 PM [place], [place], [address] Phone: [phone]   Fax:  [phone]  Name: Julie Miles MRN: [id number] Date of Birth: [birth year]

## 2020-04-12 ENCOUNTER — Telehealth (HOSPITAL_COMMUNITY): Payer: Self-pay

## 2020-04-12 ENCOUNTER — Encounter (HOSPITAL_COMMUNITY): Payer: Medicare HMO

## 2020-04-12 NOTE — Telephone Encounter (Signed)
pt cancelled appt for today because she is sick on the stomach

## 2020-04-14 ENCOUNTER — Ambulatory Visit (HOSPITAL_COMMUNITY): Payer: Medicare HMO

## 2020-04-14 ENCOUNTER — Encounter (HOSPITAL_COMMUNITY): Payer: Self-pay

## 2020-04-14 ENCOUNTER — Other Ambulatory Visit: Payer: Self-pay

## 2020-04-14 DIAGNOSIS — M6281 Muscle weakness (generalized): Secondary | ICD-10-CM

## 2020-04-14 DIAGNOSIS — M25561 Pain in right knee: Secondary | ICD-10-CM

## 2020-04-14 DIAGNOSIS — R2689 Other abnormalities of gait and mobility: Secondary | ICD-10-CM | POA: Diagnosis not present

## 2020-04-14 DIAGNOSIS — M25661 Stiffness of right knee, not elsewhere classified: Secondary | ICD-10-CM | POA: Diagnosis not present

## 2020-04-14 NOTE — Therapy (Signed)
[place], [place], [address] Phone: [phone]   Fax:  [phone]  Physical Therapy Treatment  Patient Details  Name: Julie Miles MRN: [id number] Date of Birth: 18-Oct-1950 Referring Provider (PT): Frankey Shown MD   Encounter Date: 04/14/2020   PT End of Session - 04/14/20 1138    Visit Number 11    Number of Visits 18    Date for PT Re-Evaluation 04/26/20    Authorization Type [employer] medicare (no visit limit, [id number])    Progress Note Due on Visit 20    PT Start Time 1125    PT Stop Time 1210    PT Time Calculation (min) 45 min    Activity Tolerance Patient tolerated treatment well;No increased pain    Behavior During Therapy WFL for tasks assessed/performed           Past Medical History:  Diagnosis Date  . Anxiety   . History of blood transfusion 1975   at [facility] after vag delivery  . History of kidney stones   . Hypercholesterolemia   . Hypothyroidism   . MRSA (methicillin resistant Staphylococcus aureus) 03/08/2020   confirmed by PCR  . PONV (postoperative nausea and vomiting)     Past Surgical History:  Procedure Laterality Date  . ANTERIOR AND POSTERIOR REPAIR  06/24/2012   Procedure: ANTERIOR (CYSTOCELE) AND POSTERIOR REPAIR (RECTOCELE);  Surgeon: Daria Pastures, MD;  Location: [facility];  Service: Gynecology;  Laterality: N/A;  anterior repair  . APPENDECTOMY    . BLADDER SUSPENSION  06/24/2012   Procedure: TRANSVAGINAL TAPE (TVT) PROCEDURE;  Surgeon: Daria Pastures, MD;  Location: [facility];  Service: Gynecology;  Laterality: N/A;  . CHOLECYSTECTOMY    . CYSTOSCOPY  06/24/2012   Procedure: CYSTOSCOPY;  Surgeon: Daria Pastures, MD;  Location: [facility];  Service: Gynecology;  Laterality: N/A;  . FOOT SURGERY Right   . KNEE ARTHROSCOPY W/ MENISCAL REPAIR Right   . LAPAROSCOPY  06/24/2012   Procedure: LAPAROSCOPY OPERATIVE;  Surgeon: Daria Pastures, MD;  Location:  [facility];  Service: Gynecology;  Laterality: N/A;  . TOTAL KNEE ARTHROPLASTY Right 03/13/2020   Procedure: RIGHT TOTAL KNEE ARTHROPLASTY;  Surgeon: Leandrew Koyanagi, MD;  Location: [facility];  Service: Orthopedics;  Laterality: Right;  . TUBAL LIGATION    . VAGINAL HYSTERECTOMY  06/24/2012   Procedure: HYSTERECTOMY VAGINAL;  Surgeon: Daria Pastures, MD;  Location: [facility];  Service: Gynecology;  Laterality: N/A;    There were no vitals filed for this visit.   Subjective Assessment - 04/14/20 1137    Subjective Knee is feeling good today, no reports of pain.  Pt stated she continues to have a click in back of knee.    Patient Stated Goals to improve function and get back to normal    Currently in Pain? No/denies                             [facility] Adult PT Treatment/Exercise - 04/14/20 0001      Knee/Hip Exercises: Stretches   Active Hamstring Stretch Right;3 reps;30 seconds    Active Hamstring Stretch Limitations 12" step    Gastroc Stretch Both;3 reps;30 seconds    Gastroc Stretch Limitations slant board      Knee/Hip Exercises: Standing   Heel Raises Limitations toe raises on slope    Terminal Knee Extension Right;15 reps  Theraband Level (Terminal Knee Extension) Other (comment)    Terminal Knee Extension Limitations 5" hold; purple theraband      Knee/Hip Exercises: Supine   Short Arc Quad Sets Right;15 reps    Short Arc Quad Sets Limitations 10" holds    Heel Slides 5 reps    Knee Extension Right;AROM    Knee Extension Limitations 12      Knee/Hip Exercises: Prone   Prone Knee Hang Weights;Limitations    Prone Knee Hang Weights (lbs) 3    Prone Knee Hang Limitations with manual to hamstrings, 8 minute duraction    Other Prone Exercises TKE 10x 5" holds 2 sets    Other Prone Exercises contract relax during prone knee hang for extensin 5x 10" holds      Manual Therapy   Manual Therapy Soft tissue mobilization;Passive ROM    Manual therapy comments All  manual therapy completed separately from other skilled interventions    Soft tissue mobilization to posterior Rt knee/hamstrings    Passive ROM contract relax in prone for extensionduring prone knee hang 5 reps with 10"  upon relax; supine PROM wiht heel proped                    PT Short Term Goals - 04/10/20 1407      PT SHORT TERM GOAL #1   Title Patient will be independent with HEP in order to improve functional outcomes.    Baseline 7/12: pt reports daily compliance    Time 3    Period Weeks    Status Achieved    Target Date 04/05/20      PT SHORT TERM GOAL #2   Title Patient will report at least 25% improvement in symptoms for improved quality of life.    Baseline 7/12: pt reports 65% improvement    Time 6    Period Weeks    Status Achieved    Target Date 04/05/20             PT Long Term Goals - 03/17/20 1350      PT LONG TERM GOAL #1   Title Patient will report at least 75% improvement in symptoms for improved quality of life.    Time 6    Period Weeks    Status On-going      PT LONG TERM GOAL #2   Title Patient will improve FOTO score by at least 10 points in order to indicate improved tolerance to activity.    Time 6    Period Weeks    Status On-going      PT LONG TERM GOAL #3   Title Patient will be able to ambulate for at least 60 minutes with pain no greater than 1/10 in order to demonstrate improved ability to ambulate in the community.    Time 6    Period Weeks    Status On-going      PT LONG TERM GOAL #4   Title Patient will be able to navigate stairs with reciprocal pattern without compensation in order to demonstrate improved LE strength.    Time 6    Period Weeks    Status On-going                 Plan - 04/14/20 1214    Clinical Impression Statement Added contract/relax and gentle PROM to address extension lag.  Pt tolerated well with no reports of pain.  AROM 12-125 degrees.  Pt adviced to complete 10 quad  sets per hour.     Personal Factors and Comorbidities Comorbidity 1;Behavior Pattern;Fitness;Transportation;Time since onset of injury/illness/exacerbation    Comorbidities chronic knee pain    Examination-Activity Limitations Bathing;Bed Mobility;Bend;Caring for Others;Carry;Locomotion Level;Sit;Sleep;Squat;Stairs;Stand;Transfers    Examination-Participation Restrictions Church;Cleaning;Volunteer;Driving;Meal Prep;Shop;Yard Work    Stability/Clinical Decision Making Stable/Uncomplicated    Clinical Decision Making Low    Rehab Potential Good    PT Frequency 3x / week    PT Duration 6 weeks    PT Treatment/Interventions ADLs/Self Care Home Management;Aquatic Therapy;Biofeedback;Cryotherapy;Electrical Stimulation;Iontophoresis 4mg /ml Dexamethasone;Traction;Moist Heat;Ultrasound;Parrafin;Fluidtherapy;Contrast Bath;DME Instruction;Neuromuscular re-education;Gait training;Stair training;Functional mobility training;Therapeutic activities;Therapeutic exercise;Balance training;Patient/family education;Orthotic Fit/Training;Manual lymph drainage;Manual techniques;Compression bandaging;Passive range of motion;Scar mobilization;Dry needling;Energy conservation;Splinting;Taping;Vasopneumatic Device;Vestibular;Spinal Manipulations;Joint Manipulations    PT Next Visit Plan progress R knee ROM and strengthening, continue manual for pain/mobility and edema reduction PRN. complete prone knee hangs with manual and try gentle PROM for extension next session.    PT Home Exercise Plan 6/16 quad set, heel slides 03/22/20: heel prop; 04/06/20: Prone knee hang 2-3 minutes           Patient will benefit from skilled therapeutic intervention in order to improve the following deficits and impairments:  Abnormal gait, Decreased activity tolerance, Decreased balance, Decreased mobility, Decreased endurance, Decreased range of motion, Decreased knowledge of use of DME, Decreased skin integrity, Decreased strength, Difficulty walking, Increased  edema, Increased muscle spasms, Impaired flexibility, Improper body mechanics, Pain  Visit Diagnosis: Muscle weakness (generalized)  Stiffness of right knee, not elsewhere classified  Other abnormalities of gait and mobility  Right knee pain, unspecified chronicity     Problem List Patient Active Problem List   Diagnosis Date Noted  . Status post total right knee replacement 03/13/2020  . Primary osteoarthritis of right knee 02/10/2020  . S/P right knee arthroscopy 03/03/2019  . Sciatica of left side 11/19/2018  . ANXIETY 07/20/2010  . HEMATOCHEZIA 07/20/2010   Ihor Austin, LPTA/CLT; CBIS [phone]  Aldona Lento 04/14/2020, 12:19 PM  [place], [place], [address] Phone: [phone]   Fax:  [phone]  Name: Julie Miles MRN: [id number] Date of Birth: 07/20/1951

## 2020-04-17 ENCOUNTER — Ambulatory Visit (HOSPITAL_COMMUNITY): Payer: Medicare HMO | Admitting: Physical Therapy

## 2020-04-17 ENCOUNTER — Encounter (HOSPITAL_COMMUNITY): Payer: Self-pay | Admitting: Physical Therapy

## 2020-04-17 ENCOUNTER — Other Ambulatory Visit: Payer: Self-pay

## 2020-04-17 DIAGNOSIS — M25561 Pain in right knee: Secondary | ICD-10-CM | POA: Diagnosis not present

## 2020-04-17 DIAGNOSIS — M25661 Stiffness of right knee, not elsewhere classified: Secondary | ICD-10-CM

## 2020-04-17 DIAGNOSIS — M6281 Muscle weakness (generalized): Secondary | ICD-10-CM

## 2020-04-17 DIAGNOSIS — R2689 Other abnormalities of gait and mobility: Secondary | ICD-10-CM | POA: Diagnosis not present

## 2020-04-17 NOTE — Patient Instructions (Signed)
Access Code: [email or web address] URL: [email or web address] Date: 04/17/2020 Prepared by: Mitzi Hansen Renuka Farfan  Exercises Supine Isometric Hamstring Set - 2 x daily - 7 x weekly - 10 reps - 10 second hold Supine Knee Extension Mobilization with Weight - 2 x daily - 7 x weekly - 10-15 minutes hold

## 2020-04-17 NOTE — Therapy (Signed)
[place], [place], [address] Phone: [phone]   Fax:  [phone]  Physical Therapy Treatment  Patient Details  Name: Julie Miles MRN: [id number] Date of Birth: [birth year] Referring Provider (PT): Frankey Shown MD   Encounter Date: 04/17/2020   PT End of Session - 04/17/20 1351    Visit Number 12    Number of Visits 18    Date for PT Re-Evaluation 04/26/20    Authorization Type [employer] medicare (no visit limit, [id number]-auth)    Progress Note Due on Visit 20    PT Start Time 1347    PT Stop Time 1428    PT Time Calculation (min) 41 min    Activity Tolerance Patient tolerated treatment well;No increased pain    Behavior During Therapy WFL for tasks assessed/performed           Past Medical History:  Diagnosis Date  . Anxiety   . History of blood transfusion 1975   at [facility] after vag delivery  . History of kidney stones   . Hypercholesterolemia   . Hypothyroidism   . MRSA (methicillin resistant Staphylococcus aureus) 03/08/2020   confirmed by PCR  . PONV (postoperative nausea and vomiting)     Past Surgical History:  Procedure Laterality Date  . ANTERIOR AND POSTERIOR REPAIR  06/24/2012   Procedure: ANTERIOR (CYSTOCELE) AND POSTERIOR REPAIR (RECTOCELE);  Surgeon: Daria Pastures, MD;  Location: [facility];  Service: Gynecology;  Laterality: N/A;  anterior repair  . APPENDECTOMY    . BLADDER SUSPENSION  06/24/2012   Procedure: TRANSVAGINAL TAPE (TVT) PROCEDURE;  Surgeon: Daria Pastures, MD;  Location: [facility];  Service: Gynecology;  Laterality: N/A;  . CHOLECYSTECTOMY    . CYSTOSCOPY  06/24/2012   Procedure: CYSTOSCOPY;  Surgeon: Daria Pastures, MD;  Location: [facility];  Service: Gynecology;  Laterality: N/A;  . FOOT SURGERY Right   . KNEE ARTHROSCOPY W/ MENISCAL REPAIR Right   . LAPAROSCOPY  06/24/2012   Procedure: LAPAROSCOPY OPERATIVE;  Surgeon: Daria Pastures, MD;  Location:  [facility];  Service: Gynecology;  Laterality: N/A;  . TOTAL KNEE ARTHROPLASTY Right 03/13/2020   Procedure: RIGHT TOTAL KNEE ARTHROPLASTY;  Surgeon: Leandrew Koyanagi, MD;  Location: [facility];  Service: Orthopedics;  Laterality: Right;  . TUBAL LIGATION    . VAGINAL HYSTERECTOMY  06/24/2012   Procedure: HYSTERECTOMY VAGINAL;  Surgeon: Daria Pastures, MD;  Location: [facility];  Service: Gynecology;  Laterality: N/A;    There were no vitals filed for this visit.   Subjective Assessment - 04/17/20 1346    Subjective Patient states everything has been going so so. Most difficulty with straightening up her knee. She doesn't feel she is very limited with ADL. She has not been getting in the tub.    Patient Stated Goals to improve function and get back to normal    Currently in Pain? Yes    Pain Score 2                              [facility] Adult PT Treatment/Exercise - 04/17/20 0001      Knee/Hip Exercises: Stretches   Active Hamstring Stretch Right;3 reps;30 seconds    Active Hamstring Stretch Limitations 12" step    Gastroc Stretch Both;3 reps;30 seconds    Gastroc Stretch Limitations slant board      Knee/Hip Exercises: Standing   Terminal  Knee Extension Right;15 reps    Theraband Level (Terminal Knee Extension) Other (comment)    Terminal Knee Extension Limitations 5" hold; purple theraband    Lateral Step Up 2 sets;10 reps;Hand Hold: 2;Step Height: 4"    Lateral Step Up Limitations eccentric control      Knee/Hip Exercises: Seated   Other Seated Knee/Hip Exercises R hamstring isometric 10x 10 second holds with ball      Knee/Hip Exercises: Supine   Knee Extension Right;AROM    Knee Extension Limitations 12 (improves to lacking 10 followin manual therapy    Knee Flexion AROM    Knee Flexion Limitations 121      Manual Therapy   Manual Therapy Joint mobilization    Manual therapy comments All manual therapy completed separately from other skilled interventions    Joint  Mobilization R Tibial AP with ER with femur IR/stabilzation    Soft tissue mobilization to posterior Rt knee/hamstrings                  PT Education - 04/17/20 1351    Education Details educated on HEP, mechanics of exercise, LE positioning for improving extension ROM    Person(s) Educated Patient    Methods Explanation;Demonstration;Handout    Comprehension Verbalized understanding;Returned demonstration            PT Short Term Goals - 04/10/20 1407      PT SHORT TERM GOAL #1   Title Patient will be independent with HEP in order to improve functional outcomes.    Baseline 7/12: pt reports daily compliance    Time 3    Period Weeks    Status Achieved    Target Date 04/05/20      PT SHORT TERM GOAL #2   Title Patient will report at least 25% improvement in symptoms for improved quality of life.    Baseline 7/12: pt reports 65% improvement    Time 6    Period Weeks    Status Achieved    Target Date 04/05/20             PT Long Term Goals - 04/17/20 1435      PT LONG TERM GOAL #1   Title Patient will report at least 75% improvement in symptoms for improved quality of life.    Baseline 7/19 80%    Time 6    Period Weeks    Status Achieved      PT LONG TERM GOAL #2   Title Patient will improve FOTO score by at least 10 points in order to indicate improved tolerance to activity.    Time 6    Period Weeks    Status On-going      PT LONG TERM GOAL #3   Title Patient will be able to ambulate for at least 60 minutes with pain no greater than 1/10 in order to demonstrate improved ability to ambulate in the community.    Time 6    Period Weeks    Status On-going      PT LONG TERM GOAL #4   Title Patient will be able to navigate stairs with reciprocal pattern without compensation in order to demonstrate improved LE strength.    Time 6    Period Weeks    Status On-going                 Plan - 04/17/20 1352    Clinical Impression Statement Patient  continues to lack TKE which is evident  during gait and during exercise. Patient tolerates manual therapy well with minimal increase in extension ROM following. She shows impaired eccentric strength and motor control with lateral step down and requires bilateral UE support to complete. Patient states 80% improvement with PT intervention and states she is limited by only knee extension ROM. Patient will continue to benefit from skilled physical therapy in order to reduce impairment and improve function.    Personal Factors and Comorbidities Comorbidity 1;Behavior Pattern;Fitness;Transportation;Time since onset of injury/illness/exacerbation    Comorbidities chronic knee pain    Examination-Activity Limitations Bathing;Bed Mobility;Bend;Caring for Others;Carry;Locomotion Level;Sit;Sleep;Squat;Stairs;Stand;Transfers    Examination-Participation Restrictions Church;Cleaning;Volunteer;Driving;Meal Prep;Shop;Yard Work    Stability/Clinical Decision Making Stable/Uncomplicated    Rehab Potential Good    PT Frequency 3x / week    PT Duration 6 weeks    PT Treatment/Interventions ADLs/Self Care Home Management;Aquatic Therapy;Biofeedback;Cryotherapy;Electrical Stimulation;Iontophoresis 4mg /ml Dexamethasone;Traction;Moist Heat;Ultrasound;Parrafin;Fluidtherapy;Contrast Bath;DME Instruction;Neuromuscular re-education;Gait training;Stair training;Functional mobility training;Therapeutic activities;Therapeutic exercise;Balance training;Patient/family education;Orthotic Fit/Training;Manual lymph drainage;Manual techniques;Compression bandaging;Passive range of motion;Scar mobilization;Dry needling;Energy conservation;Splinting;Taping;Vasopneumatic Device;Vestibular;Spinal Manipulations;Joint Manipulations    PT Next Visit Plan progress R knee ROM and strengthening, continue manual for pain/mobility and edema reduction PRN. quad strengthing and extension ROM    PT Home Exercise Plan 6/16 quad set, heel slides 03/22/20:  heel prop; 04/06/20: Prone knee hang 2-3 minutes 7/19 heel prop, hamstring isometrics           Patient will benefit from skilled therapeutic intervention in order to improve the following deficits and impairments:  Abnormal gait, Decreased activity tolerance, Decreased balance, Decreased mobility, Decreased endurance, Decreased range of motion, Decreased knowledge of use of DME, Decreased skin integrity, Decreased strength, Difficulty walking, Increased edema, Increased muscle spasms, Impaired flexibility, Improper body mechanics, Pain  Visit Diagnosis: Muscle weakness (generalized)  Stiffness of right knee, not elsewhere classified  Other abnormalities of gait and mobility  Right knee pain, unspecified chronicity     Problem List Patient Active Problem List   Diagnosis Date Noted  . Status post total right knee replacement 03/13/2020  . Primary osteoarthritis of right knee 02/10/2020  . S/P right knee arthroscopy 03/03/2019  . Sciatica of left side 11/19/2018  . ANXIETY 07/20/2010  . HEMATOCHEZIA 07/20/2010    2:37 PM, 04/17/20 Mearl Latin PT, DPT Physical Therapist at [place], [place], [address] Phone: [phone]   Fax:  [phone]  Name: Julie Miles MRN: [id number] Date of Birth: [birth year]

## 2020-04-19 ENCOUNTER — Ambulatory Visit (HOSPITAL_COMMUNITY): Payer: Medicare HMO | Admitting: Physical Therapy

## 2020-04-19 ENCOUNTER — Other Ambulatory Visit: Payer: Self-pay

## 2020-04-19 ENCOUNTER — Encounter (HOSPITAL_COMMUNITY): Payer: Self-pay | Admitting: Physical Therapy

## 2020-04-19 DIAGNOSIS — M6281 Muscle weakness (generalized): Secondary | ICD-10-CM

## 2020-04-19 DIAGNOSIS — M25661 Stiffness of right knee, not elsewhere classified: Secondary | ICD-10-CM | POA: Diagnosis not present

## 2020-04-19 DIAGNOSIS — M25561 Pain in right knee: Secondary | ICD-10-CM | POA: Diagnosis not present

## 2020-04-19 DIAGNOSIS — R2689 Other abnormalities of gait and mobility: Secondary | ICD-10-CM | POA: Diagnosis not present

## 2020-04-19 NOTE — Therapy (Signed)
[facility] [address] [place], [place], [address] Phone: [phone]   Fax:  [phone]  Physical Therapy Treatment/Discharge Summary  Patient Details  Name: Julie Miles MRN: [id number] Date of Birth: 04/13/1951 Referring Provider (PT): Naiping Xu MD   Encounter Date: 04/19/2020   PHYSICAL THERAPY DISCHARGE SUMMARY  Visits from Start of Care: 13  Current functional level related to goals / functional outcomes: See below   Remaining deficits: See below   Education / Equipment: See below  Plan: Patient agrees to discharge.  Patient goals were met. Patient is being discharged due to being pleased with the current functional level.  ?????        PT End of Session - 04/19/20 1345    Visit Number 13    Number of Visits 18    Date for PT Re-Evaluation 04/26/20    Authorization Type [employer] medicare (no visit limit, [id number])    Progress Note Due on Visit 20    PT Start Time 1347    PT Stop Time 1419    PT Time Calculation (min) 32 min    Activity Tolerance Patient tolerated treatment well;No increased pain    Behavior During Therapy WFL for tasks assessed/performed           Past Medical History:  Diagnosis Date  . Anxiety   . History of blood transfusion 1975   at [facility] after vag delivery  . History of kidney stones   . Hypercholesterolemia   . Hypothyroidism   . MRSA (methicillin resistant Staphylococcus aureus) 03/08/2020   confirmed by PCR  . PONV (postoperative nausea and vomiting)     Past Surgical History:  Procedure Laterality Date  . ANTERIOR AND POSTERIOR REPAIR  06/24/2012   Procedure: ANTERIOR (CYSTOCELE) AND POSTERIOR REPAIR (RECTOCELE);  Surgeon: Michelle A Horvath, MD;  Location: [facility];  Service: Gynecology;  Laterality: N/A;  anterior repair  . APPENDECTOMY    . BLADDER SUSPENSION  06/24/2012   Procedure: TRANSVAGINAL TAPE (TVT) PROCEDURE;  Surgeon: Michelle A Horvath, MD;   Location: [facility];  Service: Gynecology;  Laterality: N/A;  . CHOLECYSTECTOMY    . CYSTOSCOPY  06/24/2012   Procedure: CYSTOSCOPY;  Surgeon: Michelle A Horvath, MD;  Location: [facility];  Service: Gynecology;  Laterality: N/A;  . FOOT SURGERY Right   . KNEE ARTHROSCOPY W/ MENISCAL REPAIR Right   . LAPAROSCOPY  06/24/2012   Procedure: LAPAROSCOPY OPERATIVE;  Surgeon: Michelle A Horvath, MD;  Location: [facility];  Service: Gynecology;  Laterality: N/A;  . TOTAL KNEE ARTHROPLASTY Right 03/13/2020   Procedure: RIGHT TOTAL KNEE ARTHROPLASTY;  Surgeon: Xu, Naiping M, MD;  Location: [facility];  Service: Orthopedics;  Laterality: Right;  . TUBAL LIGATION    . VAGINAL HYSTERECTOMY  06/24/2012   Procedure: HYSTERECTOMY VAGINAL;  Surgeon: Michelle A Horvath, MD;  Location: [facility];  Service: Gynecology;  Laterality: N/A;    There were no vitals filed for this visit.   Subjective Assessment - 04/19/20 1345    Subjective Patient states nothing new. No pain today. Her home exercises are going alright. She was able to do the heel prop for about 10 minutes at a time.    How long can you walk comfortably? 15 minutes    Patient Stated Goals to improve function and get back to normal    Currently in Pain? No/denies              [facility] PT Assessment -   04/19/20 0001      Assessment   Medical Diagnosis R TKA    Referring Provider (PT) Naiping Xu MD    Onset Date/Surgical Date 03/13/20    Next MD Visit 7/27    Prior Therapy Yes for meniscus      Cognition   Overall Cognitive Status Within Functional Limits for tasks assessed      Observation/Other Assessments   Observations Ambulates without AD, limited knee extension on RLE    Focus on Therapeutic Outcomes (FOTO)  1% limited      Sensation   Light Touch Impaired Detail    Additional Comments lateral joint line decreased sensation to light touch      AROM   Right Knee Extension 10   lacking   Right Knee Flexion 125      Strength   Right Hip Flexion 4+/5     Left Hip Flexion 4+/5    Right Knee Flexion 5/5    Right Knee Extension 5/5    Left Knee Flexion 5/5    Left Knee Extension 5/5    Right Ankle Dorsiflexion 5/5    Left Ankle Dorsiflexion 5/5                         [facility] Adult PT Treatment/Exercise - 04/19/20 0001      Knee/Hip Exercises: Stretches   Gastroc Stretch Both;3 reps;30 seconds    Gastroc Stretch Limitations slant board      Manual Therapy   Manual Therapy Edema management;Soft tissue mobilization    Manual therapy comments All manual therapy completed separately from other skilled interventions    Edema Management Retrograde massage to right LE to reduce edema    Soft tissue mobilization scar mobilization instruction and demonstration and treatment                  PT Education - 04/19/20 1345    Education Details educated on HEP, mechanics of exercise, LE positioning for improving extension ROM    Person(s) Educated Patient    Methods Explanation;Demonstration    Comprehension Verbalized understanding;Returned demonstration            PT Short Term Goals - 04/10/20 1407      PT SHORT TERM GOAL #1   Title Patient will be independent with HEP in order to improve functional outcomes.    Baseline 7/12: pt reports daily compliance    Time 3    Period Weeks    Status Achieved    Target Date 04/05/20      PT SHORT TERM GOAL #2   Title Patient will report at least 25% improvement in symptoms for improved quality of life.    Baseline 7/12: pt reports 65% improvement    Time 6    Period Weeks    Status Achieved    Target Date 04/05/20             PT Long Term Goals - 04/19/20 1354      PT LONG TERM GOAL #1   Title Patient will report at least 75% improvement in symptoms for improved quality of life.    Baseline 7/19 80%    Time 6    Period Weeks    Status Achieved      PT LONG TERM GOAL #2   Title Patient will improve FOTO score by at least 10 points in order to indicate  improved tolerance to activity.    Time 6      Period Weeks    Status Achieved      PT LONG TERM GOAL #3   Title Patient will be able to ambulate for at least 60 minutes with pain no greater than 1/10 in order to demonstrate improved ability to ambulate in the community.    Time 6    Period Weeks    Status Not Met      PT LONG TERM GOAL #4   Title Patient will be able to navigate stairs with reciprocal pattern without compensation in order to demonstrate improved LE strength.    Time 6    Period Weeks    Status Achieved                 Plan - 04/19/20 1346    Clinical Impression Statement Patient has met 2/2 short term goals and 3/4 long term goals with ability to complete HEP, improved symptoms, improved activity tolerance and improved stair navigation. Remaining goal not met due to patient having difficulty ambulating extended distances secondary to other health conditions besides knee mobility/function. Patient continues to lack end range extension by 10 degrees and shows good flexion ROM. Patient demonstrating good strength overall and she does not feel limited with ADL at this time. Patient continues to have minimal edema and slightly restricted scar tissue. She is educated on performing scar mobilization as well as elevating and using heel prop to promote extension ROM. Patient also educated on returning to physical therapy if needed. Patient discharged from PT at this time.    Personal Factors and Comorbidities Comorbidity 1;Behavior Pattern;Fitness;Transportation;Time since onset of injury/illness/exacerbation    Comorbidities chronic knee pain    Examination-Activity Limitations Bathing;Bed Mobility;Bend;Caring for Others;Carry;Locomotion Level;Sit;Sleep;Squat;Stairs;Stand;Transfers    Examination-Participation Restrictions Church;Cleaning;Volunteer;Driving;Meal Prep;Shop;Yard Work    Stability/Clinical Decision Making Stable/Uncomplicated    Rehab Potential Good    PT  Frequency --    PT Duration --    PT Treatment/Interventions ADLs/Self Care Home Management;Aquatic Therapy;Biofeedback;Cryotherapy;Electrical Stimulation;Iontophoresis 4mg/ml Dexamethasone;Traction;Moist Heat;Ultrasound;Parrafin;Fluidtherapy;Contrast Bath;DME Instruction;Neuromuscular re-education;Gait training;Stair training;Functional mobility training;Therapeutic activities;Therapeutic exercise;Balance training;Patient/family education;Orthotic Fit/Training;Manual lymph drainage;Manual techniques;Compression bandaging;Passive range of motion;Scar mobilization;Dry needling;Energy conservation;Splinting;Taping;Vasopneumatic Device;Vestibular;Spinal Manipulations;Joint Manipulations    PT Next Visit Plan n/a    PT Home Exercise Plan 6/16 quad set, heel slides 03/22/20: heel prop; 04/06/20: Prone knee hang 2-3 minutes 7/19 heel prop, hamstring isometrics           Patient will benefit from skilled therapeutic intervention in order to improve the following deficits and impairments:  Abnormal gait, Decreased activity tolerance, Decreased balance, Decreased mobility, Decreased endurance, Decreased range of motion, Decreased knowledge of use of DME, Decreased skin integrity, Decreased strength, Difficulty walking, Increased edema, Increased muscle spasms, Impaired flexibility, Improper body mechanics, Pain  Visit Diagnosis: Muscle weakness (generalized)  Stiffness of right knee, not elsewhere classified  Other abnormalities of gait and mobility  Right knee pain, unspecified chronicity     Problem List Patient Active Problem List   Diagnosis Date Noted  . Status post total right knee replacement 03/13/2020  . Primary osteoarthritis of right knee 02/10/2020  . S/P right knee arthroscopy 03/03/2019  . Sciatica of left side 11/19/2018  . ANXIETY 07/20/2010  . HEMATOCHEZIA 07/20/2010    2:26 PM, 04/19/20 Andrew S. Zaunegger PT, DPT Physical Therapist at [facility]  [facility] [address] [place], [place], [address] Phone: [phone]   Fax:  [phone]  Name: Julie Miles MRN: [id number] Date of Birth: 08/05/1951   

## 2020-04-21 ENCOUNTER — Encounter (HOSPITAL_COMMUNITY): Payer: Medicare HMO

## 2020-04-24 ENCOUNTER — Encounter (HOSPITAL_COMMUNITY): Payer: Medicare HMO | Admitting: Physical Therapy

## 2020-04-25 ENCOUNTER — Encounter: Payer: Self-pay | Admitting: Orthopaedic Surgery

## 2020-04-25 ENCOUNTER — Ambulatory Visit (INDEPENDENT_AMBULATORY_CARE_PROVIDER_SITE_OTHER): Payer: Medicare HMO | Admitting: Orthopaedic Surgery

## 2020-04-25 ENCOUNTER — Ambulatory Visit (INDEPENDENT_AMBULATORY_CARE_PROVIDER_SITE_OTHER): Payer: Medicare HMO

## 2020-04-25 DIAGNOSIS — Z96651 Presence of right artificial knee joint: Secondary | ICD-10-CM

## 2020-04-25 NOTE — Progress Notes (Signed)
Post-Op Visit Note   Patient: Julie Miles           Date of Birth: January 13, 1951           MRN: [id number] Visit Date: 04/25/2020 PCP: Sharilyn Sites, MD   Assessment & Plan:  Chief Complaint:  Chief Complaint  Patient presents with  . Right Knee - Routine Post Op   Visit Diagnoses:  1. Status post total right knee replacement     Plan: Jasmon 6 weeks status post right total knee replacement.  She has no complaints and doing well overall.  She has some numbness on the lateral side of her knee which I reassured her is normal.  Surgical scar is fully healed.  She has excellent range of motion.  She completed physical therapy yesterday.  She takes Aleve and tramadol as needed.  Swelling has significantly decreased.  Overall she is very happy.  At this point in she can steadily increase her activity as tolerated.  Dental prophylaxis reinforced.  Recheck in 6 weeks.  Follow-Up Instructions: Return in about 6 weeks (around 06/06/2020).   Orders:  Orders Placed This Encounter  Procedures  . XR Knee 1-2 Views Right   No orders of the defined types were placed in this encounter.   Imaging: XR Knee 1-2 Views Right  Result Date: 04/25/2020 Stable total knee replacement in good alignment    PMFS History: Patient Active Problem List   Diagnosis Date Noted  . Status post total right knee replacement 03/13/2020  . Primary osteoarthritis of right knee 02/10/2020  . S/P right knee arthroscopy 03/03/2019  . Sciatica of left side 11/19/2018  . ANXIETY 07/20/2010  . HEMATOCHEZIA 07/20/2010   Past Medical History:  Diagnosis Date  . Anxiety   . History of blood transfusion 1975   at [facility] after vag delivery  . History of kidney stones   . Hypercholesterolemia   . Hypothyroidism   . MRSA (methicillin resistant Staphylococcus aureus) 03/08/2020   confirmed by PCR  . PONV (postoperative nausea and vomiting)     Family History  Problem Relation Age of Onset  .  Heart disease Mother   . Diabetes Mother     Past Surgical History:  Procedure Laterality Date  . ANTERIOR AND POSTERIOR REPAIR  06/24/2012   Procedure: ANTERIOR (CYSTOCELE) AND POSTERIOR REPAIR (RECTOCELE);  Surgeon: Daria Pastures, MD;  Location: [facility];  Service: Gynecology;  Laterality: N/A;  anterior repair  . APPENDECTOMY    . BLADDER SUSPENSION  06/24/2012   Procedure: TRANSVAGINAL TAPE (TVT) PROCEDURE;  Surgeon: Daria Pastures, MD;  Location: [facility];  Service: Gynecology;  Laterality: N/A;  . CHOLECYSTECTOMY    . CYSTOSCOPY  06/24/2012   Procedure: CYSTOSCOPY;  Surgeon: Daria Pastures, MD;  Location: [facility];  Service: Gynecology;  Laterality: N/A;  . FOOT SURGERY Right   . KNEE ARTHROSCOPY W/ MENISCAL REPAIR Right   . LAPAROSCOPY  06/24/2012   Procedure: LAPAROSCOPY OPERATIVE;  Surgeon: Daria Pastures, MD;  Location: [facility];  Service: Gynecology;  Laterality: N/A;  . TOTAL KNEE ARTHROPLASTY Right 03/13/2020   Procedure: RIGHT TOTAL KNEE ARTHROPLASTY;  Surgeon: Leandrew Koyanagi, MD;  Location: [facility];  Service: Orthopedics;  Laterality: Right;  . TUBAL LIGATION    . VAGINAL HYSTERECTOMY  06/24/2012   Procedure: HYSTERECTOMY VAGINAL;  Surgeon: Daria Pastures, MD;  Location: [facility];  Service: Gynecology;  Laterality: N/A;   Social History   Occupational History  .  Not on file  Tobacco Use  . Smoking status: Former Smoker    Quit date: 2006    Years since quitting: 15.5  . Smokeless tobacco: Never Used  . Tobacco comment: STATES SHE DOES NOT SMOKE.  Vaping Use  . Vaping Use: Never used  Substance and Sexual Activity  . Alcohol use: No  . Drug use: No  . Sexual activity: Not on file

## 2020-04-26 ENCOUNTER — Encounter (HOSPITAL_COMMUNITY): Payer: Medicare HMO

## 2020-04-28 ENCOUNTER — Encounter (HOSPITAL_COMMUNITY): Payer: Medicare HMO

## 2020-05-01 ENCOUNTER — Encounter (HOSPITAL_COMMUNITY): Payer: Medicare HMO | Admitting: Physical Therapy

## 2020-05-03 ENCOUNTER — Encounter (HOSPITAL_COMMUNITY): Payer: Medicare HMO | Admitting: Physical Therapy

## 2020-05-05 ENCOUNTER — Encounter (HOSPITAL_COMMUNITY): Payer: Medicare HMO | Admitting: Physical Therapy

## 2020-05-08 ENCOUNTER — Encounter (HOSPITAL_COMMUNITY): Payer: Medicare HMO | Admitting: Physical Therapy

## 2020-05-10 ENCOUNTER — Encounter (HOSPITAL_COMMUNITY): Payer: Medicare HMO

## 2020-05-12 ENCOUNTER — Encounter (HOSPITAL_COMMUNITY): Payer: Medicare HMO

## 2020-05-30 DIAGNOSIS — E039 Hypothyroidism, unspecified: Secondary | ICD-10-CM | POA: Diagnosis not present

## 2020-05-30 DIAGNOSIS — E7849 Other hyperlipidemia: Secondary | ICD-10-CM | POA: Diagnosis not present

## 2020-06-06 ENCOUNTER — Encounter: Payer: Self-pay | Admitting: Orthopaedic Surgery

## 2020-06-06 ENCOUNTER — Ambulatory Visit (INDEPENDENT_AMBULATORY_CARE_PROVIDER_SITE_OTHER): Payer: Medicare HMO | Admitting: Physician Assistant

## 2020-06-06 VITALS — Ht 66.0 in | Wt 210.0 lb

## 2020-06-06 DIAGNOSIS — Z96651 Presence of right artificial knee joint: Secondary | ICD-10-CM

## 2020-06-06 NOTE — Progress Notes (Signed)
Post-Op Visit Note   Patient: Julie Miles           Date of Birth: 05-21-1951           MRN: [id number] Visit Date: 06/06/2020 PCP: Sharilyn Sites, MD   Assessment & Plan:  Chief Complaint:  Chief Complaint  Patient presents with  . Right Knee - Follow-up    Right total knee arthroplasty 03/13/2020   Visit Diagnoses:  1. Hx of total knee replacement, right     Plan: Patient is a pleasant 69 year old female who comes in today 3 months out right total knee replacement 03/13/2020.  She has been doing okay.  She is no longer in formal physical therapy but does admit to occasionally doing exercises on her own.  She takes Advil as needed.  Examination of her right knee reveals near full extension.  Flexion to about 120 degrees.  Stable to valgus varus stress.  Calf is soft nontender.  She is neurovascular intact distally.  At this point, she will continue to work on a home exercise program.  Dental prophylaxis reinforced.  Follow-up with [place] in 9 months time for repeat evaluation and 2 view x-rays of the right knee.  Follow-Up Instructions: Return in about 9 months (around 03/06/2021).   Orders:  No orders of the defined types were placed in this encounter.  No orders of the defined types were placed in this encounter.   Imaging: No new imaging  PMFS History: Patient Active Problem List   Diagnosis Date Noted  . Status post total right knee replacement 03/13/2020  . Primary osteoarthritis of right knee 02/10/2020  . S/P right knee arthroscopy 03/03/2019  . Sciatica of left side 11/19/2018  . ANXIETY 07/20/2010  . HEMATOCHEZIA 07/20/2010   Past Medical History:  Diagnosis Date  . Anxiety   . History of blood transfusion 1975   at [facility] after vag delivery  . History of kidney stones   . Hypercholesterolemia   . Hypothyroidism   . MRSA (methicillin resistant Staphylococcus aureus) 03/08/2020   confirmed by PCR  . PONV (postoperative nausea and vomiting)       Family History  Problem Relation Age of Onset  . Heart disease Mother   . Diabetes Mother     Past Surgical History:  Procedure Laterality Date  . ANTERIOR AND POSTERIOR REPAIR  06/24/2012   Procedure: ANTERIOR (CYSTOCELE) AND POSTERIOR REPAIR (RECTOCELE);  Surgeon: Daria Pastures, MD;  Location: [facility];  Service: Gynecology;  Laterality: N/A;  anterior repair  . APPENDECTOMY    . BLADDER SUSPENSION  06/24/2012   Procedure: TRANSVAGINAL TAPE (TVT) PROCEDURE;  Surgeon: Daria Pastures, MD;  Location: [facility];  Service: Gynecology;  Laterality: N/A;  . CHOLECYSTECTOMY    . CYSTOSCOPY  06/24/2012   Procedure: CYSTOSCOPY;  Surgeon: Daria Pastures, MD;  Location: [facility];  Service: Gynecology;  Laterality: N/A;  . FOOT SURGERY Right   . KNEE ARTHROSCOPY W/ MENISCAL REPAIR Right   . LAPAROSCOPY  06/24/2012   Procedure: LAPAROSCOPY OPERATIVE;  Surgeon: Daria Pastures, MD;  Location: [facility];  Service: Gynecology;  Laterality: N/A;  . TOTAL KNEE ARTHROPLASTY Right 03/13/2020   Procedure: RIGHT TOTAL KNEE ARTHROPLASTY;  Surgeon: Leandrew Koyanagi, MD;  Location: [facility];  Service: Orthopedics;  Laterality: Right;  . TUBAL LIGATION    . VAGINAL HYSTERECTOMY  06/24/2012   Procedure: HYSTERECTOMY VAGINAL;  Surgeon: Daria Pastures, MD;  Location: [facility];  Service:  Gynecology;  Laterality: N/A;   Social History   Occupational History  . Not on file  Tobacco Use  . Smoking status: Former Smoker    Quit date: 2006    Years since quitting: 15.6  . Smokeless tobacco: Never Used  . Tobacco comment: STATES SHE DOES NOT SMOKE.  Vaping Use  . Vaping Use: Never used  Substance and Sexual Activity  . Alcohol use: No  . Drug use: No  . Sexual activity: Not on file

## 2020-06-12 ENCOUNTER — Other Ambulatory Visit: Payer: Medicare HMO

## 2020-06-12 ENCOUNTER — Other Ambulatory Visit: Payer: Self-pay

## 2020-06-12 DIAGNOSIS — Z20822 Contact with and (suspected) exposure to covid-19: Secondary | ICD-10-CM

## 2020-06-14 LAB — SARS-COV-2, NAA 2 DAY TAT

## 2020-06-14 LAB — SPECIMEN STATUS REPORT

## 2020-06-14 LAB — NOVEL CORONAVIRUS, NAA: SARS-CoV-2, NAA: NOT DETECTED

## 2020-07-18 DIAGNOSIS — R69 Illness, unspecified: Secondary | ICD-10-CM | POA: Diagnosis not present

## 2020-07-29 DIAGNOSIS — I10 Essential (primary) hypertension: Secondary | ICD-10-CM | POA: Diagnosis not present

## 2020-07-29 DIAGNOSIS — E039 Hypothyroidism, unspecified: Secondary | ICD-10-CM | POA: Diagnosis not present

## 2020-07-29 DIAGNOSIS — E782 Mixed hyperlipidemia: Secondary | ICD-10-CM | POA: Diagnosis not present

## 2020-07-29 DIAGNOSIS — K219 Gastro-esophageal reflux disease without esophagitis: Secondary | ICD-10-CM | POA: Diagnosis not present

## 2020-07-31 ENCOUNTER — Encounter: Payer: Self-pay | Admitting: Internal Medicine

## 2020-09-26 ENCOUNTER — Other Ambulatory Visit: Payer: Medicare HMO

## 2020-09-26 ENCOUNTER — Other Ambulatory Visit: Payer: Self-pay

## 2020-09-26 DIAGNOSIS — Z20822 Contact with and (suspected) exposure to covid-19: Secondary | ICD-10-CM | POA: Diagnosis not present

## 2020-09-27 LAB — SPECIMEN STATUS REPORT

## 2020-09-27 LAB — SARS-COV-2, NAA 2 DAY TAT

## 2020-09-27 LAB — NOVEL CORONAVIRUS, NAA: SARS-CoV-2, NAA: NOT DETECTED

## 2020-10-24 DIAGNOSIS — Z1331 Encounter for screening for depression: Secondary | ICD-10-CM | POA: Diagnosis not present

## 2020-10-24 DIAGNOSIS — E039 Hypothyroidism, unspecified: Secondary | ICD-10-CM | POA: Diagnosis not present

## 2020-10-24 DIAGNOSIS — E7849 Other hyperlipidemia: Secondary | ICD-10-CM | POA: Diagnosis not present

## 2020-10-24 DIAGNOSIS — Z6836 Body mass index (BMI) 36.0-36.9, adult: Secondary | ICD-10-CM | POA: Diagnosis not present

## 2020-10-24 DIAGNOSIS — Z0001 Encounter for general adult medical examination with abnormal findings: Secondary | ICD-10-CM | POA: Diagnosis not present

## 2020-10-24 DIAGNOSIS — R7309 Other abnormal glucose: Secondary | ICD-10-CM | POA: Diagnosis not present

## 2020-10-24 DIAGNOSIS — E6609 Other obesity due to excess calories: Secondary | ICD-10-CM | POA: Diagnosis not present

## 2020-11-10 ENCOUNTER — Other Ambulatory Visit (HOSPITAL_COMMUNITY): Payer: Self-pay | Admitting: Family Medicine

## 2020-11-10 DIAGNOSIS — Z1231 Encounter for screening mammogram for malignant neoplasm of breast: Secondary | ICD-10-CM

## 2020-11-16 ENCOUNTER — Other Ambulatory Visit: Payer: Self-pay

## 2020-11-16 ENCOUNTER — Ambulatory Visit (HOSPITAL_COMMUNITY)
Admission: RE | Admit: 2020-11-16 | Discharge: 2020-11-16 | Disposition: A | Discharge status: Home / Self Care | Payer: Medicare HMO | Source: Ambulatory Visit | Attending: Family Medicine | Admitting: Family Medicine

## 2020-11-16 DIAGNOSIS — Z1231 Encounter for screening mammogram for malignant neoplasm of breast: Secondary | ICD-10-CM | POA: Diagnosis not present

## 2020-11-23 DIAGNOSIS — L308 Other specified dermatitis: Secondary | ICD-10-CM | POA: Diagnosis not present

## 2020-11-23 DIAGNOSIS — X32XXXD Exposure to sunlight, subsequent encounter: Secondary | ICD-10-CM | POA: Diagnosis not present

## 2020-11-23 DIAGNOSIS — L820 Inflamed seborrheic keratosis: Secondary | ICD-10-CM | POA: Diagnosis not present

## 2020-11-23 DIAGNOSIS — L570 Actinic keratosis: Secondary | ICD-10-CM | POA: Diagnosis not present

## 2021-01-01 DIAGNOSIS — H524 Presbyopia: Secondary | ICD-10-CM | POA: Diagnosis not present

## 2021-02-05 DIAGNOSIS — C44529 Squamous cell carcinoma of skin of other part of trunk: Secondary | ICD-10-CM | POA: Diagnosis not present

## 2021-02-05 DIAGNOSIS — L570 Actinic keratosis: Secondary | ICD-10-CM | POA: Diagnosis not present

## 2021-02-05 DIAGNOSIS — D485 Neoplasm of uncertain behavior of skin: Secondary | ICD-10-CM | POA: Diagnosis not present

## 2021-02-22 DIAGNOSIS — L988 Other specified disorders of the skin and subcutaneous tissue: Secondary | ICD-10-CM | POA: Diagnosis not present

## 2021-02-22 DIAGNOSIS — C44529 Squamous cell carcinoma of skin of other part of trunk: Secondary | ICD-10-CM | POA: Diagnosis not present

## 2021-02-27 DIAGNOSIS — E039 Hypothyroidism, unspecified: Secondary | ICD-10-CM | POA: Diagnosis not present

## 2021-02-27 DIAGNOSIS — E7849 Other hyperlipidemia: Secondary | ICD-10-CM | POA: Diagnosis not present

## 2021-03-15 ENCOUNTER — Ambulatory Visit: Payer: Medicare HMO | Admitting: Orthopaedic Surgery

## 2021-03-15 ENCOUNTER — Ambulatory Visit: Payer: Self-pay

## 2021-03-15 DIAGNOSIS — Z96651 Presence of right artificial knee joint: Secondary | ICD-10-CM

## 2021-03-15 MED ORDER — AMOXICILLIN 500 MG PO CAPS
2000.0000 mg | ORAL_CAPSULE | Freq: Once | ORAL | 6 refills | Status: AC
Start: 2021-03-15 — End: 2021-03-15

## 2021-03-15 NOTE — Progress Notes (Signed)
Post-Op Visit Note   Patient: Julie Miles           Date of Birth: [birth year]           MRN: [id number] Visit Date: 03/15/2021 PCP: Sharilyn Sites, MD   Assessment & Plan:  Chief Complaint:  Chief Complaint  Patient presents with   Right Knee - Pain   Visit Diagnoses:  1. Hx of total knee replacement, right     Plan: Skie is 1 year status post right total knee replacement.  She has numbness on the lateral side of the knee.  She feels like she is not able to walk for too long a distance or time because lateral hip pain.  Denies any groin pain.  She has gained 20 pounds in the last year.  Right knee shows a fully healed surgical scar.  Stable to varus valgus.  No joint effusion.  Range of motion is normal.  X-rays of the knee replacement are unremarkable.  From my standpoint the knee replacement is doing well.  Her problems stem from the 20 pounds of weight gain which is causing moderate abductor issues.  We had a long discussion about the importance of weight loss and how this impacts joint problems.  We will see her back in another year with two-view x-rays of the right knee.  Dental prophylaxis was reinforced.  Follow-Up Instructions: Return in about 1 year (around 03/15/2022).   Orders:  Orders Placed This Encounter  Procedures   XR KNEE 3 VIEW RIGHT   Meds ordered this encounter  Medications   amoxicillin (AMOXIL) 500 MG capsule    Sig: Take 4 capsules (2,000 mg total) by mouth once for 1 dose.    Dispense:  4 capsule    Refill:  6    Imaging: XR KNEE 3 VIEW RIGHT  Result Date: 03/15/2021 Stable total knee replacement without complication.   PMFS History: Patient Active Problem List   Diagnosis Date Noted   Status post total right knee replacement 03/13/2020   Primary osteoarthritis of right knee 02/10/2020   S/P right knee arthroscopy 03/03/2019   Sciatica of left side 11/19/2018   ANXIETY 07/20/2010   HEMATOCHEZIA 07/20/2010   Past Medical  History:  Diagnosis Date   Anxiety    History of blood transfusion 1975   at [facility] after vag delivery   History of kidney stones    Hypercholesterolemia    Hypothyroidism    MRSA (methicillin resistant Staphylococcus aureus) 03/08/2020   confirmed by PCR   PONV (postoperative nausea and vomiting)     Family History  Problem Relation Age of Onset   Heart disease Mother    Diabetes Mother     Past Surgical History:  Procedure Laterality Date   ANTERIOR AND POSTERIOR REPAIR  06/24/2012   Procedure: ANTERIOR (CYSTOCELE) AND POSTERIOR REPAIR (RECTOCELE);  Surgeon: Daria Pastures, MD;  Location: [facility];  Service: Gynecology;  Laterality: N/A;  anterior repair   APPENDECTOMY     BLADDER SUSPENSION  06/24/2012   Procedure: TRANSVAGINAL TAPE (TVT) PROCEDURE;  Surgeon: Daria Pastures, MD;  Location: [facility];  Service: Gynecology;  Laterality: N/A;   CHOLECYSTECTOMY     CYSTOSCOPY  06/24/2012   Procedure: CYSTOSCOPY;  Surgeon: Daria Pastures, MD;  Location: [facility];  Service: Gynecology;  Laterality: N/A;   FOOT SURGERY Right    KNEE ARTHROSCOPY W/ MENISCAL REPAIR Right    LAPAROSCOPY  06/24/2012   Procedure: LAPAROSCOPY OPERATIVE;  Surgeon: Daria Pastures, MD;  Location: [facility];  Service: Gynecology;  Laterality: N/A;   TOTAL KNEE ARTHROPLASTY Right 03/13/2020   Procedure: RIGHT TOTAL KNEE ARTHROPLASTY;  Surgeon: Leandrew Koyanagi, MD;  Location: [facility];  Service: Orthopedics;  Laterality: Right;   TUBAL LIGATION     VAGINAL HYSTERECTOMY  06/24/2012   Procedure: HYSTERECTOMY VAGINAL;  Surgeon: Daria Pastures, MD;  Location: [facility];  Service: Gynecology;  Laterality: N/A;   Social History   Occupational History   Not on file  Tobacco Use   Smoking status: Former    Pack years: 0.00    Types: Cigarettes    Quit date: 2006    Years since quitting: 16.4   Smokeless tobacco: Never   Tobacco comments:    STATES SHE DOES NOT SMOKE.  Vaping Use   Vaping Use:  Never used  Substance and Sexual Activity   Alcohol use: No   Drug use: No   Sexual activity: Not on file

## 2021-03-28 DIAGNOSIS — E039 Hypothyroidism, unspecified: Secondary | ICD-10-CM | POA: Diagnosis not present

## 2021-03-28 DIAGNOSIS — R7309 Other abnormal glucose: Secondary | ICD-10-CM | POA: Diagnosis not present

## 2021-03-28 DIAGNOSIS — E7849 Other hyperlipidemia: Secondary | ICD-10-CM | POA: Diagnosis not present

## 2021-03-28 DIAGNOSIS — Z6837 Body mass index (BMI) 37.0-37.9, adult: Secondary | ICD-10-CM | POA: Diagnosis not present

## 2021-04-29 DIAGNOSIS — E7849 Other hyperlipidemia: Secondary | ICD-10-CM | POA: Diagnosis not present

## 2021-04-29 DIAGNOSIS — E039 Hypothyroidism, unspecified: Secondary | ICD-10-CM | POA: Diagnosis not present

## 2021-07-12 DIAGNOSIS — Z23 Encounter for immunization: Secondary | ICD-10-CM | POA: Diagnosis not present

## 2021-08-06 DIAGNOSIS — L820 Inflamed seborrheic keratosis: Secondary | ICD-10-CM | POA: Diagnosis not present

## 2021-08-06 DIAGNOSIS — L570 Actinic keratosis: Secondary | ICD-10-CM | POA: Diagnosis not present

## 2021-08-06 DIAGNOSIS — D485 Neoplasm of uncertain behavior of skin: Secondary | ICD-10-CM | POA: Diagnosis not present

## 2021-08-06 DIAGNOSIS — L309 Dermatitis, unspecified: Secondary | ICD-10-CM | POA: Diagnosis not present

## 2021-10-19 ENCOUNTER — Other Ambulatory Visit (HOSPITAL_COMMUNITY): Payer: Self-pay | Admitting: Family Medicine

## 2021-10-19 DIAGNOSIS — Z1231 Encounter for screening mammogram for malignant neoplasm of breast: Secondary | ICD-10-CM

## 2021-10-26 DIAGNOSIS — M1711 Unilateral primary osteoarthritis, right knee: Secondary | ICD-10-CM | POA: Diagnosis not present

## 2021-10-26 DIAGNOSIS — Z1331 Encounter for screening for depression: Secondary | ICD-10-CM | POA: Diagnosis not present

## 2021-10-26 DIAGNOSIS — E782 Mixed hyperlipidemia: Secondary | ICD-10-CM | POA: Diagnosis not present

## 2021-10-26 DIAGNOSIS — Z6836 Body mass index (BMI) 36.0-36.9, adult: Secondary | ICD-10-CM | POA: Diagnosis not present

## 2021-10-26 DIAGNOSIS — E039 Hypothyroidism, unspecified: Secondary | ICD-10-CM | POA: Diagnosis not present

## 2021-10-26 DIAGNOSIS — E6609 Other obesity due to excess calories: Secondary | ICD-10-CM | POA: Diagnosis not present

## 2021-10-26 DIAGNOSIS — Z0000 Encounter for general adult medical examination without abnormal findings: Secondary | ICD-10-CM | POA: Diagnosis not present

## 2021-10-26 DIAGNOSIS — E7849 Other hyperlipidemia: Secondary | ICD-10-CM | POA: Diagnosis not present

## 2021-10-26 DIAGNOSIS — R7309 Other abnormal glucose: Secondary | ICD-10-CM | POA: Diagnosis not present

## 2021-11-19 ENCOUNTER — Ambulatory Visit (HOSPITAL_COMMUNITY): Payer: Medicare HMO

## 2021-11-21 ENCOUNTER — Emergency Department (HOSPITAL_COMMUNITY)
Admission: EM | Admit: 2021-11-21 | Discharge: 2021-11-21 | Discharge status: Against Medical Advice | Payer: Medicare HMO

## 2021-11-28 ENCOUNTER — Encounter: Payer: Self-pay | Admitting: Orthopedic Surgery

## 2021-11-28 ENCOUNTER — Ambulatory Visit: Payer: Medicare HMO

## 2021-11-28 ENCOUNTER — Other Ambulatory Visit: Payer: Self-pay

## 2021-11-28 ENCOUNTER — Ambulatory Visit: Payer: Medicare HMO | Admitting: Orthopedic Surgery

## 2021-11-28 VITALS — BP 151/72 | HR 77 | Ht 66.0 in | Wt 217.0 lb

## 2021-11-28 DIAGNOSIS — M1812 Unilateral primary osteoarthritis of first carpometacarpal joint, left hand: Secondary | ICD-10-CM | POA: Diagnosis not present

## 2021-11-28 NOTE — Patient Instructions (Addendum)
Instructions Following Joint Injections ? ?In clinic today, you received an injection in one of your joints (sometimes more than one).  Occasionally, you can have some pain at the injection site, this is normal.  You can place ice at the injection site, or take over-the-counter medications such as Tylenol (acetaminophen) or Advil (ibuprofen).  Please follow all directions listed on the bottle. ? ?If your joint (knee or shoulder) becomes swollen, red or very painful, please contact the clinic for additional assistance.  ? ?Two medications were injected, including lidocaine and a steroid (often referred to as cortisone).  Lidocaine is effective almost immediately but wears off quickly.  However, the steroid can take a few days to improve your symptoms.  In some cases, it can make your pain worse for a couple of days.  Do not be concerned if this happens as it is common.  You can apply ice or take some over-the-counter medications as needed.  ? ? ?Continue with medications like tylenol or ibuprofen/naproxen as needed for pain.  Can consider a referral to hand therapy for a brace.  ?

## 2021-11-28 NOTE — Progress Notes (Signed)
New Patient Visit ? ?Assessment: ?Julie Miles is a 71 y.o. RHD female with the following: ?1. Arthritis of carpometacarpal (CMC) joint of left thumb ? ? ?Plan: ?Pain at the base of the left thumb.  No injury.  XR show mild to moderate arthritis at the [facility] joint.  She is interested in an injection in clinic today.  This was completed without issue.  Can consider medications, OT or a hand based splint if does not improve.  Follow up as needed. ? ? ?Procedure note injection - LeftThumb CMC joint ?  ?Verbal consent was obtained to inject the Left Thumb CMC joint ?Timeout was completed to confirm the site of injection.  The skin was prepped with alcohol and ethyl chloride was sprayed at the injection site.  ?A 21-gauge needle was used to inject 40 mg of Depo-Medrol and 1% lidocaine (1 cc) into the Left Thumb CMC joint using a direct anterior approach.  ?There were no complications. Patient tolerated the procedure well. A sterile bandage was applied ?  ? ? ?Follow-up: ?Return if symptoms worsen or fail to improve. ? ?Subjective: ? ?Chief Complaint  ?Patient presents with  ? Hand Pain  ?  L hand pain, for about a year.  NKI, + numbness in thumb, base of thumb and along the palm to pinky.  RHD.    ? ? ?History of Present Illness: ?Julie Miles is a 71 y.o. female who presents for evaluation of left hand pain. She has had pain at the base of her thumb for about a year.  No specific injury.  She denies numbness and tingling.  Pain does get worse at night.  She has tried medications, but not on a consistent basis.  She has never had an injection.  She has not tried a brace. ? ? ?Review of Systems: ?No fevers or chills ?No numbness or tingling ?No chest pain ?No shortness of breath ?No bowel or bladder dysfunction ?No GI distress ?No headaches ? ? ?Medical History: ? ?Past Medical History:  ?Diagnosis Date  ? Anxiety   ? History of blood transfusion 1975  ? at [facility] after vag delivery  ? History of  kidney stones   ? Hypercholesterolemia   ? Hypothyroidism   ? MRSA (methicillin resistant Staphylococcus aureus) 03/08/2020  ? confirmed by PCR  ? PONV (postoperative nausea and vomiting)   ? ? ?Past Surgical History:  ?Procedure Laterality Date  ? ANTERIOR AND POSTERIOR REPAIR  06/24/2012  ? Procedure: ANTERIOR (CYSTOCELE) AND POSTERIOR REPAIR (RECTOCELE);  Surgeon: Daria Pastures, MD;  Location: [facility];  Service: Gynecology;  Laterality: N/A;  anterior repair  ? APPENDECTOMY    ? BLADDER SUSPENSION  06/24/2012  ? Procedure: TRANSVAGINAL TAPE (TVT) PROCEDURE;  Surgeon: Daria Pastures, MD;  Location: [facility];  Service: Gynecology;  Laterality: N/A;  ? CHOLECYSTECTOMY    ? CYSTOSCOPY  06/24/2012  ? Procedure: CYSTOSCOPY;  Surgeon: Daria Pastures, MD;  Location: [facility];  Service: Gynecology;  Laterality: N/A;  ? FOOT SURGERY Right   ? KNEE ARTHROSCOPY W/ MENISCAL REPAIR Right   ? LAPAROSCOPY  06/24/2012  ? Procedure: LAPAROSCOPY OPERATIVE;  Surgeon: Daria Pastures, MD;  Location: [facility];  Service: Gynecology;  Laterality: N/A;  ? TOTAL KNEE ARTHROPLASTY Right 03/13/2020  ? Procedure: RIGHT TOTAL KNEE ARTHROPLASTY;  Surgeon: Leandrew Koyanagi, MD;  Location: [facility];  Service: Orthopedics;  Laterality: Right;  ? TUBAL LIGATION    ? VAGINAL HYSTERECTOMY  06/24/2012  ? Procedure: HYSTERECTOMY VAGINAL;  Surgeon: Daria Pastures, MD;  Location: [facility];  Service: Gynecology;  Laterality: N/A;  ? ? ?Family History  ?Problem Relation Age of Onset  ? Heart disease Mother   ? Diabetes Mother   ? ?Social History  ? ?Tobacco Use  ? Smoking status: Former  ?  Types: Cigarettes  ?  Quit date: 2006  ?  Years since quitting: 17.1  ? Smokeless tobacco: Never  ? Tobacco comments:  ?  STATES SHE DOES NOT SMOKE.  ?Vaping Use  ? Vaping Use: Never used  ?Substance Use Topics  ? Alcohol use: No  ? Drug use: No  ? ? ?Allergies  ?Allergen Reactions  ? Codeine Nausea And Vomiting  ? ? ?Current Meds  ?Medication Sig  ? ALPRAZolam (XANAX)  1 MG tablet Take 1 mg by mouth at bedtime.   ? aspirin EC 81 MG tablet Take 1 tablet (81 mg total) by mouth 2 (two) times daily.  ? levothyroxine (SYNTHROID, LEVOTHROID) 50 MCG tablet Take 50 mcg by mouth daily before breakfast.  ? simvastatin (ZOCOR) 40 MG tablet Take 1 tablet by mouth daily.  ? ? ?Objective: ?BP (!) 151/72   Pulse 77   Ht 5\' 6"  (1.676 m)   Wt 217 lb (98.4 kg)   BMI 35.02 kg/m?  ? ?Physical Exam: ? ?General: Alert and oriented. and No acute distress. ?Gait: Normal gait. ? ?Left hand without deformity.  No swelling is appreciated.  Tenderness to palpation at the base of the thumb.  Pain with CMC grind test.  Negative Tinel's.  Fingers are warm and well-perfused.  Sensation is intact throughout the left hand. ? ?IMAGING: ?I personally ordered and reviewed the following images ? ?X-rays of the left hand demonstrates no acute injury.  There are mild to moderate degenerative changes at the first [facility] joint.  No dislocations. ? ?Impression: Mild to moderate left thumb CMC arthritis ?New Medications:  ?No orders of the defined types were placed in this encounter. ? ? ? ? ?Mordecai Rasmussen, MD ? ?11/28/2021 ?4:20 PM ? ? ?

## 2021-11-29 ENCOUNTER — Ambulatory Visit (HOSPITAL_COMMUNITY)
Admission: RE | Admit: 2021-11-29 | Discharge: 2021-11-29 | Disposition: A | Discharge status: Home / Self Care | Payer: Medicare HMO | Source: Ambulatory Visit | Attending: Family Medicine | Admitting: Family Medicine

## 2021-11-29 DIAGNOSIS — Z6836 Body mass index (BMI) 36.0-36.9, adult: Secondary | ICD-10-CM | POA: Diagnosis not present

## 2021-11-29 DIAGNOSIS — E6609 Other obesity due to excess calories: Secondary | ICD-10-CM | POA: Diagnosis not present

## 2021-11-29 DIAGNOSIS — Z1231 Encounter for screening mammogram for malignant neoplasm of breast: Secondary | ICD-10-CM | POA: Insufficient documentation

## 2021-11-29 DIAGNOSIS — Z8249 Family history of ischemic heart disease and other diseases of the circulatory system: Secondary | ICD-10-CM | POA: Diagnosis not present

## 2022-01-24 ENCOUNTER — Encounter: Payer: Self-pay | Admitting: Cardiology

## 2022-01-24 ENCOUNTER — Other Ambulatory Visit (HOSPITAL_COMMUNITY)
Admission: RE | Admit: 2022-01-24 | Discharge: 2022-01-24 | Disposition: A | Discharge status: Home / Self Care | Payer: Medicare HMO | Source: Ambulatory Visit | Attending: Cardiology | Admitting: Cardiology

## 2022-01-24 ENCOUNTER — Ambulatory Visit: Payer: Medicare HMO | Admitting: Cardiology

## 2022-01-24 VITALS — BP 138/72 | HR 80 | Ht 66.0 in | Wt 217.0 lb

## 2022-01-24 DIAGNOSIS — R0609 Other forms of dyspnea: Secondary | ICD-10-CM

## 2022-01-24 DIAGNOSIS — Z8249 Family history of ischemic heart disease and other diseases of the circulatory system: Secondary | ICD-10-CM | POA: Diagnosis not present

## 2022-01-24 DIAGNOSIS — I208 Other forms of angina pectoris: Secondary | ICD-10-CM | POA: Diagnosis not present

## 2022-01-24 DIAGNOSIS — I2089 Other forms of angina pectoris: Secondary | ICD-10-CM | POA: Insufficient documentation

## 2022-01-24 LAB — BASIC METABOLIC PANEL
Anion gap: 7 (ref 5–15)
BUN: 19 mg/dL (ref 8–23)
CO2: 27 mmol/L (ref 22–32)
Calcium: 8.8 mg/dL — ABNORMAL LOW (ref 8.9–10.3)
Chloride: 106 mmol/L (ref 98–111)
Creatinine, Ser: 1.13 mg/dL — ABNORMAL HIGH (ref 0.44–1.00)
GFR, Estimated: 53 mL/min — ABNORMAL LOW (ref >60)
Glucose, Bld: 118 mg/dL — ABNORMAL HIGH (ref 70–99)
Potassium: 4 mmol/L (ref 3.5–5.1)
Sodium: 140 mmol/L (ref 135–145)

## 2022-01-24 MED ORDER — METOPROLOL TARTRATE 100 MG PO TABS: 100.0000 mg | ORAL_TABLET | Freq: Two times a day (BID) | ORAL | 0 refills | Status: DC

## 2022-01-24 NOTE — Patient Instructions (Signed)
Medication Instructions:  ?Your physician recommends that you continue on your current medications as directed. Please refer to the Current Medication list given to you today. ? ? ?Labwork: ? ?BMET today at [facility] lab ? ?Testing/Procedures: ?Your physician has requested that you have an echocardiogram. Echocardiography is a painless test that uses sound waves to create images of your heart. It provides your doctor with information about the size and shape of your heart and how well your heart?s chambers and valves are working. This procedure takes approximately one hour. There are no restrictions for this procedure. ? ? ? ?Your physician has requested that you have cardiac CT. Cardiac computed tomography (CT) is a painless test that uses an x-ray machine to take clear, detailed pictures of your heart. For further information please visit HugeFiesta.[email or web address]. Please follow instruction sheet as given. ?  ? ?Follow-Up: ?As needed ? ?Any Other Special Instructions Will Be Listed Below (If Applicable). ? ?If you need a refill on your cardiac medications before your next appointment, please call your pharmacy. ? ?

## 2022-01-24 NOTE — Assessment & Plan Note (Signed)
Her mother had PCI/balloon angioplasty.  She ended up dying in her 51s. ?

## 2022-01-24 NOTE — Assessment & Plan Note (Signed)
We will check an echocardiogram to ensure proper structure and function of her heart.  Certainly deconditioning can be playing a role. ?

## 2022-01-24 NOTE — Assessment & Plan Note (Signed)
Difficulty with minimal movement, significant exertional fatigue/shortness of breath/angina which may be her anginal equivalent.  We will go ahead and check a coronary CT scan to make sure that she does not have any flow-limiting coronary artery disease present.  If plaque is present, her LDL goal will be less than 70.  It may be beneficial to change her simvastatin 40 mg over to Crestor 20 mg. ?

## 2022-01-24 NOTE — Progress Notes (Signed)
?Cardiology Office Note:   ? ?Date:  01/24/2022  ? ?ID:  Julie Miles, DOB 1951/03/11, MRN [id number] ? ?PCP:  Sharilyn Sites, MD ?  ?[facility] Providers ?Cardiologist:  None    ? ?Referring MD: Sharilyn Sites, MD  ? ? ?History of Present Illness:   ? ?Julie Miles is a 71 y.o. female here to evaluate family history of coronary artery disease at the request of Dr. Sharilyn Sites. ? ?Quit smoking years ago. More sedentary. House chores, winded. Stopped going to [place]. Can not walk more than a block. Can feel heart beating all over. Father died in car accident at 14. Mother had PCI/PTCA died at 2. 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 On phone, they hear dyspnea.  ? ?Worked in [employer] fund[employer]. Both knees replaced. ? ?Past Medical History:  ?Diagnosis Date  ? Anxiety   ? History of blood transfusion 1975  ? at [facility] after vag delivery  ? History of kidney stones   ? Hypercholesterolemia   ? Hypothyroidism   ? MRSA (methicillin resistant Staphylococcus aureus) 03/08/2020  ? confirmed by PCR  ? PONV (postoperative nausea and vomiting)   ? ? ?Past Surgical History:  ?Procedure Laterality Date  ? ANTERIOR AND POSTERIOR REPAIR  06/24/2012  ? Procedure: ANTERIOR (CYSTOCELE) AND POSTERIOR REPAIR (RECTOCELE);  Surgeon: Daria Pastures, MD;  Location: [facility];  Service: Gynecology;  Laterality: N/A;  anterior repair  ? APPENDECTOMY    ? BLADDER SUSPENSION  06/24/2012  ? Procedure: TRANSVAGINAL TAPE (TVT) PROCEDURE;  Surgeon: Daria Pastures, MD;  Location: [facility];  Service: Gynecology;  Laterality: N/A;  ? CHOLECYSTECTOMY    ? CYSTOSCOPY  06/24/2012  ? Procedure: CYSTOSCOPY;  Surgeon: Daria Pastures, MD;  Location: [facility];  Service: Gynecology;  Laterality: N/A;  ? FOOT SURGERY Right   ? KNEE ARTHROSCOPY W/ MENISCAL REPAIR Right   ? LAPAROSCOPY  06/24/2012  ? Procedure: LAPAROSCOPY OPERATIVE;  Surgeon: Daria Pastures, MD;  Location: [facility];  Service: Gynecology;  Laterality: N/A;  ? TOTAL KNEE  ARTHROPLASTY Right 03/13/2020  ? Procedure: RIGHT TOTAL KNEE ARTHROPLASTY;  Surgeon: Leandrew Koyanagi, MD;  Location: [place];  Service: Orthopedics;  Laterality: Right;  ? TUBAL LIGATION    ? VAGINAL HYSTERECTOMY  06/24/2012  ? Procedure: HYSTERECTOMY VAGINAL;  Surgeon: Daria Pastures, MD;  Location: [facility];  Service: Gynecology;  Laterality: N/A;  ? ? ?Current Medications: ?Current Meds  ?Medication Sig  ? ALPRAZolam (XANAX) 1 MG tablet Take 1 mg by mouth at bedtime.   ? aspirin EC 81 MG tablet Take 1 tablet (81 mg total) by mouth 2 (two) times daily.  ? levothyroxine (SYNTHROID, LEVOTHROID) 50 MCG tablet Take 50 mcg by mouth daily before breakfast.  ? metoprolol tartrate (LOPRESSOR) 100 MG tablet Take 1 tablet (100 mg total) by mouth 2 (two) times daily.  ? simvastatin (ZOCOR) 40 MG tablet Take 1 tablet by mouth daily.  ?  ? ?Allergies:   Codeine  ? ?Social History  ? ?Socioeconomic History  ? Marital status: Divorced  ?  Spouse name: Not on file  ? Number of children: Not on file  ? Years of education: Not on file  ? Highest education level: Not on file  ?Occupational History  ? Not on file  ?Tobacco Use  ? Smoking status: Former  ?  Types: Cigarettes  ?  Quit date: 2006  ?  Years since quitting: 17.3  ? Smokeless tobacco: Never  ?  Tobacco comments:  ?  STATES SHE DOES NOT SMOKE.  ?Vaping Use  ? Vaping Use: Never used  ?Substance and Sexual Activity  ? Alcohol use: No  ? Drug use: No  ? Sexual activity: Not on file  ?Other Topics Concern  ? Not on file  ?Social History Narrative  ? Not on file  ? ?Social Determinants of Health  ? ?Financial Resource Strain: Not on file  ?Food Insecurity: Not on file  ?Transportation Needs: Not on file  ?Physical Activity: Not on file  ?Stress: Not on file  ?Social Connections: Not on file  ?  ? ?Family History: ?The patient's family history includes Diabetes in her mother; Heart disease in her mother. ? ?ROS:   ?Please see the history of present illness.    ?No fevers chills nausea  vomiting syncope all other systems reviewed and are negative. ? ?EKGs/Labs/Other Studies Reviewed:   ? ? ? ?EKG:  EKG is  ordered today.  The ekg ordered today demonstrates normal sinus rhythm 80 with nonspecific ST-T wave changes ? ?Recent Labs: ?01/24/2022: BUN 19; Creatinine, Ser 1.13; Potassium 4.0; Sodium 140  ?Recent Lipid Panel ?No results found for: CHOL, TRIG, HDL, CHOLHDL, VLDL, LDLCALC, LDLDIRECT ? ?Prior glucose 196, creatinine 1.04 potassium 4.4 hemoglobin 12.1 ? ?Risk Assessment/Calculations:   ? ? ?    ? ?   ? ?Physical Exam:   ? ?VS:  BP 138/72   Pulse 80   Ht '5\' 6"'$  (1.676 m)   Wt 217 lb (98.4 kg)   SpO2 97%   BMI 35.02 kg/m?    ? ?Wt Readings from Last 3 Encounters:  ?01/24/22 217 lb (98.4 kg)  ?11/28/21 217 lb (98.4 kg)  ?06/06/20 210 lb (95.3 kg)  ?  ? ?GEN:  Well nourished, well developed in no acute distress ?HEENT: Normal ?NECK: No JVD; No carotid bruits ?LYMPHATICS: No lymphadenopathy ?CARDIAC: RRR, no murmurs, no rubs, gallops ?RESPIRATORY:  Clear to auscultation without rales, wheezing or rhonchi  ?ABDOMEN: Soft, non-tender, non-distended ?MUSCULOSKELETAL:  No edema; No deformity  ?SKIN: Warm and dry ?NEUROLOGIC:  Alert and oriented x 3 ?PSYCHIATRIC:  Normal affect  ? ?ASSESSMENT:   ? ?1. Exertional angina (HCC)   ?2. Family history of premature CAD   ?3. Family history of early CAD   ?4. Dyspnea on exertion   ? ?PLAN:   ? ?In order of problems listed above: ? ?Dyspnea on exertion ?We will check an echocardiogram to ensure proper structure and function of her heart.  Certainly deconditioning can be playing a role. ? ?Exertional angina (HCC) ?Difficulty with minimal movement, significant exertional fatigue/shortness of breath/angina which may be her anginal equivalent.  We will go ahead and check a coronary CT scan to make sure that she does not have any flow-limiting coronary artery disease present.  If plaque is present, her LDL goal will be less than 70.  It may be beneficial to change  her simvastatin 40 mg over to Crestor 20 mg. ? ?Family history of early CAD ?Her mother had PCI/balloon angioplasty.  She ended up dying in her 22s. ?  ? ? ?  ? ? ?Medication Adjustments/Labs and Tests Ordered: ?Current medicines are reviewed at length with the patient today.  Concerns regarding medicines are outlined above.  ?Orders Placed This Encounter  ?Procedures  ? CT CORONARY MORPH W/CTA COR W/SCORE W/CA W/CM &/OR WO/CM  ? Basic metabolic panel  ? EKG 12-Lead  ? ECHOCARDIOGRAM COMPLETE  ? ?Meds ordered this encounter  ?  Medications  ? metoprolol tartrate (LOPRESSOR) 100 MG tablet  ?  Sig: Take 1 tablet (100 mg total) by mouth 2 (two) times daily.  ?  Dispense:  1 tablet  ?  Refill:  0  ?  Take 100 mg (1 tablet) 2 hours before cardiac ct  ? ? ?Patient Instructions  ?Medication Instructions:  ?Your physician recommends that you continue on your current medications as directed. Please refer to the Current Medication list given to you today. ? ? ?Labwork: ? ?BMET today at [facility] lab ? ?Testing/Procedures: ?Your physician has requested that you have an echocardiogram. Echocardiography is a painless test that uses sound waves to create images of your heart. It provides your doctor with information about the size and shape of your heart and how well your heart?s chambers and valves are working. This procedure takes approximately one hour. There are no restrictions for this procedure. ? ? ? ?Your physician has requested that you have cardiac CT. Cardiac computed tomography (CT) is a painless test that uses an x-ray machine to take clear, detailed pictures of your heart. For further information please visit [email or web address].[email or web address]. Please follow instruction sheet as given. ?  ? ?Follow-Up: ?As needed ? ?Any Other Special Instructions Will Be Listed Below (If Applicable). ? ?If you need a refill on your cardiac medications before your next appointment, please call your pharmacy. ?  ? ?Signed, ?Candee Furbish, MD   ?01/24/2022 4:57 PM    ?[place] ?

## 2022-01-25 ENCOUNTER — Ambulatory Visit (HOSPITAL_COMMUNITY)
Admission: RE | Admit: 2022-01-25 | Discharge: 2022-01-25 | Disposition: A | Discharge status: Home / Self Care | Payer: Medicare HMO | Source: Ambulatory Visit | Attending: Cardiology | Admitting: Cardiology

## 2022-01-25 DIAGNOSIS — R0609 Other forms of dyspnea: Secondary | ICD-10-CM | POA: Insufficient documentation

## 2022-01-25 LAB — ECHOCARDIOGRAM COMPLETE
Area-P 1/2: 3.53 cm2
Calc EF: 68.1 %
S' Lateral: 2.5 cm
Single Plane A2C EF: 70.3 %
Single Plane A4C EF: 65.4 %

## 2022-01-25 NOTE — Progress Notes (Signed)
?  Echocardiogram ?2D Echocardiogram has been performed. ? ?Julie Miles ?01/25/2022, 10:11 AM ?

## 2022-01-29 DIAGNOSIS — E6609 Other obesity due to excess calories: Secondary | ICD-10-CM | POA: Diagnosis not present

## 2022-01-29 DIAGNOSIS — J069 Acute upper respiratory infection, unspecified: Secondary | ICD-10-CM | POA: Diagnosis not present

## 2022-01-29 DIAGNOSIS — Z6835 Body mass index (BMI) 35.0-35.9, adult: Secondary | ICD-10-CM | POA: Diagnosis not present

## 2022-02-07 ENCOUNTER — Telehealth (HOSPITAL_COMMUNITY): Payer: Self-pay | Admitting: *Deleted

## 2022-02-07 NOTE — Telephone Encounter (Signed)
Reaching out to patient to offer assistance regarding upcoming cardiac imaging study; pt verbalizes understanding of appt date/time, parking situation and where to check in, pre-test NPO status and medications ordered, and verified current allergies; name and call back number provided for further questions should they arise ? ?Gordy Clement RN Navigator Cardiac Imaging ?[facility] ?[phone] office ?[phone] cell ? ?Patient to take '100mg'$  metoprolol tartrate two hours prior to cardiac CT scan.  She is aware to arrive at 11:30am. ?

## 2022-02-08 ENCOUNTER — Ambulatory Visit (HOSPITAL_COMMUNITY)
Admission: RE | Admit: 2022-02-08 | Discharge: 2022-02-08 | Disposition: A | Discharge status: Home / Self Care | Payer: Medicare HMO | Source: Ambulatory Visit | Attending: Cardiology | Admitting: Cardiology

## 2022-02-08 DIAGNOSIS — I208 Other forms of angina pectoris: Secondary | ICD-10-CM | POA: Insufficient documentation

## 2022-02-08 DIAGNOSIS — I700 Atherosclerosis of aorta: Secondary | ICD-10-CM

## 2022-02-08 DIAGNOSIS — R0609 Other forms of dyspnea: Secondary | ICD-10-CM | POA: Insufficient documentation

## 2022-02-08 MED ORDER — NITROGLYCERIN 0.4 MG SL SUBL
SUBLINGUAL_TABLET | SUBLINGUAL | Status: AC
  Filled 2022-02-08: qty 2

## 2022-02-08 MED ORDER — NITROGLYCERIN 0.4 MG SL SUBL
0.8000 mg | SUBLINGUAL_TABLET | Freq: Once | SUBLINGUAL | Status: AC
  Administered 2022-02-08: 0.8 mg via SUBLINGUAL

## 2022-02-08 MED ORDER — IOHEXOL 350 MG/ML SOLN
100.0000 mL | Freq: Once | INTRAVENOUS | Status: AC | PRN
  Administered 2022-02-08: 100 mL via INTRAVENOUS

## 2022-02-12 ENCOUNTER — Telehealth: Payer: Self-pay

## 2022-02-12 DIAGNOSIS — I2510 Atherosclerotic heart disease of native coronary artery without angina pectoris: Secondary | ICD-10-CM

## 2022-02-12 DIAGNOSIS — R918 Other nonspecific abnormal finding of lung field: Secondary | ICD-10-CM

## 2022-02-12 MED ORDER — ROSUVASTATIN CALCIUM 20 MG PO TABS: 20.0000 mg | ORAL_TABLET | Freq: Every day | ORAL | 3 refills | Status: DC

## 2022-02-12 NOTE — Telephone Encounter (Signed)
I spoke with patient and discussed ct results with her.She agrees to switch from zocor to crestor and repeat lipids in 2 months at [facility]. I also placed referral to pulmonary.PCP copied ?

## 2022-02-12 NOTE — Telephone Encounter (Signed)
-----   Message from Shellia Cleverly, RN sent at 02/12/2022 11:01 AM EDT ----- ? ?----- Message ----- ?From: Darrell Jewel, RN ?Sent: 02/08/2022   5:06 PM EDT ?To: Shellia Cleverly, RN ? ? ?----- Message ----- ?From: Jerline Pain, MD ?Sent: 02/08/2022   4:05 PM EDT ?To: [facility] Triage ? ?Calcified coronary plaque.  Coronary calcium score 540 which was 94th percentile.  Moderate stenosis of LAD. ? ?Lets change the simvastatin 40 mg over to Crestor 20 mg once a day.  Repeat lipid panel in 2 months. ? ?I would like for her also to be referred to Eric Form, NP pulmonary clinic to monitor lung findings and to follow repeat CT scan requested. ? ?Candee Furbish, MD ? ? ? ?

## 2022-03-28 ENCOUNTER — Ambulatory Visit: Payer: Medicare HMO | Admitting: Pulmonary Disease

## 2022-03-28 ENCOUNTER — Encounter: Payer: Self-pay | Admitting: Pulmonary Disease

## 2022-03-28 VITALS — BP 110/70 | HR 62 | Temp 98.0°F | Ht 66.0 in | Wt 215.4 lb

## 2022-03-28 DIAGNOSIS — R0609 Other forms of dyspnea: Secondary | ICD-10-CM | POA: Diagnosis not present

## 2022-03-28 DIAGNOSIS — R918 Other nonspecific abnormal finding of lung field: Secondary | ICD-10-CM | POA: Diagnosis not present

## 2022-03-28 MED ORDER — ANORO ELLIPTA 62.5-25 MCG/ACT IN AEPB: 1.0000 | INHALATION_SPRAY | Freq: Every day | RESPIRATORY_TRACT | 3 refills | Status: DC

## 2022-03-28 NOTE — Progress Notes (Signed)
$'@Patient'Z$  ID: Julie Miles, female    DOB: [birth year], 71 y.o.   MRN: [id number]  Chief Complaint  Patient presents with   Consult    [facility] with exertion.   Going on for about 2 years.     Referring provider: Jerline Pain, MD  HPI:   71 y.o. whom are seen in consultation for evaluation of dyspnea on exertion and lung nodules.  Note from cardiology reviewed.  Patient reports longstanding history of dyspnea on exertion.  At least 2 years per her report.  Some suspicion of this has been going on longer.  She notes she retired about 8 years ago.  Had plan to continue to be active with exercise etc.  However this is slipped.  She is not very active.  Minimal exercise.  In addition she reports weight gain.  About 40 pounds over the last few years.  Dyspnea worse on inclines or stairs.  Present on flat surfaces well.  No time of day when things are better or worse.  No position make things better or worse.  No seasonal or environmental factors she can identify to make things better or worse.  Better with rest.  No other alleviating or exacerbating factors.  Does not use inhalers.  Reports 40-pack-year smoking history.  Reviewed CTA coronary that on my review interpretation shows no evidence of emphysema with limited evaluation of the upper portion of lung, scattered nodular groundglass opacities in the right lower lobe.  This had significant calcium score.  Her lipid medications were altered.  The findings on CT scan prompted referral.   PMH: Seasonal allergies, acute on chronic sinusitis, hyperlipidemia Surgical history: Cholecystectomy, appendectomy, tubal ligation Family history: Denies significant respiratory illness in first relatives Social history: Former smoker, quit 2006.  40-pack-year history.  Lives in [place], retired.   Questionaires / Pulmonary Flowsheets:   ACT:      No data to display          MMRC:     No data to display          Epworth:      No data  to display          Tests:   FENO:  No results found for: "NITRICOXIDE"  PFT:     No data to display          WALK:      No data to display          Imaging: Personally reviewed and as per EMR discussion this note No results found.  Lab Results: Personally reviewed CBC    Component Value Date/Time   WBC 10.5 03/14/2020 0259   RBC 4.00 03/14/2020 0259   HGB 12.1 03/14/2020 0259   HCT 37.4 03/14/2020 0259   PLT 258 03/14/2020 0259   MCV 93.5 03/14/2020 0259   MCH 30.3 03/14/2020 0259   MCHC 32.4 03/14/2020 0259   RDW 11.9 03/14/2020 0259   LYMPHSABS 1.6 01/31/2019 1457   MONOABS 1.1 (H) 01/31/2019 1457   EOSABS 0.1 01/31/2019 1457   BASOSABS 0.0 01/31/2019 1457    BMET    Component Value Date/Time   NA 140 01/24/2022 1414   K 4.0 01/24/2022 1414   CL 106 01/24/2022 1414   CO2 27 01/24/2022 1414   GLUCOSE 118 (H) 01/24/2022 1414   BUN 19 01/24/2022 1414   CREATININE 1.13 (H) 01/24/2022 1414   CALCIUM 8.8 (L) 01/24/2022 1414   GFRNONAA 53 (L) 01/24/2022 1414  GFRAA >60 03/14/2020 0259    BNP No results found for: "BNP"  ProBNP No results found for: "PROBNP"  Specialty Problems       Pulmonary Problems   Dyspnea on exertion    Allergies  Allergen Reactions   Codeine Nausea And Vomiting    Immunization History  Administered Date(s) Administered   Moderna Sars-Covid-2 Vaccination 10/05/2019, 11/02/2019    Past Medical History:  Diagnosis Date   Anxiety    History of blood transfusion 1975   at [facility] after vag delivery   History of kidney stones    Hypercholesterolemia    Hypothyroidism    MRSA (methicillin resistant Staphylococcus aureus) 03/08/2020   confirmed by PCR   PONV (postoperative nausea and vomiting)     Tobacco History: Social History   Tobacco Use  Smoking Status Former   Types: Cigarettes   Quit date: 2006   Years since quitting: 17.5  Smokeless Tobacco Never  Tobacco Comments   STATES  SHE DOES NOT SMOKE.   Counseling given: Not Answered Tobacco comments: STATES SHE DOES NOT SMOKE.   Continue to not smoke  Outpatient Encounter Medications as of 03/28/2022  Medication Sig   ALPRAZolam (XANAX) 1 MG tablet Take 1 mg by mouth at bedtime.    aspirin EC 81 MG tablet Take 1 tablet (81 mg total) by mouth 2 (two) times daily.   levothyroxine (SYNTHROID, LEVOTHROID) 50 MCG tablet Take 50 mcg by mouth daily before breakfast.   rosuvastatin (CRESTOR) 20 MG tablet Take 1 tablet (20 mg total) by mouth at bedtime.   umeclidinium-vilanterol (ANORO ELLIPTA) 62.5-25 MCG/ACT AEPB Inhale 1 puff into the lungs daily.   [DISCONTINUED] metoprolol tartrate (LOPRESSOR) 100 MG tablet Take 1 tablet (100 mg total) by mouth 2 (two) times daily. (Patient not taking: Reported on 03/28/2022)   No facility-administered encounter medications on file as of 03/28/2022.     Review of Systems  Review of Systems  No chest pain with exertion.  No orthopnea or PND.  Comprehensive review of systems otherwise negative. Physical Exam  BP 110/70   Pulse 62   Temp 98 F (36.7 C) (Oral)   Ht '5\' 6"'$  (1.676 m)   Wt 215 lb 6 oz (97.7 kg)   SpO2 97%   BMI 34.76 kg/m   Wt Readings from Last 5 Encounters:  03/28/22 215 lb 6 oz (97.7 kg)  01/24/22 217 lb (98.4 kg)  11/28/21 217 lb (98.4 kg)  06/06/20 210 lb (95.3 kg)  03/28/20 218 lb (98.9 kg)    BMI Readings from Last 5 Encounters:  03/28/22 34.76 kg/m  01/24/22 35.02 kg/m  11/28/21 35.02 kg/m  06/06/20 33.89 kg/m  03/28/20 35.19 kg/m     Physical Exam General: Well-appearing, no acute distress Eyes: EOMI, no icterus Neck: Supple, JVP Pulmonary: Distant, clear Cardiovascular warm, no edema Abdomen: Nondistended, bowel sounds present MSK: No synovitis, joint effusion Neuro: Normal gait, no weakness Psych: Normal mood, full affect   Assessment & Plan:   Dyspnea on exertion: Present for at least 2 years.  High suspicion for  deconditioning and weight gain contributing given description of less activity since retirement several years ago.  On exam, lung sounds distant.  Concern for additional contribution of smoking-related lung disease.  As such, prescribed Anoro 1 puff daily.  PFTs for further evaluation.  Lung nodules: Present in the past, wax and wane.  Most recently right lower lobe.  Multiple scheduled nodule opacity surrounding groundglass.  High suspicion for  infectious or inflammatory infiltrate.  Sinus infection with postnasal drip at the time.  High suspicion for aspiration of mucus.  Versus other inflammatory insult.  Repeat CT scan 3 months ordered for follow up per radiology recommendations, August 2023.   Return in about 2 months (around 05/28/2022).   Lanier Clam, MD 03/28/2022

## 2022-03-28 NOTE — Patient Instructions (Addendum)
Nice to see you  Use anoro 1 puff daily. This is to try to help with the shortness of breath.   We will get pulmonary function tests at the time of your next visit  We will get a CT of the chest in August prior to your visit to look at nodules that seem infectious or inflammatory   PFT same day as follow up with Dr. Silas Flood in 2 months (Do not schedule on August 25)

## 2022-04-05 ENCOUNTER — Telehealth: Payer: Self-pay | Admitting: Pulmonary Disease

## 2022-04-05 NOTE — Telephone Encounter (Signed)
Routing to Dr. Hunsucker as an FYI.  

## 2022-04-12 DIAGNOSIS — I2510 Atherosclerotic heart disease of native coronary artery without angina pectoris: Secondary | ICD-10-CM | POA: Diagnosis not present

## 2022-04-13 LAB — LIPID PANEL
Chol/HDL Ratio: 2.5 ratio (ref 0.0–4.4)
Cholesterol, Total: 154 mg/dL (ref 100–199)
HDL: 62 mg/dL (ref >39)
LDL Chol Calc (NIH): 67 mg/dL (ref 0–99)
Triglycerides: 150 mg/dL — ABNORMAL HIGH (ref 0–149)
VLDL Cholesterol Cal: 25 mg/dL (ref 5–40)

## 2022-05-21 ENCOUNTER — Other Ambulatory Visit (HOSPITAL_COMMUNITY): Payer: Medicare HMO

## 2022-05-30 DIAGNOSIS — L820 Inflamed seborrheic keratosis: Secondary | ICD-10-CM | POA: Diagnosis not present

## 2022-05-30 DIAGNOSIS — L570 Actinic keratosis: Secondary | ICD-10-CM | POA: Diagnosis not present

## 2022-05-30 DIAGNOSIS — L821 Other seborrheic keratosis: Secondary | ICD-10-CM | POA: Diagnosis not present

## 2022-05-30 DIAGNOSIS — D485 Neoplasm of uncertain behavior of skin: Secondary | ICD-10-CM | POA: Diagnosis not present

## 2022-05-31 ENCOUNTER — Encounter (HOSPITAL_COMMUNITY): Payer: Self-pay

## 2022-05-31 ENCOUNTER — Ambulatory Visit: Payer: Medicare HMO | Admitting: Orthopedic Surgery

## 2022-05-31 ENCOUNTER — Ambulatory Visit (HOSPITAL_COMMUNITY)
Admission: RE | Admit: 2022-05-31 | Discharge: 2022-05-31 | Disposition: A | Discharge status: Home / Self Care | Payer: Medicare HMO | Source: Ambulatory Visit | Attending: Pulmonary Disease | Admitting: Pulmonary Disease

## 2022-05-31 ENCOUNTER — Encounter: Payer: Self-pay | Admitting: Orthopedic Surgery

## 2022-05-31 VITALS — Ht 66.0 in | Wt 215.0 lb

## 2022-05-31 DIAGNOSIS — M1812 Unilateral primary osteoarthritis of first carpometacarpal joint, left hand: Secondary | ICD-10-CM

## 2022-05-31 DIAGNOSIS — J439 Emphysema, unspecified: Secondary | ICD-10-CM | POA: Diagnosis not present

## 2022-05-31 DIAGNOSIS — R918 Other nonspecific abnormal finding of lung field: Secondary | ICD-10-CM | POA: Diagnosis not present

## 2022-05-31 MED ORDER — METHYLPREDNISOLONE ACETATE 40 MG/ML IJ SUSP
40.0000 mg | Freq: Once | INTRAMUSCULAR | Status: AC
  Administered 2022-05-31: 40 mg via INTRA_ARTICULAR

## 2022-05-31 NOTE — Patient Instructions (Signed)

## 2022-05-31 NOTE — Progress Notes (Signed)
Return Patient Visit  Assessment: Julie Miles is a 71 y.o. RHD female with the following: 1. Arthritis of carpometacarpal (CMC) joint of left thumb   Plan: Prior injection was very effective.  Pain recently returned.  She would like to proceed with another injection.  This was completed in clinic today without issues.  She will contact the clinic if she needs anything else.   Procedure note injection - LeftThumb CMC joint   Verbal consent was obtained to inject the Left Thumb CMC joint Timeout was completed to confirm the site of injection.  The skin was prepped with alcohol and ethyl chloride was sprayed at the injection site.  A 21-gauge needle was used to inject 40 mg of Depo-Medrol and 1% lidocaine (1 cc) into the Left Thumb CMC joint using a direct anterior approach.  There were no complications. Patient tolerated the procedure well. A sterile bandage was applied     Follow-up: Return if symptoms worsen or fail to improve.  Subjective:  Chief Complaint  Patient presents with   Hand Pain    Lt thumb pain has been back for 1 mo.     History of Present Illness: Julie Miles is a 71 y.o. female who turns for evaluation of left hand pain.  She had pain at the base of the left thumb, when I saw her approximately 70 months ago.  We injected the first [facility] joint.  She had excellent resolution of her symptoms until about a month ago.  The pain is returned.  She would like to proceed with another injection.     Review of Systems: No fevers or chills No numbness or tingling No chest pain No shortness of breath No bowel or bladder dysfunction No GI distress No headaches   Objective: Ht '5\' 6"'$  (1.676 m)   Wt 215 lb (97.5 kg)   BMI 34.70 kg/m   Physical Exam:  General: Alert and oriented. and No acute distress. Gait: Normal gait.  Left hand without deformity.  No swelling is appreciated.  Tenderness to palpation at the base of the thumb.  Pain with CMC grind  test.  Negative Tinel's.  Fingers are warm and well-perfused.  Sensation is intact throughout the left hand.  IMAGING: I personally ordered and reviewed the following images  No new imaging obtained today.   New Medications:  Meds ordered this encounter  Medications   methylPREDNISolone acetate (DEPO-MEDROL) injection 40 mg      Mordecai Rasmussen, MD  05/31/2022 9:01 AM

## 2022-06-04 NOTE — Progress Notes (Signed)
CT scan demonstrates improvement. Can discuss further at office visit Friday.

## 2022-06-07 ENCOUNTER — Ambulatory Visit: Payer: Medicare HMO | Admitting: Pulmonary Disease

## 2022-06-07 ENCOUNTER — Encounter: Payer: Self-pay | Admitting: Pulmonary Disease

## 2022-06-07 ENCOUNTER — Ambulatory Visit (INDEPENDENT_AMBULATORY_CARE_PROVIDER_SITE_OTHER): Payer: Medicare HMO | Admitting: Pulmonary Disease

## 2022-06-07 VITALS — BP 128/66 | HR 75 | Temp 98.4°F | Ht 65.0 in | Wt 211.0 lb

## 2022-06-07 DIAGNOSIS — R0609 Other forms of dyspnea: Secondary | ICD-10-CM

## 2022-06-07 DIAGNOSIS — R911 Solitary pulmonary nodule: Secondary | ICD-10-CM | POA: Diagnosis not present

## 2022-06-07 LAB — PULMONARY FUNCTION TEST
DL/VA % pred: 113 %
DL/VA: 4.64 ml/min/mmHg/L
DLCO cor % pred: 97 %
DLCO cor: 19.66 ml/min/mmHg
DLCO unc % pred: 97 %
DLCO unc: 19.66 ml/min/mmHg
FEF 25-75 Post: 1.53 L/sec
FEF 25-75 Pre: 1.05 L/sec
FEF2575-%Change-Post: 45 %
FEF2575-%Pred-Post: 78 %
FEF2575-%Pred-Pre: 53 %
FEV1-%Change-Post: 13 %
FEV1-%Pred-Post: 72 %
FEV1-%Pred-Pre: 64 %
FEV1-Post: 1.72 L
FEV1-Pre: 1.53 L
FEV1FVC-%Change-Post: 6 %
FEV1FVC-%Pred-Pre: 91 %
FEV6-%Change-Post: 6 %
FEV6-%Pred-Post: 78 %
FEV6-%Pred-Pre: 73 %
FEV6-Post: 2.34 L
FEV6-Pre: 2.19 L
FEV6FVC-%Pred-Post: 104 %
FEV6FVC-%Pred-Pre: 104 %
FVC-%Change-Post: 6 %
FVC-%Pred-Post: 74 %
FVC-%Pred-Pre: 70 %
FVC-Post: 2.34 L
FVC-Pre: 2.19 L
Post FEV1/FVC ratio: 74 %
Post FEV6/FVC ratio: 100 %
Pre FEV1/FVC ratio: 70 %
Pre FEV6/FVC Ratio: 100 %
RV % pred: 115 %
RV: 2.58 L
TLC % pred: 95 %
TLC: 4.98 L

## 2022-06-07 MED ORDER — BEVESPI AEROSPHERE 9-4.8 MCG/ACT IN AERO: 2.0000 | INHALATION_SPRAY | Freq: Two times a day (BID) | RESPIRATORY_TRACT | 3 refills | Status: DC

## 2022-06-07 NOTE — Patient Instructions (Signed)
Nice to see you again  Your pulmonary function test are largely normal  It indicated your numbers improved with albuterol which makes me think an inhaler that helps dilate the airways would be helpful.  I am sorry the Anoro was unsatisfactory.  We discussed using Stiolto but it looks like based on what the computer is telling me the insurance will not cover this.  Use Bevespi 2 puffs twice a day.  Lets see if this helps with some of the shortness of breath.  Given the pulmonary function test are largely normal, I recommend contacting your cardiologist for follow-up for further evaluation of your shortness of breath.  Return to clinic in 3 months or sooner as needed with Dr. Silas Flood

## 2022-06-07 NOTE — Patient Instructions (Signed)
Full PFT Performed Today  

## 2022-06-07 NOTE — Progress Notes (Signed)
Full PFT Performed Today  

## 2022-06-07 NOTE — Progress Notes (Signed)
$'@Patient'o$  ID: Julie Miles, female    DOB: [birth year], 71 y.o.   MRN: [id number]  Chief Complaint  Patient presents with   Follow-up    Pt is here for follow up for DOE. Pt states that she did not like the Anoro due to the powder. Pt had full PFTs done today     Referring provider: Sharilyn Sites, MD  HPI:   71 y.o. whom are seen in follow up for evaluation of dyspnea on exertion and lung nodules.     She returns for routine follow-up.  Emphysema seen on CT scan.  Prescribed Anoro at last visit.  She not like this.  Did not like the way the powder fell.  Is not using.  She had pulmonary function test performed today prior to visit.  This demonstrates largely normal PFTs with significant bronchodilator response 200%, 13% increase in FEV1.  Normal DLCO.  Lung volumes within normal limits although near threshold for air trapping.  There is overall good news.  Discussed that her lungs are healthy.  I think this is a small contributor to her symptoms.  We will try different medicines, bronchodilator to see if this helps improve symptoms.  Encouraged her to discuss with her cardiologist given significant calcification seen on CT coronary scan a few months ago.  For like there is another driver of her dyspnea.  Please see below for assessment and plan.  HPI at initial visit: Patient reports longstanding history of dyspnea on exertion.  At least 2 years per her report.  Some suspicion of this has been going on longer.  She notes she retired about 8 years ago.  Had plan to continue to be active with exercise etc.  However this is slipped.  She is not very active.  Minimal exercise.  In addition she reports weight gain.  About 40 pounds over the last few years.  Dyspnea worse on inclines or stairs.  Present on flat surfaces well.  No time of day when things are better or worse.  No position make things better or worse.  No seasonal or environmental factors she can identify to make things better or  worse.  Better with rest.  No other alleviating or exacerbating factors.  Does not use inhalers.  Reports 40-pack-year smoking history.  Reviewed CTA coronary that on my review interpretation shows no evidence of emphysema with limited evaluation of the upper portion of lung, scattered nodular groundglass opacities in the right lower lobe.  This had significant calcium score.  Her lipid medications were altered.  The findings on CT scan prompted referral.   PMH: Seasonal allergies, acute on chronic sinusitis, hyperlipidemia Surgical history: Cholecystectomy, appendectomy, tubal ligation Family history: Denies significant respiratory illness in first relatives Social history: Former smoker, quit 2006.  40-pack-year history.  Lives in [place], retired.   Questionaires / Pulmonary Flowsheets:   ACT:      No data to display          MMRC:     No data to display          Epworth:      No data to display          Tests:   FENO:  No results found for: "NITRICOXIDE"  PFT:    Latest Ref Rng & Units 06/07/2022    8:44 AM  PFT Results  FVC-Pre L 2.19  P  FVC-Predicted Pre % 70  P  FVC-Post L 2.34  P  FVC-Predicted Post %  74  P  Pre FEV1/FVC % % 70  P  Post FEV1/FCV % % 74  P  FEV1-Pre L 1.53  P  FEV1-Predicted Pre % 64  P  FEV1-Post L 1.72  P  DLCO uncorrected ml/min/mmHg 19.66  P  DLCO UNC% % 97  P  DLCO corrected ml/min/mmHg 19.66  P  DLCO COR %Predicted % 97  P  DLVA Predicted % 113  P  TLC L 4.98  P  TLC % Predicted % 95  P  RV % Predicted % 115  P    P Preliminary result  Personally reviewed and interpreted as spirometry just of mild restriction versus air trapping.  Significant bronchodilator response in FEV1.  Lung volumes within normal limits.  DLCO within normal limits.  Essentially normal PFTs.  WALK:      No data to display          Imaging: Personally reviewed and as per EMR discussion this note CT Chest Wo Contrast  Result Date:  05/31/2022 CLINICAL DATA:  Follow-up bilateral pulmonary nodules. EXAM: CT CHEST WITHOUT CONTRAST TECHNIQUE: Multidetector CT imaging of the chest was performed following the standard protocol without IV contrast. RADIATION DOSE REDUCTION: This exam was performed according to the departmental dose-optimization program which includes automated exposure control, adjustment of the mA and/or kV according to patient size and/or use of iterative reconstruction technique. COMPARISON:  Cardiac CT 02/08/2022 FINDINGS: Cardiovascular: Normal caliber thoracic aorta, mild atherosclerosis. Heart is normal in size. Coronary artery calcifications, assessed on prior CT. No pericardial effusion. Mediastinum/Nodes: No enlarged mediastinal lymph nodes. No obvious bulky hilar adenopathy, not well assessed on this unenhanced exam. The esophagus is decompressed. No suspicious thyroid nodule. Lungs/Pleura: The previous ground-glass and patchy airspace disease in the right lower lobe on prior exam has essentially resolved. There may be minimal residual subpleural ground-glass and reticulation in the lower most lateral right lower lobe. There are 2 separate 5 mm nodules in the right lower lobe, series 4, image 114 and series 4, image 108. These were partially calcified on prior exam, calcifications not well demonstrated on the current exam due to slice selection. There multiple tiny 2-3 mm nodules in the right lower lobe, previously obscured. There are few tiny 2-3 mm right and left upper lobe noncalcified nodules. Mild apical emphysema. There is mild central bronchial thickening. No pleural effusion. Upper Abdomen: No acute upper abdominal findings.  Cholecystectomy. Musculoskeletal: There are no acute or suspicious osseous abnormalities. IMPRESSION: 1. The previous ground-glass and patchy airspace disease in the right lower lobe on has essentially resolved. There may be minimal residual subpleural ground-glass and reticulation in the lower  most lateral right lower lobe, favored post infectious or inflammatory scarring. 2. There are 2 separate 5 mm nodules in the right lower lobe. These were partially calcified on prior exam, calcifications not well demonstrated on the current exam due to slice selection. This is consistent with benign granuloma, and these specific nodules need no further follow-up. 3. There are multiple tiny 2-3 mm nodules in the right lower and right and left upper lobe. Per Fleischner Society Guidelines, a non-contrast Chest CT at 12 months is optional. If performed and the nodule is stable at 12 months, no further follow-up is recommended. These guidelines do not apply to immunocompromised patients and patients with cancer. Follow up in patients with significant comorbidities as clinically warranted. For lung cancer screening, adhere to Lung-RADS guidelines. Reference: Radiology. 2017; 284(1):228-43. 4. Aortic atherosclerosis.  Coronary artery calcifications. Aortic  Atherosclerosis (ICD10-I70.0) and Emphysema (ICD10-J43.9). Electronically Signed   By: Keith Rake M.D.   On: 05/31/2022 21:52    Lab Results: Personally reviewed CBC    Component Value Date/Time   WBC 10.5 03/14/2020 0259   RBC 4.00 03/14/2020 0259   HGB 12.1 03/14/2020 0259   HCT 37.4 03/14/2020 0259   PLT 258 03/14/2020 0259   MCV 93.5 03/14/2020 0259   MCH 30.3 03/14/2020 0259   MCHC 32.4 03/14/2020 0259   RDW 11.9 03/14/2020 0259   LYMPHSABS 1.6 01/31/2019 1457   MONOABS 1.1 (H) 01/31/2019 1457   EOSABS 0.1 01/31/2019 1457   BASOSABS 0.0 01/31/2019 1457    BMET    Component Value Date/Time   NA 140 01/24/2022 1414   K 4.0 01/24/2022 1414   CL 106 01/24/2022 1414   CO2 27 01/24/2022 1414   GLUCOSE 118 (H) 01/24/2022 1414   BUN 19 01/24/2022 1414   CREATININE 1.13 (H) 01/24/2022 1414   CALCIUM 8.8 (L) 01/24/2022 1414   GFRNONAA 53 (L) 01/24/2022 1414   GFRAA >60 03/14/2020 0259    BNP No results found for:  "BNP"  ProBNP No results found for: "PROBNP"  Specialty Problems       Pulmonary Problems   Dyspnea on exertion    Allergies  Allergen Reactions   Codeine Nausea And Vomiting    Immunization History  Administered Date(s) Administered   Moderna Sars-Covid-2 Vaccination 10/05/2019, 11/02/2019    Past Medical History:  Diagnosis Date   Anxiety    History of blood transfusion [facility]   [facility] after vag delivery   History of kidney stones    Hypercholesterolemia    Hypothyroidism    MRSA (methicillin resistant Staphylococcus aureus) 03/08/2020   confirmed by PCR   PONV (postoperative nausea and vomiting)     Tobacco History: Social History   Tobacco Use  Smoking Status Former   Types: Cigarettes   Quit date: 2006   Years since quitting: 17.6  Smokeless Tobacco Never  Tobacco Comments   STATES SHE DOES NOT SMOKE.   Counseling given: Not Answered Tobacco comments: STATES SHE DOES NOT SMOKE.   Continue to not smoke  Outpatient Encounter Medications as of 06/07/2022  Medication Sig   ALPRAZolam (XANAX) 1 MG tablet Take 1 mg by mouth at bedtime.    aspirin EC 81 MG tablet Take 1 tablet (81 mg total) by mouth 2 (two) times daily.   Glycopyrrolate-Formoterol (BEVESPI AEROSPHERE) 9-4.8 MCG/ACT AERO Inhale 2 puffs into the lungs 2 (two) times daily.   levothyroxine (SYNTHROID, LEVOTHROID) 50 MCG tablet Take 50 mcg by mouth daily before breakfast.   rosuvastatin (CRESTOR) 20 MG tablet Take 1 tablet (20 mg total) by mouth at bedtime.   [DISCONTINUED] umeclidinium-vilanterol (ANORO ELLIPTA) 62.5-25 MCG/ACT AEPB Inhale 1 puff into the lungs daily.   No facility-administered encounter medications on file as of 06/07/2022.     Review of Systems  Review of Systems  N/a Physical Exam  BP 128/66 (BP Location: Left Arm, Patient Position: Sitting, Cuff Size: Normal)   Pulse 75   Temp 98.4 F (36.9 C) (Oral)   Ht '5\' 5"'$  (1.651 m)   Wt 211 lb (95.7 kg)    SpO2 95%   BMI 35.11 kg/m   Wt Readings from Last 5 Encounters:  06/07/22 211 lb (95.7 kg)  05/31/22 215 lb (97.5 kg)  03/28/22 215 lb 6 oz (97.7 kg)  01/24/22 217 lb (98.4 kg)  11/28/21 217 lb (  98.4 kg)    BMI Readings from Last 5 Encounters:  06/07/22 35.11 kg/m  05/31/22 34.70 kg/m  03/28/22 34.76 kg/m  01/24/22 35.02 kg/m  11/28/21 35.02 kg/m     Physical Exam General: Well-appearing, no acute distress Eyes: EOMI, no icterus Neck: Supple, JVP Pulmonary: Distant, clear Cardiovascular warm, no edema Abdomen: Nondistended, bowel sounds present MSK: No synovitis, joint effusion Neuro: Normal gait, no weakness Psych: Normal mood, full affect   Assessment & Plan:   Dyspnea on exertion: Present for at least years.  High suspicion for deconditioning and weight gain contributing given description of less activity since retirement several years ago.  PFTs with significant bronchodilator response.  No fixed obstruction.  Borderline but does not meet threshold for air trapping.  She did not like the way Anoro tasted or felt in her mouth.  Prescribed Bevespi 2 puff twice daily.  Strongly encourage cardiology follow-up given essentially normal PFTs and ongoing dyspnea in the setting of abnormal coronary CT scan.  Lung nodules: Present in the past, wax and wane.  Repeat CT scan 04/2022 with resolution of prior right lower lobe with new scattered 2 to 3 mm left upper lobe and right upper lobe nodules, calcified nodule stable.  Repeat CT scan 05/2023 for 1 year follow-up per Fleischner criteria.  Return in about 3 months (around 09/06/2022).   Lanier Clam, MD 06/07/2022

## 2022-07-05 DIAGNOSIS — E782 Mixed hyperlipidemia: Secondary | ICD-10-CM | POA: Diagnosis not present

## 2022-07-05 DIAGNOSIS — E559 Vitamin D deficiency, unspecified: Secondary | ICD-10-CM | POA: Diagnosis not present

## 2022-07-05 DIAGNOSIS — E039 Hypothyroidism, unspecified: Secondary | ICD-10-CM | POA: Diagnosis not present

## 2022-07-05 DIAGNOSIS — E7849 Other hyperlipidemia: Secondary | ICD-10-CM | POA: Diagnosis not present

## 2022-07-05 DIAGNOSIS — E6609 Other obesity due to excess calories: Secondary | ICD-10-CM | POA: Diagnosis not present

## 2022-07-05 DIAGNOSIS — R7309 Other abnormal glucose: Secondary | ICD-10-CM | POA: Diagnosis not present

## 2022-07-05 DIAGNOSIS — Z23 Encounter for immunization: Secondary | ICD-10-CM | POA: Diagnosis not present

## 2022-08-09 DIAGNOSIS — E6609 Other obesity due to excess calories: Secondary | ICD-10-CM | POA: Diagnosis not present

## 2022-08-09 DIAGNOSIS — M1711 Unilateral primary osteoarthritis, right knee: Secondary | ICD-10-CM | POA: Diagnosis not present

## 2022-08-09 DIAGNOSIS — Z6836 Body mass index (BMI) 36.0-36.9, adult: Secondary | ICD-10-CM | POA: Diagnosis not present

## 2022-08-15 ENCOUNTER — Ambulatory Visit: Payer: Medicare HMO | Admitting: Orthopaedic Surgery

## 2022-09-01 DIAGNOSIS — J029 Acute pharyngitis, unspecified: Secondary | ICD-10-CM | POA: Diagnosis not present

## 2022-09-01 DIAGNOSIS — R059 Cough, unspecified: Secondary | ICD-10-CM | POA: Diagnosis not present

## 2022-09-01 DIAGNOSIS — R5383 Other fatigue: Secondary | ICD-10-CM | POA: Diagnosis not present

## 2022-09-01 DIAGNOSIS — Z20822 Contact with and (suspected) exposure to covid-19: Secondary | ICD-10-CM | POA: Diagnosis not present

## 2022-09-02 DIAGNOSIS — J029 Acute pharyngitis, unspecified: Secondary | ICD-10-CM | POA: Diagnosis not present

## 2022-09-02 DIAGNOSIS — Z20822 Contact with and (suspected) exposure to covid-19: Secondary | ICD-10-CM | POA: Diagnosis not present

## 2022-09-02 DIAGNOSIS — R5383 Other fatigue: Secondary | ICD-10-CM | POA: Diagnosis not present

## 2022-09-02 DIAGNOSIS — R059 Cough, unspecified: Secondary | ICD-10-CM | POA: Diagnosis not present

## 2022-09-05 DIAGNOSIS — Z20822 Contact with and (suspected) exposure to covid-19: Secondary | ICD-10-CM | POA: Diagnosis not present

## 2022-09-05 DIAGNOSIS — R5383 Other fatigue: Secondary | ICD-10-CM | POA: Diagnosis not present

## 2022-09-05 DIAGNOSIS — R059 Cough, unspecified: Secondary | ICD-10-CM | POA: Diagnosis not present

## 2022-09-05 DIAGNOSIS — J029 Acute pharyngitis, unspecified: Secondary | ICD-10-CM | POA: Diagnosis not present

## 2022-09-06 DIAGNOSIS — J029 Acute pharyngitis, unspecified: Secondary | ICD-10-CM | POA: Diagnosis not present

## 2022-09-06 DIAGNOSIS — Z20822 Contact with and (suspected) exposure to covid-19: Secondary | ICD-10-CM | POA: Diagnosis not present

## 2022-09-06 DIAGNOSIS — R059 Cough, unspecified: Secondary | ICD-10-CM | POA: Diagnosis not present

## 2022-09-06 DIAGNOSIS — R5383 Other fatigue: Secondary | ICD-10-CM | POA: Diagnosis not present

## 2022-09-09 DIAGNOSIS — R5383 Other fatigue: Secondary | ICD-10-CM | POA: Diagnosis not present

## 2022-09-09 DIAGNOSIS — J029 Acute pharyngitis, unspecified: Secondary | ICD-10-CM | POA: Diagnosis not present

## 2022-09-09 DIAGNOSIS — Z20822 Contact with and (suspected) exposure to covid-19: Secondary | ICD-10-CM | POA: Diagnosis not present

## 2022-09-09 DIAGNOSIS — R059 Cough, unspecified: Secondary | ICD-10-CM | POA: Diagnosis not present

## 2022-09-10 DIAGNOSIS — Z20822 Contact with and (suspected) exposure to covid-19: Secondary | ICD-10-CM | POA: Diagnosis not present

## 2022-09-10 DIAGNOSIS — R059 Cough, unspecified: Secondary | ICD-10-CM | POA: Diagnosis not present

## 2022-09-10 DIAGNOSIS — R5383 Other fatigue: Secondary | ICD-10-CM | POA: Diagnosis not present

## 2022-09-10 DIAGNOSIS — J029 Acute pharyngitis, unspecified: Secondary | ICD-10-CM | POA: Diagnosis not present

## 2022-09-13 DIAGNOSIS — Z20822 Contact with and (suspected) exposure to covid-19: Secondary | ICD-10-CM | POA: Diagnosis not present

## 2022-09-13 DIAGNOSIS — J029 Acute pharyngitis, unspecified: Secondary | ICD-10-CM | POA: Diagnosis not present

## 2022-09-13 DIAGNOSIS — R5383 Other fatigue: Secondary | ICD-10-CM | POA: Diagnosis not present

## 2022-09-13 DIAGNOSIS — R059 Cough, unspecified: Secondary | ICD-10-CM | POA: Diagnosis not present

## 2022-09-14 DIAGNOSIS — R5383 Other fatigue: Secondary | ICD-10-CM | POA: Diagnosis not present

## 2022-09-14 DIAGNOSIS — J029 Acute pharyngitis, unspecified: Secondary | ICD-10-CM | POA: Diagnosis not present

## 2022-09-14 DIAGNOSIS — R059 Cough, unspecified: Secondary | ICD-10-CM | POA: Diagnosis not present

## 2022-09-14 DIAGNOSIS — Z20822 Contact with and (suspected) exposure to covid-19: Secondary | ICD-10-CM | POA: Diagnosis not present

## 2022-09-28 DIAGNOSIS — R059 Cough, unspecified: Secondary | ICD-10-CM | POA: Diagnosis not present

## 2022-09-28 DIAGNOSIS — J029 Acute pharyngitis, unspecified: Secondary | ICD-10-CM | POA: Diagnosis not present

## 2022-09-28 DIAGNOSIS — R5383 Other fatigue: Secondary | ICD-10-CM | POA: Diagnosis not present

## 2022-09-28 DIAGNOSIS — Z20822 Contact with and (suspected) exposure to covid-19: Secondary | ICD-10-CM | POA: Diagnosis not present

## 2022-09-30 DIAGNOSIS — Z20822 Contact with and (suspected) exposure to covid-19: Secondary | ICD-10-CM | POA: Diagnosis not present

## 2022-09-30 DIAGNOSIS — R059 Cough, unspecified: Secondary | ICD-10-CM | POA: Diagnosis not present

## 2022-09-30 DIAGNOSIS — J029 Acute pharyngitis, unspecified: Secondary | ICD-10-CM | POA: Diagnosis not present

## 2022-09-30 DIAGNOSIS — R5383 Other fatigue: Secondary | ICD-10-CM | POA: Diagnosis not present

## 2022-10-04 DIAGNOSIS — H524 Presbyopia: Secondary | ICD-10-CM | POA: Diagnosis not present

## 2022-10-05 DIAGNOSIS — Z0100 Encounter for examination of eyes and vision without abnormal findings: Secondary | ICD-10-CM | POA: Diagnosis not present

## 2022-10-31 DIAGNOSIS — E782 Mixed hyperlipidemia: Secondary | ICD-10-CM | POA: Diagnosis not present

## 2022-10-31 DIAGNOSIS — M1711 Unilateral primary osteoarthritis, right knee: Secondary | ICD-10-CM | POA: Diagnosis not present

## 2022-10-31 DIAGNOSIS — E119 Type 2 diabetes mellitus without complications: Secondary | ICD-10-CM | POA: Diagnosis not present

## 2022-10-31 DIAGNOSIS — Z0000 Encounter for general adult medical examination without abnormal findings: Secondary | ICD-10-CM | POA: Diagnosis not present

## 2022-10-31 DIAGNOSIS — E6609 Other obesity due to excess calories: Secondary | ICD-10-CM | POA: Diagnosis not present

## 2022-10-31 DIAGNOSIS — Z1331 Encounter for screening for depression: Secondary | ICD-10-CM | POA: Diagnosis not present

## 2022-10-31 DIAGNOSIS — E039 Hypothyroidism, unspecified: Secondary | ICD-10-CM | POA: Diagnosis not present

## 2022-10-31 DIAGNOSIS — I700 Atherosclerosis of aorta: Secondary | ICD-10-CM | POA: Diagnosis not present

## 2022-10-31 DIAGNOSIS — Z6836 Body mass index (BMI) 36.0-36.9, adult: Secondary | ICD-10-CM | POA: Diagnosis not present

## 2022-10-31 DIAGNOSIS — R7309 Other abnormal glucose: Secondary | ICD-10-CM | POA: Diagnosis not present

## 2023-01-10 ENCOUNTER — Ambulatory Visit (INDEPENDENT_AMBULATORY_CARE_PROVIDER_SITE_OTHER): Payer: Medicare HMO | Admitting: Orthopedic Surgery

## 2023-01-10 ENCOUNTER — Encounter: Payer: Self-pay | Admitting: Orthopedic Surgery

## 2023-01-10 DIAGNOSIS — M1812 Unilateral primary osteoarthritis of first carpometacarpal joint, left hand: Secondary | ICD-10-CM

## 2023-01-10 MED ORDER — METHYLPREDNISOLONE ACETATE 40 MG/ML IJ SUSP
40.0000 mg | Freq: Once | INTRAMUSCULAR | Status: AC
  Administered 2023-01-10: 20 mg via INTRA_ARTICULAR

## 2023-01-10 NOTE — Progress Notes (Signed)
Return Patient Visit  Assessment: Julie Miles is a 72 y.o. RHD female with the following: 1. Arthritis of carpometacarpal [facility]) joint of left thumb   Plan: Julie Miles continues to have left thumb pain.  Most recent injection was helpful for several months, but not quite as helpful as the first injection she received.  She would like to proceed with another injection.  We also discussed trying a brace on her thumb to provide some additional stability.  She can purchase one on her own.   Procedure note injection - LeftThumb CMC joint   Verbal consent was obtained to inject the Left Thumb CMC joint Timeout was completed to confirm the site of injection.  The skin was prepped with alcohol and ethyl chloride was sprayed at the injection site.  A 21-gauge needle was used to inject 40 mg of Depo-Medrol and 1% lidocaine (1 cc) into the Left Thumb CMC joint using a direct anterior approach.  There were no complications. Patient tolerated the procedure well. A sterile bandage was applied     Follow-up: No follow-ups on file.  Subjective:  Chief Complaint  Patient presents with   Injections    Left thumb     History of Present Illness: Julie Miles is a 72 y.o. female who turns for evaluation of left hand pain.  I have seen her multiple times for pain at the base of her left thumb.  She has had 2 prior injections.  The most recent injection was helpful, but not as effective as the first injection.  She is now having more pain in the musculature versus the joint.  She has not tried a brace.  She is not taking medicines on a consistent basis.    Review of Systems: No fevers or chills No numbness or tingling No chest pain No shortness of breath No bowel or bladder dysfunction No GI distress No headaches   Objective: There were no vitals taken for this visit.  Physical Exam:  General: Alert and oriented. and No acute distress. Gait: Normal gait.  Left hand without  deformity.  No swelling is appreciated.  Tenderness to palpation at the base of the thumb.  Pain with CMC grind test.  Negative Tinel's.  Fingers are warm and well-perfused.  Sensation is intact throughout the left hand.  IMAGING: I personally ordered and reviewed the following images  No new imaging obtained today.   New Medications:  No orders of the defined types were placed in this encounter.     Oliver Barre, MD  01/10/2023 9:04 AM

## 2023-01-10 NOTE — Patient Instructions (Signed)
Instructions Following Joint Injections  In clinic today, you received an injection in one of your joints (sometimes more than one).  Occasionally, you can have some pain at the injection site, this is normal.  You can place ice at the injection site, or take over-the-counter medications such as Tylenol (acetaminophen) or Advil (ibuprofen).  Please follow all directions listed on the bottle.  If your joint (knee or shoulder) becomes swollen, red or very painful, please contact the clinic for additional assistance.   Two medications were injected, including lidocaine and a steroid (often referred to as cortisone).  Lidocaine is effective almost immediately but wears off quickly.  However, the steroid can take a few days to improve your symptoms.  In some cases, it can make your pain worse for a couple of days.  Do not be concerned if this happens as it is common.  You can apply ice or take some over-the-counter medications as needed.    Brace as needed.

## 2023-01-18 ENCOUNTER — Other Ambulatory Visit: Payer: Self-pay

## 2023-01-18 ENCOUNTER — Encounter: Payer: Self-pay | Admitting: Emergency Medicine

## 2023-01-18 ENCOUNTER — Ambulatory Visit
Admission: EM | Admit: 2023-01-18 | Discharge: 2023-01-18 | Disposition: A | Discharge status: Home / Self Care | Payer: Medicare HMO | Attending: Family Medicine | Admitting: Family Medicine

## 2023-01-18 DIAGNOSIS — N3090 Cystitis, unspecified without hematuria: Secondary | ICD-10-CM | POA: Insufficient documentation

## 2023-01-18 LAB — POCT URINALYSIS DIP (MANUAL ENTRY)
Glucose, UA: NEGATIVE mg/dL
Nitrite, UA: NEGATIVE
Spec Grav, UA: >=1.03 — AB (ref 1.010–1.025)
Urobilinogen, UA: 0.2 E.U./dL
pH, UA: 5 (ref 5.0–8.0)

## 2023-01-18 MED ORDER — CEPHALEXIN 500 MG PO CAPS: 500.0000 mg | ORAL_CAPSULE | Freq: Two times a day (BID) | ORAL | 0 refills | Status: DC

## 2023-01-18 NOTE — ED Triage Notes (Signed)
Pt reports urinary frequency, abdominal pressure, decreased urine output since this am. Reports history of similar.

## 2023-01-18 NOTE — Discharge Instructions (Signed)
You have had labs (urine culture) sent today. We will call you with any significant abnormalities or if there is need to begin or change treatment or pursue further follow up.  You may also review your test results online through MyChart. If you do not have a MyChart account, instructions to sign up should be on your discharge paperwork.  

## 2023-01-19 LAB — URINE CULTURE: Culture: NO GROWTH

## 2023-01-20 NOTE — ED Provider Notes (Signed)
[facility]    ASSESSMENT & PLAN:  1. Cystitis    Begin: Meds ordered this encounter  Medications   cephALEXin (KEFLEX) 500 MG capsule    Sig: Take 1 capsule (500 mg total) by mouth 2 (two) times daily.    Dispense:  10 capsule    Refill:  0   No signs of pyelonephritis. Urine culture sent. Will notify patient of any significant results. Will follow up with her PCP or here if not showing improvement over the next 48 hours, sooner if needed.  Outlined signs and symptoms indicating need for more acute intervention. Patient verbalized understanding. After Visit Summary given.  SUBJECTIVE:  Julie Miles is a 72 y.o. female who complains of urinary frequency, urgency and dysuria for the past 8-12 hours. Without associated flank pain, fever, chills, vaginal discharge or bleeding. Gross hematuria: not present. No specific aggravating or alleviating factors reported. No LE edema. Normal PO intake without n/v/d. Without specific abdominal pain. Ambulatory without difficulty. No tx PTA. H/O UTI with similar symptoms.  LMP: No LMP recorded. Patient has had a hysterectomy.  OBJECTIVE:  Vitals:   01/18/23 1523  BP: (!) 145/81  Pulse: 89  Resp: 16  Temp: 98 F (36.7 C)  TempSrc: Oral  SpO2: 93%   General appearance: alert; no distress HENT: oropharynx: moist Lungs: unlabored respirations Abdomen: soft, non-tender Back: no CVA tenderness Extremities: no edema; symmetrical with no gross deformities Skin: warm and dry Neurologic: normal gait Psychological: alert and cooperative; normal mood and affect  Labs Reviewed  POCT URINALYSIS DIP (MANUAL ENTRY) - Abnormal; Notable for the following components:      Result Value   Bilirubin, UA small (*)    Ketones, POC UA trace (5) (*)    Spec Grav, UA >=1.030 (*)    Blood, UA moderate (*)    Protein Ur, POC trace (*)    Leukocytes, UA Small (1+) (*)    All other components within normal limits  URINE CULTURE     Allergies  Allergen Reactions   Codeine Nausea And Vomiting    Past Medical History:  Diagnosis Date   Anxiety    History of blood transfusion 1975   at [facility] after vag delivery   History of kidney stones    Hypercholesterolemia    Hypothyroidism    MRSA (methicillin resistant Staphylococcus aureus) 03/08/2020   confirmed by PCR   PONV (postoperative nausea and vomiting)    Social History   Socioeconomic History   Marital status: Divorced    Spouse name: Not on file   Number of children: Not on file   Years of education: Not on file   Highest education level: Not on file  Occupational History   Not on file  Tobacco Use   Smoking status: Former    Types: Cigarettes    Quit date: 2006    Years since quitting: 18.3   Smokeless tobacco: Never   Tobacco comments:    STATES SHE DOES NOT SMOKE.  Vaping Use   Vaping Use: Never used  Substance and Sexual Activity   Alcohol use: No   Drug use: No   Sexual activity: Not on file  Other Topics Concern   Not on file  Social History Narrative   Not on file   Social Determinants of Health   Financial Resource Strain: Not on file  Food Insecurity: Not on file  Transportation Needs: Not on file  Physical Activity: Not on file  Stress: Not on file  Social Connections: Not on file  Intimate Partner Violence: Not on file   Family History  Problem Relation Age of Onset   Heart disease Mother    Diabetes Mother         Mardella Layman, MD 01/20/23 [phone]

## 2023-05-01 DIAGNOSIS — E039 Hypothyroidism, unspecified: Secondary | ICD-10-CM | POA: Diagnosis not present

## 2023-05-01 DIAGNOSIS — E6609 Other obesity due to excess calories: Secondary | ICD-10-CM | POA: Diagnosis not present

## 2023-05-01 DIAGNOSIS — Z6836 Body mass index (BMI) 36.0-36.9, adult: Secondary | ICD-10-CM | POA: Diagnosis not present

## 2023-05-21 DIAGNOSIS — L570 Actinic keratosis: Secondary | ICD-10-CM | POA: Diagnosis not present

## 2023-06-06 ENCOUNTER — Emergency Department (HOSPITAL_COMMUNITY): Payer: Medicare HMO

## 2023-06-06 ENCOUNTER — Emergency Department (HOSPITAL_COMMUNITY)
Admission: EM | Admit: 2023-06-06 | Discharge: 2023-06-06 | Disposition: A | Discharge status: Home / Self Care | Payer: Medicare HMO | Attending: Emergency Medicine | Admitting: Emergency Medicine

## 2023-06-06 ENCOUNTER — Other Ambulatory Visit: Payer: Self-pay

## 2023-06-06 ENCOUNTER — Encounter (HOSPITAL_COMMUNITY): Payer: Self-pay | Admitting: Emergency Medicine

## 2023-06-06 DIAGNOSIS — N201 Calculus of ureter: Secondary | ICD-10-CM | POA: Diagnosis not present

## 2023-06-06 DIAGNOSIS — R339 Retention of urine, unspecified: Secondary | ICD-10-CM | POA: Diagnosis not present

## 2023-06-06 DIAGNOSIS — Z7982 Long term (current) use of aspirin: Secondary | ICD-10-CM | POA: Insufficient documentation

## 2023-06-06 DIAGNOSIS — E876 Hypokalemia: Secondary | ICD-10-CM | POA: Diagnosis not present

## 2023-06-06 DIAGNOSIS — R7309 Other abnormal glucose: Secondary | ICD-10-CM

## 2023-06-06 DIAGNOSIS — N132 Hydronephrosis with renal and ureteral calculous obstruction: Secondary | ICD-10-CM | POA: Diagnosis not present

## 2023-06-06 DIAGNOSIS — N23 Unspecified renal colic: Secondary | ICD-10-CM | POA: Insufficient documentation

## 2023-06-06 DIAGNOSIS — D3501 Benign neoplasm of right adrenal gland: Secondary | ICD-10-CM | POA: Diagnosis not present

## 2023-06-06 DIAGNOSIS — R739 Hyperglycemia, unspecified: Secondary | ICD-10-CM | POA: Insufficient documentation

## 2023-06-06 DIAGNOSIS — K5730 Diverticulosis of large intestine without perforation or abscess without bleeding: Secondary | ICD-10-CM | POA: Diagnosis not present

## 2023-06-06 LAB — CBC WITH DIFFERENTIAL/PLATELET
Abs Immature Granulocytes: 0.08 10*3/uL — ABNORMAL HIGH (ref 0.00–0.07)
Basophils Absolute: 0.1 10*3/uL (ref 0.0–0.1)
Basophils Relative: 1 %
Eosinophils Absolute: 0.1 10*3/uL (ref 0.0–0.5)
Eosinophils Relative: 1 %
HCT: 43.4 % (ref 36.0–46.0)
Hemoglobin: 14.2 g/dL (ref 12.0–15.0)
Immature Granulocytes: 1 %
Lymphocytes Relative: 24 %
Lymphs Abs: 2.4 10*3/uL (ref 0.7–4.0)
MCH: 30 pg (ref 26.0–34.0)
MCHC: 32.7 g/dL (ref 30.0–36.0)
MCV: 91.6 fL (ref 80.0–100.0)
Monocytes Absolute: 1 10*3/uL (ref 0.1–1.0)
Monocytes Relative: 10 %
Neutro Abs: 6.4 10*3/uL (ref 1.7–7.7)
Neutrophils Relative %: 63 %
Platelets: 328 10*3/uL (ref 150–400)
RBC: 4.74 MIL/uL (ref 3.87–5.11)
RDW: 12.7 % (ref 11.5–15.5)
WBC: 10.1 10*3/uL (ref 4.0–10.5)
nRBC: 0 % (ref 0.0–0.2)

## 2023-06-06 LAB — BASIC METABOLIC PANEL
Anion gap: 13 (ref 5–15)
BUN: 23 mg/dL (ref 8–23)
CO2: 26 mmol/L (ref 22–32)
Calcium: 9.2 mg/dL (ref 8.9–10.3)
Chloride: 104 mmol/L (ref 98–111)
Creatinine, Ser: 1.12 mg/dL — ABNORMAL HIGH (ref 0.44–1.00)
GFR, Estimated: 53 mL/min — ABNORMAL LOW (ref >60)
Glucose, Bld: 110 mg/dL — ABNORMAL HIGH (ref 70–99)
Potassium: 3.4 mmol/L — ABNORMAL LOW (ref 3.5–5.1)
Sodium: 143 mmol/L (ref 135–145)

## 2023-06-06 MED ORDER — OXYCODONE-ACETAMINOPHEN 5-325 MG PO TABS: 1.0000 | ORAL_TABLET | ORAL | 0 refills | Status: DC | PRN

## 2023-06-06 MED ORDER — TAMSULOSIN HCL 0.4 MG PO CAPS: 0.4000 mg | ORAL_CAPSULE | Freq: Every day | ORAL | 0 refills | Status: DC

## 2023-06-06 MED ORDER — POTASSIUM CHLORIDE CRYS ER 20 MEQ PO TBCR
40.0000 meq | EXTENDED_RELEASE_TABLET | Freq: Once | ORAL | Status: AC
  Administered 2023-06-06: 40 meq via ORAL
  Filled 2023-06-06: qty 2

## 2023-06-06 MED ORDER — KETOROLAC TROMETHAMINE 30 MG/ML IJ SOLN
15.0000 mg | Freq: Once | INTRAMUSCULAR | Status: AC
  Administered 2023-06-06: 15 mg via INTRAVENOUS
  Filled 2023-06-06: qty 1

## 2023-06-06 MED ORDER — OXYBUTYNIN CHLORIDE 5 MG PO TABS
5.0000 mg | ORAL_TABLET | Freq: Once | ORAL | Status: AC
Start: 2023-06-06 — End: 2023-06-06
  Administered 2023-06-06: 5 mg via ORAL
  Filled 2023-06-06: qty 1

## 2023-06-06 MED ORDER — SODIUM CHLORIDE 0.9 % IV BOLUS
1000.0000 mL | Freq: Once | INTRAVENOUS | Status: AC
  Administered 2023-06-06: 1000 mL via INTRAVENOUS

## 2023-06-06 NOTE — ED Notes (Signed)
ED Provider at bedside. 

## 2023-06-06 NOTE — Discharge Instructions (Addendum)
Drink plenty of fluids.  Return to the emergency department if pain is not being adequately controlled at home, or if you start running a fever.

## 2023-06-06 NOTE — ED Triage Notes (Signed)
Pt to ed pov, c/o urinary retention and pressure since last night around 2200. Pt stated they tried to urinate and only a few drops came out painfully. Denies fever, n/v. Pt ambulatory.

## 2023-06-06 NOTE — ED Provider Notes (Signed)
[place] EMERGENCY DEPARTMENT AT [facility] Provider Note   CSN: [id number] Arrival date & time: 06/06/23  0865     History  Chief complaint: Urinary retention  Julie Miles is a 72 y.o. female.  The history is provided by the patient.  She has history of kidney stones, UTIs and comes in because she has not been able to urinate since 11 PM.  At that time, she urinated only a small amount.  She denies fever, chills.  She denies nausea or vomiting.   Home Medications Prior to Admission medications   Medication Sig Start Date End Date Taking? Authorizing Provider  ALPRAZolam Prudy Feeler) 1 MG tablet Take 1 mg by mouth at bedtime.     [provider]  aspirin EC 81 MG tablet Take 1 tablet (81 mg total) by mouth 2 (two) times daily. 03/13/20   Tarry Kos, MD  cephALEXin (KEFLEX) 500 MG capsule Take 1 capsule (500 mg total) by mouth 2 (two) times daily. 01/18/23   Mardella Layman, MD  levothyroxine (SYNTHROID, LEVOTHROID) 50 MCG tablet Take 50 mcg by mouth daily before breakfast.    [provider]  rosuvastatin (CRESTOR) 20 MG tablet Take 1 tablet (20 mg total) by mouth at bedtime. 02/12/22   Jake Bathe, MD      Allergies    Codeine    Review of Systems   Review of Systems  All other systems reviewed and are negative.   Physical Exam Updated Vital Signs BP (!) 157/54   Pulse 87   Ht 5\' 5"  (1.651 m)   Wt 100.6 kg   SpO2 95%   BMI 36.89 kg/m  Physical Exam Vitals and nursing note reviewed.   72 year old female, resting comfortably and in no acute distress. Vital signs are significant for elevated blood pressure. Oxygen saturation is 95%, which is normal. Head is normocephalic and atraumatic. PERRLA, EOMI.  Back is nontender and there is no CVA tenderness. Lungs are clear without rales, wheezes, or rhonchi. Chest is nontender. Heart has regular rate and rhythm without murmur. Abdomen is soft, flat, with moderate supra pubic  tenderness. Extremities have no cyanosis or edema, full range of motion is present. Skin is warm and dry without rash. Neurologic: Mental status is normal, cranial nerves are intact, moves all extremities equally.  ED Results / Procedures / Treatments   Labs (all labs ordered are listed, but only abnormal results are displayed) Labs Reviewed - No data to display  EKG None  Radiology No results found.  Procedures Procedures    Medications Ordered in ED Medications - No data to display  ED Course/ Medical Decision Making/ A&P                                 Medical Decision Making Amount and/or Complexity of Data Reviewed Labs: ordered. Radiology: ordered.  Risk Prescription drug management.   Acute urinary retention versus bladder spasm.  Bladder scan shows essentially empty bladder.  I have ordered a Foley catheter be placed, and almost no urine was obtained.  I have ordered IV fluids, a dose of oral oxybutynin and I have ordered laboratory workup of CBC and basic metabolic panel.  I have ordered renal stone protocol CT scan.  CT scan show 5 mm calculus at the right ureterovesical junction which is apparently causing her symptoms.  I have independently viewed the images, and agree with  radiologist's interpretation.  I have reviewed her laboratory tests and my interpretation is mild hypokalemia and I have ordered a dose of oral potassium, elevated random glucose which will need to be followed as an outpatient, normal CBC.  At this point, patient asked if she could be transferred to a hospital in [place] so that the calculus could be removed.  I have advised her that that is not the standard practice and that normally she would be sent home with medication and urology follow-up.  She is agreeing to go home.  I am discharging her with prescriptions for tamsulosin, oxycodone-acetaminophen.  I offered prescription for antiemetics but patient has declined.  I am referring her to  urology for follow-up.  Return precautions discussed.  Final Clinical Impression(s) / ED Diagnoses Final diagnoses:  Renal colic on right side  Ureterolithiasis  Hypokalemia  Elevated random blood glucose level    Rx / DC Orders ED Discharge Orders          Ordered    tamsulosin (FLOMAX) 0.4 MG CAPS capsule  Daily        06/06/23 0621    oxyCODONE-acetaminophen (PERCOCET) 5-325 MG tablet  Every 4 hours PRN        06/06/23 4098              Dione Booze, MD 06/06/23 [phone]

## 2023-06-13 DIAGNOSIS — Z6836 Body mass index (BMI) 36.0-36.9, adult: Secondary | ICD-10-CM | POA: Diagnosis not present

## 2023-06-13 DIAGNOSIS — E6609 Other obesity due to excess calories: Secondary | ICD-10-CM | POA: Diagnosis not present

## 2023-06-13 DIAGNOSIS — N200 Calculus of kidney: Secondary | ICD-10-CM | POA: Diagnosis not present

## 2023-07-02 ENCOUNTER — Telehealth: Payer: Self-pay

## 2023-07-02 NOTE — Telephone Encounter (Signed)
Transition Care Management Follow-up Telephone Call Date of discharge and from where: 06/06/2023 [facility] How have you been since you were released from the hospital? Patient stated she has passed the kidney stone and is feeling much better. Any questions or concerns? No  Items Reviewed: Did the pt receive and understand the discharge instructions provided? Yes  Medications obtained and verified? Yes  Other? No  Any new allergies since your discharge? No  Dietary orders reviewed? Yes Do you have support at home? Yes   Follow up appointments reviewed:  PCP Hospital f/u appt confirmed? No  Scheduled to see  on  @ . Specialist Hospital f/u appt confirmed?  Patient stated she has passed the stone and no longer needed to keep the appointment with Malen Gauze, MD  Scheduled to see  on  @ . Are transportation arrangements needed? No  If their condition worsens, is the pt aware to call PCP or go to the Emergency Dept.? Yes Was the patient provided with contact information for the PCP's office or ED? Yes Was to pt encouraged to call back with questions or concerns? Yes  Marcellius Montagna [facility]  [facility], [facility] Guide Direct Dial: [phone]  Website: [email or web address]

## 2023-07-16 ENCOUNTER — Ambulatory Visit: Payer: Medicare HMO | Admitting: Urology

## 2023-07-18 ENCOUNTER — Ambulatory Visit: Payer: Medicare HMO | Admitting: Urology

## 2023-07-31 DIAGNOSIS — L570 Actinic keratosis: Secondary | ICD-10-CM | POA: Diagnosis not present

## 2023-08-13 ENCOUNTER — Telehealth: Payer: Self-pay | Admitting: Orthopaedic Surgery

## 2023-08-13 ENCOUNTER — Encounter: Payer: Self-pay | Admitting: Radiology

## 2023-08-13 NOTE — Telephone Encounter (Signed)
[name] called from [facility]. Patient said that Dr. Roda Shutters said she didn't need it and they need something in writing. CB#[phone]  and Fax:[phone]

## 2023-08-13 NOTE — Telephone Encounter (Signed)
Faxed note.

## 2023-08-22 ENCOUNTER — Ambulatory Visit (INDEPENDENT_AMBULATORY_CARE_PROVIDER_SITE_OTHER): Payer: Medicare HMO | Admitting: Orthopedic Surgery

## 2023-08-22 VITALS — BP 154/87 | HR 89 | Ht 65.0 in | Wt 210.0 lb

## 2023-08-22 DIAGNOSIS — M1812 Unilateral primary osteoarthritis of first carpometacarpal joint, left hand: Secondary | ICD-10-CM | POA: Diagnosis not present

## 2023-08-22 NOTE — Progress Notes (Signed)
Return Patient Visit  Assessment: Julie Miles is a 72 y.o. RHD female with the following: 1. Arthritis of carpometacarpal [facility]) joint of left thumb   Plan: Mrs. Arceo has recurrence of left thumb pain.  Prior injections were successful.  She states that she has had pain for the past month.  She would like to proceed with another injection today.  She will follow-up as needed.   Procedure note injection - LeftThumb CMC joint   Verbal consent was obtained to inject the Left Thumb CMC joint Timeout was completed to confirm the site of injection.  The skin was prepped with alcohol and ethyl chloride was sprayed at the injection site.  A 21-gauge needle was used to inject 40 mg of Depo-Medrol and 1% lidocaine (1 cc) into the Left Thumb CMC joint using a direct anterior approach.  There were no complications. Patient tolerated the procedure well. A sterile bandage was applied     Follow-up: Return if symptoms worsen or fail to improve.  Subjective:  Chief Complaint  Patient presents with   Injections    L thumb     History of Present Illness: Julie Miles is a 72 y.o. female who returns for evaluation of left hand pain.  I have seen her several times for pain at the base of the left thumb.  Most recent injection was very effective until recently.  She states that she has been having a lot of pain in the thumb for the past month.  Pain is primarily at the base of the thumb, in line with the thenar musculature.   Review of Systems: No fevers or chills No numbness or tingling No chest pain No shortness of breath No bowel or bladder dysfunction No GI distress No headaches   Objective: BP (!) 154/87   Pulse 89   Ht 5\' 5"  (1.651 m)   Wt 210 lb (95.3 kg)   BMI 34.95 kg/m   Physical Exam:  General: Alert and oriented. and No acute distress. Gait: Normal gait.  Left hand without deformity.  No swelling is appreciated.  Tenderness to palpation at the base of the  thumb.  Pain with CMC grind test.  Negative Tinel's.  Fingers are warm and well-perfused.  Sensation is intact throughout the left hand.  IMAGING: No new imaging obtained today    New Medications:  No orders of the defined types were placed in this encounter.     Oliver Barre, MD  08/22/2023 11:53 AM

## 2023-08-22 NOTE — Patient Instructions (Signed)

## 2023-09-11 DIAGNOSIS — Z20828 Contact with and (suspected) exposure to other viral communicable diseases: Secondary | ICD-10-CM | POA: Diagnosis not present

## 2023-09-11 DIAGNOSIS — Z6834 Body mass index (BMI) 34.0-34.9, adult: Secondary | ICD-10-CM | POA: Diagnosis not present

## 2023-09-11 DIAGNOSIS — J069 Acute upper respiratory infection, unspecified: Secondary | ICD-10-CM | POA: Diagnosis not present

## 2023-09-11 DIAGNOSIS — E6609 Other obesity due to excess calories: Secondary | ICD-10-CM | POA: Diagnosis not present

## 2023-09-14 ENCOUNTER — Encounter: Payer: Self-pay | Admitting: Emergency Medicine

## 2023-09-14 ENCOUNTER — Ambulatory Visit
Admission: EM | Admit: 2023-09-14 | Discharge: 2023-09-14 | Disposition: A | Discharge status: Home / Self Care | Payer: Medicare HMO | Attending: Family Medicine | Admitting: Family Medicine

## 2023-09-14 DIAGNOSIS — J441 Chronic obstructive pulmonary disease with (acute) exacerbation: Secondary | ICD-10-CM | POA: Diagnosis not present

## 2023-09-14 MED ORDER — ALBUTEROL SULFATE HFA 108 (90 BASE) MCG/ACT IN AERS: 2.0000 | INHALATION_SPRAY | RESPIRATORY_TRACT | 0 refills | Status: AC | PRN

## 2023-09-14 MED ORDER — PROMETHAZINE-DM 6.25-15 MG/5ML PO SYRP: 5.0000 mL | ORAL_SOLUTION | Freq: Four times a day (QID) | ORAL | 0 refills | Status: DC | PRN

## 2023-09-14 MED ORDER — PREDNISONE 20 MG PO TABS: 40.0000 mg | ORAL_TABLET | Freq: Every day | ORAL | 0 refills | Status: DC

## 2023-09-14 MED ORDER — IPRATROPIUM-ALBUTEROL 0.5-2.5 (3) MG/3ML IN SOLN
3.0000 mL | Freq: Once | RESPIRATORY_TRACT | Status: AC
  Administered 2023-09-14: 3 mL via RESPIRATORY_TRACT

## 2023-09-14 NOTE — ED Triage Notes (Signed)
Pt reports a cough, wheezing and generalized body aches. States symptoms started Wednesday and she was seen by her PCP on Thursday and was dx with PNA and given Azithromycin. Pt states a chest x-ray was not performed at her PCP.

## 2023-09-18 ENCOUNTER — Other Ambulatory Visit: Payer: Self-pay | Admitting: Family Medicine

## 2023-09-18 NOTE — ED Provider Notes (Signed)
[facility]    CSN: [id number] Arrival date & time: 09/14/23  1523      History   Chief Complaint Chief Complaint  Patient presents with   Cough   Wheezing    HPI Julie Miles is a 72 y.o. female.   Patient presenting today with ongoing cough, wheezing, body aches for the past 5 or 6 days.  She was seen by her PCP on Thursday, diagnosed with pneumonia and given azithromycin.  States that a chest x-ray was not performed, only an exam and she is wanting a chest x-ray.  She denies known history of chronic pulmonary disease but is a former long-term smoker.  Not taking anything over-the-counter for symptoms currently.  She does not feel like she is improving on the azithromycin.    Past Medical History:  Diagnosis Date   Anxiety    History of blood transfusion 1975   at [facility] after vag delivery   History of kidney stones    Hypercholesterolemia    Hypothyroidism    MRSA (methicillin resistant Staphylococcus aureus) 03/08/2020   confirmed by PCR   PONV (postoperative nausea and vomiting)     Patient Active Problem List   Diagnosis Date Noted   Family history of early CAD 01/24/2022   Dyspnea on exertion 01/24/2022   Exertional angina (HCC) 01/24/2022   Status post total right knee replacement 03/13/2020   Primary osteoarthritis of right knee 02/10/2020   S/P right knee arthroscopy 03/03/2019   Sciatica of left side 11/19/2018   Anxiety state 07/20/2010   HEMATOCHEZIA 07/20/2010    Past Surgical History:  Procedure Laterality Date   ANTERIOR AND POSTERIOR REPAIR  06/24/2012   Procedure: ANTERIOR (CYSTOCELE) AND POSTERIOR REPAIR (RECTOCELE);  Surgeon: Loney Laurence, MD;  Location: [facility];  Service: Gynecology;  Laterality: N/A;  anterior repair   APPENDECTOMY     BLADDER SUSPENSION  06/24/2012   Procedure: TRANSVAGINAL TAPE (TVT) PROCEDURE;  Surgeon: Loney Laurence, MD;  Location: [facility];  Service: Gynecology;  Laterality: N/A;    CHOLECYSTECTOMY     CYSTOSCOPY  06/24/2012   Procedure: CYSTOSCOPY;  Surgeon: Loney Laurence, MD;  Location: [facility];  Service: Gynecology;  Laterality: N/A;   FOOT SURGERY Right    KNEE ARTHROSCOPY W/ MENISCAL REPAIR Right    LAPAROSCOPY  06/24/2012   Procedure: LAPAROSCOPY OPERATIVE;  Surgeon: Loney Laurence, MD;  Location: [facility];  Service: Gynecology;  Laterality: N/A;   TOTAL KNEE ARTHROPLASTY Right 03/13/2020   Procedure: RIGHT TOTAL KNEE ARTHROPLASTY;  Surgeon: Tarry Kos, MD;  Location: [facility];  Service: Orthopedics;  Laterality: Right;   TUBAL LIGATION     VAGINAL HYSTERECTOMY  06/24/2012   Procedure: HYSTERECTOMY VAGINAL;  Surgeon: Loney Laurence, MD;  Location: [facility];  Service: Gynecology;  Laterality: N/A;    OB History     Gravida  2   Para  2   Term  2   Preterm      AB      Living         SAB      IAB      Ectopic      Multiple      Live Births               Home Medications    Prior to Admission medications   Medication Sig Start Date End Date Taking? Authorizing Provider  albuterol (VENTOLIN HFA) 108 (90 Base)  MCG/ACT inhaler Inhale 2 puffs into the lungs every 4 (four) hours as needed. 09/14/23  Yes Particia Nearing, PA-C  predniSONE (DELTASONE) 20 MG tablet Take 2 tablets (40 mg total) by mouth daily with breakfast. 09/14/23  Yes Particia Nearing, PA-C  promethazine-dextromethorphan (PROMETHAZINE-DM) 6.25-15 MG/5ML syrup Take 5 mLs by mouth 4 (four) times daily as needed. 09/14/23  Yes Particia Nearing, PA-C  ALPRAZolam Prudy Feeler) 1 MG tablet Take 1 mg by mouth at bedtime.     [provider]  aspirin EC 81 MG tablet Take 1 tablet (81 mg total) by mouth 2 (two) times daily. 03/13/20   Tarry Kos, MD  levothyroxine (SYNTHROID, LEVOTHROID) 50 MCG tablet Take 50 mcg by mouth daily before breakfast.    [provider]  oxyCODONE-acetaminophen (PERCOCET) 5-325 MG tablet Take 1 tablet by mouth every 4  (four) hours as needed for moderate pain. Patient not taking: Reported on 08/22/2023 06/06/23   Dione Booze, MD  rosuvastatin (CRESTOR) 20 MG tablet Take 1 tablet (20 mg total) by mouth at bedtime. 02/12/22   Jake Bathe, MD  tamsulosin (FLOMAX) 0.4 MG CAPS capsule Take 1 capsule (0.4 mg total) by mouth daily. 06/06/23   Dione Booze, MD    Family History Family History  Problem Relation Age of Onset   Heart disease Mother    Diabetes Mother     Social History Social History   Tobacco Use   Smoking status: Former    Current packs/day: 0.00    Types: Cigarettes    Quit date: 2006    Years since quitting: 18.9   Smokeless tobacco: Never   Tobacco comments:    STATES SHE DOES NOT SMOKE.  Vaping Use   Vaping status: Never Used  Substance Use Topics   Alcohol use: No   Drug use: No     Allergies   Codeine   Review of Systems Review of Systems PER HPI  Physical Exam Triage Vital Signs ED Triage Vitals  Encounter Vitals Group     BP 09/14/23 1534 139/70     Systolic BP Percentile --      Diastolic BP Percentile --      Pulse Rate 09/14/23 1534 (!) 102     Resp 09/14/23 1534 18     Temp 09/14/23 1534 98.5 F (36.9 C)     Temp Source 09/14/23 1534 Oral     SpO2 09/14/23 1534 95 %     Weight --      Height --      Head Circumference --      Peak Flow --      Pain Score 09/14/23 1535 10     Pain Loc --      Pain Education --      Exclude from Growth Chart --    No data found.  Updated Vital Signs BP 139/70 (BP Location: Right Arm)   Pulse (!) 102   Temp 98.5 F (36.9 C) (Oral)   Resp 18   SpO2 95%   Visual Acuity Right Eye Distance:   Left Eye Distance:   Bilateral Distance:    Right Eye Near:   Left Eye Near:    Bilateral Near:     Physical Exam Vitals and nursing note reviewed.  Constitutional:      Appearance: Normal appearance.  HENT:     Head: Atraumatic.     Right Ear: Tympanic membrane and external ear normal.     Left  Ear: Tympanic  membrane and external ear normal.     Nose: Congestion present.     Mouth/Throat:     Mouth: Mucous membranes are moist.     Pharynx: Posterior oropharyngeal erythema present.  Eyes:     Extraocular Movements: Extraocular movements intact.     Conjunctiva/sclera: Conjunctivae normal.  Cardiovascular:     Rate and Rhythm: Normal rate and regular rhythm.     Heart sounds: Normal heart sounds.  Pulmonary:     Effort: Pulmonary effort is normal.     Breath sounds: Wheezing present.  Musculoskeletal:        General: Normal range of motion.     Cervical back: Normal range of motion and neck supple.  Skin:    General: Skin is warm and dry.  Neurological:     Mental Status: She is alert and oriented to person, place, and time.  Psychiatric:        Mood and Affect: Mood normal.        Thought Content: Thought content normal.      UC Treatments / Results  Labs (all labs ordered are listed, but only abnormal results are displayed) Labs Reviewed - No data to display  EKG   Radiology No results found.  Procedures Procedures (including critical care time)  Medications Ordered in UC Medications  ipratropium-albuterol (DUONEB) 0.5-2.5 (3) MG/3ML nebulizer solution 3 mL (3 mLs Nebulization Given 09/14/23 1553)    Initial Impression / Assessment and Plan / UC Course  I have reviewed the triage vital signs and the nursing notes.  Pertinent labs & imaging results that were available during my care of the patient were reviewed by me and considered in my medical decision making (see chart for details).     DuoNeb given in clinic given diffuse wheezes, suspect underlying COPD with exacerbation though no diagnosis formally of this.  Already on azithromycin through PCP, complete this course and will add prednisone, albuterol, Phenergan DM and discussed supportive over-the-counter medications and home care.  Return for worsening symptoms.  Final Clinical Impressions(s) / UC Diagnoses    Final diagnoses:  COPD exacerbation [facility])   Discharge Instructions   None    ED Prescriptions     Medication Sig Dispense Auth. Provider   predniSONE (DELTASONE) 20 MG tablet Take 2 tablets (40 mg total) by mouth daily with breakfast. 10 tablet Particia Nearing, PA-C   albuterol (VENTOLIN HFA) 108 (90 Base) MCG/ACT inhaler Inhale 2 puffs into the lungs every 4 (four) hours as needed. 18 g Roosvelt Maser [place], [place]   promethazine-dextromethorphan (PROMETHAZINE-DM) 6.25-15 MG/5ML syrup Take 5 mLs by mouth 4 (four) times daily as needed. 100 mL Particia Nearing, [place]      PDMP not reviewed this encounter.   Particia Nearing, [place] 09/18/23 0930

## 2023-09-21 ENCOUNTER — Encounter: Payer: Self-pay | Admitting: Emergency Medicine

## 2023-09-21 ENCOUNTER — Ambulatory Visit (INDEPENDENT_AMBULATORY_CARE_PROVIDER_SITE_OTHER): Payer: Medicare HMO

## 2023-09-21 ENCOUNTER — Ambulatory Visit
Admission: EM | Admit: 2023-09-21 | Discharge: 2023-09-21 | Disposition: A | Discharge status: Home / Self Care | Payer: Medicare HMO | Attending: Nurse Practitioner | Admitting: Nurse Practitioner

## 2023-09-21 DIAGNOSIS — Z8709 Personal history of other diseases of the respiratory system: Secondary | ICD-10-CM | POA: Diagnosis not present

## 2023-09-21 DIAGNOSIS — R059 Cough, unspecified: Secondary | ICD-10-CM

## 2023-09-21 MED ORDER — PROMETHAZINE-DM 6.25-15 MG/5ML PO SYRP: 5.0000 mL | ORAL_SOLUTION | Freq: Four times a day (QID) | ORAL | 0 refills | Status: DC | PRN

## 2023-09-21 MED ORDER — PREDNISONE 20 MG PO TABS: 40.0000 mg | ORAL_TABLET | Freq: Every day | ORAL | 0 refills | Status: AC

## 2023-09-21 NOTE — ED Provider Notes (Signed)
[facility]    CSN: [id number] Arrival date & time: 09/21/23  1027      History   Chief Complaint No chief complaint on file.   HPI Julie Miles is a 72 y.o. female.   The history is provided by the patient.   Patient presents for complaints of cough is been present for the past 2 weeks.  Patient was seen by her PCP initially on 1212, she was prescribed azithromycin after being diagnosed with pneumonia.  Patient was subsequently seen in this clinic on 1215.  Patient presented with wheezing, she was given a DuoNeb in the clinic with suspicion for underlying COPD exacerbation.  Patient was prescribed prednisone, an albuterol inhaler, and Promethazine DM for her cough.  Patient reports today stating that her cough is still present.  She denies symptoms of fever, chills, nasal congestion, runny nose, shortness of breath, difficulty breathing, abdominal pain, nausea, vomiting, diarrhea, or rash.  Reports that she now has pain around her ribs due to the cough.  States that she has been taking over-the-counter cough medications to supplement what was prescribed previously.  Past Medical History:  Diagnosis Date   Anxiety    History of blood transfusion 1975   at [facility] after vag delivery   History of kidney stones    Hypercholesterolemia    Hypothyroidism    MRSA (methicillin resistant Staphylococcus aureus) 03/08/2020   confirmed by PCR   PONV (postoperative nausea and vomiting)     Patient Active Problem List   Diagnosis Date Noted   Family history of early CAD 01/24/2022   Dyspnea on exertion 01/24/2022   Exertional angina (HCC) 01/24/2022   Status post total right knee replacement 03/13/2020   Primary osteoarthritis of right knee 02/10/2020   S/P right knee arthroscopy 03/03/2019   Sciatica of left side 11/19/2018   Anxiety state 07/20/2010   HEMATOCHEZIA 07/20/2010    Past Surgical History:  Procedure Laterality Date   ANTERIOR AND  POSTERIOR REPAIR  06/24/2012   Procedure: ANTERIOR (CYSTOCELE) AND POSTERIOR REPAIR (RECTOCELE);  Surgeon: Loney Laurence, MD;  Location: [facility];  Service: Gynecology;  Laterality: N/A;  anterior repair   APPENDECTOMY     BLADDER SUSPENSION  06/24/2012   Procedure: TRANSVAGINAL TAPE (TVT) PROCEDURE;  Surgeon: Loney Laurence, MD;  Location: [facility];  Service: Gynecology;  Laterality: N/A;   CHOLECYSTECTOMY     CYSTOSCOPY  06/24/2012   Procedure: CYSTOSCOPY;  Surgeon: Loney Laurence, MD;  Location: [facility];  Service: Gynecology;  Laterality: N/A;   FOOT SURGERY Right    KNEE ARTHROSCOPY W/ MENISCAL REPAIR Right    LAPAROSCOPY  06/24/2012   Procedure: LAPAROSCOPY OPERATIVE;  Surgeon: Loney Laurence, MD;  Location: [facility];  Service: Gynecology;  Laterality: N/A;   TOTAL KNEE ARTHROPLASTY Right 03/13/2020   Procedure: RIGHT TOTAL KNEE ARTHROPLASTY;  Surgeon: Tarry Kos, MD;  Location: [facility];  Service: Orthopedics;  Laterality: Right;   TUBAL LIGATION     VAGINAL HYSTERECTOMY  06/24/2012   Procedure: HYSTERECTOMY VAGINAL;  Surgeon: Loney Laurence, MD;  Location: [facility];  Service: Gynecology;  Laterality: N/A;    OB History     Gravida  2   Para  2   Term  2   Preterm      AB      Living         SAB      IAB      Ectopic  Multiple      Live Births               Home Medications    Prior to Admission medications   Medication Sig Start Date End Date Taking? Authorizing Provider  predniSONE (DELTASONE) 20 MG tablet Take 2 tablets (40 mg total) by mouth daily with breakfast for 5 days. 09/21/23 09/26/23 Yes Leath-Warren, Sadie Haber, NP  promethazine-dextromethorphan (PROMETHAZINE-DM) 6.25-15 MG/5ML syrup Take 5 mLs by mouth 4 (four) times daily as needed. 09/21/23  Yes Leath-Warren, Sadie Haber, NP  albuterol (VENTOLIN HFA) 108 (90 Base) MCG/ACT inhaler Inhale 2 puffs into the lungs every 4 (four) hours as needed. 09/14/23   Particia Nearing, PA-C   ALPRAZolam Prudy Feeler) 1 MG tablet Take 1 mg by mouth at bedtime.     [provider]  aspirin EC 81 MG tablet Take 1 tablet (81 mg total) by mouth 2 (two) times daily. 03/13/20   Tarry Kos, MD  levothyroxine (SYNTHROID, LEVOTHROID) 50 MCG tablet Take 50 mcg by mouth daily before breakfast.    [provider]  oxyCODONE-acetaminophen (PERCOCET) 5-325 MG tablet Take 1 tablet by mouth every 4 (four) hours as needed for moderate pain. Patient not taking: Reported on 08/22/2023 06/06/23   Dione Booze, MD  rosuvastatin (CRESTOR) 20 MG tablet Take 1 tablet (20 mg total) by mouth at bedtime. 02/12/22   Jake Bathe, MD  tamsulosin (FLOMAX) 0.4 MG CAPS capsule Take 1 capsule (0.4 mg total) by mouth daily. 06/06/23   Dione Booze, MD    Family History Family History  Problem Relation Age of Onset   Heart disease Mother    Diabetes Mother     Social History Social History   Tobacco Use   Smoking status: Former    Current packs/day: 0.00    Types: Cigarettes    Quit date: 2006    Years since quitting: 18.9   Smokeless tobacco: Never   Tobacco comments:    STATES SHE DOES NOT SMOKE.  Vaping Use   Vaping status: Never Used  Substance Use Topics   Alcohol use: No   Drug use: No     Allergies   Codeine   Review of Systems Review of Systems Per HPI  Physical Exam Triage Vital Signs ED Triage Vitals  Encounter Vitals Group     BP 09/21/23 0821 (!) 142/77     Systolic BP Percentile --      Diastolic BP Percentile --      Pulse Rate 09/21/23 0821 (!) 109     Resp 09/21/23 0821 18     Temp 09/21/23 0821 (!) 97.5 F (36.4 C)     Temp Source 09/21/23 0821 Oral     SpO2 09/21/23 0821 95 %     Weight --      Height --      Head Circumference --      Peak Flow --      Pain Score 09/21/23 0823 8     Pain Loc --      Pain Education --      Exclude from Growth Chart --    No data found.  Updated Vital Signs BP (!) 142/77 (BP Location: Right Arm)   Pulse (!)  109   Temp (!) 97.5 F (36.4 C) (Oral)   Resp 18   SpO2 95%   Visual Acuity Right Eye Distance:   Left Eye Distance:   Bilateral Distance:    Right Eye  Near:   Left Eye Near:    Bilateral Near:     Physical Exam Vitals and nursing note reviewed.  Constitutional:      General: She is not in acute distress.    Appearance: Normal appearance.  HENT:     Head: Normocephalic.     Right Ear: Tympanic membrane, ear canal and external ear normal.     Left Ear: Tympanic membrane, ear canal and external ear normal.     Nose: Rhinorrhea present.     Mouth/Throat:     Lips: Pink.     Mouth: Mucous membranes are moist.     Pharynx: Oropharynx is clear. Uvula midline.  Eyes:     Extraocular Movements: Extraocular movements intact.     Conjunctiva/sclera: Conjunctivae normal.     Pupils: Pupils are equal, round, and reactive to light.  Cardiovascular:     Rate and Rhythm: Normal rate and regular rhythm.     Pulses: Normal pulses.     Heart sounds: Normal heart sounds.  Pulmonary:     Effort: Pulmonary effort is normal. No respiratory distress.     Breath sounds: Normal breath sounds. No stridor. No wheezing, rhonchi or rales.  Chest:     Chest wall: No tenderness.  Abdominal:     General: Bowel sounds are normal.     Palpations: Abdomen is soft.     Tenderness: There is no abdominal tenderness.  Musculoskeletal:     Cervical back: Normal range of motion.  Skin:    General: Skin is warm and dry.  Neurological:     General: No focal deficit present.     Mental Status: She is alert and oriented to person, place, and time.  Psychiatric:        Mood and Affect: Mood normal.        Behavior: Behavior normal.      UC Treatments / Results  Labs (all labs ordered are listed, but only abnormal results are displayed) Labs Reviewed - No data to display  EKG   Radiology DG Chest 2 View Result Date: 09/21/2023 CLINICAL DATA:  Cough EXAM: CHEST - 2 VIEW COMPARISON:  03/08/2020  FINDINGS: The lungs are clear without focal pneumonia, edema, pneumothorax or pleural effusion. The cardiopericardial silhouette is within normal limits for size. No acute bony abnormality. IMPRESSION: No active cardiopulmonary disease. Electronically Signed   By: Kennith Center M.D.   On: 09/21/2023 09:00    Procedures Procedures (including critical care time)  Medications Ordered in UC Medications - No data to display  Initial Impression / Assessment and Plan / UC Course  I have reviewed the triage vital signs and the nursing notes.  Pertinent labs & imaging results that were available during my care of the patient were reviewed by me and considered in my medical decision making (see chart for details).  Chest x-ray is negative for active cardiopulmonary disease.  Patient with cough that is been present for approximately 2 weeks.  On exam, lung sounds are clear throughout, room air sats at 95%.  Different diagnoses include acute bronchitis, postviral cough, and COPD exacerbation.  Advised patient that we will treat with another round of prednisone and cough medicine, but if symptoms do not improve with treatment, it is recommended that she follow-up with her PCP for further evaluation.  Supportive care recommendations were provided and discussed with the patient to include fluids, rest, sleeping slightly elevated, and use of a humidifier during sleep.  Patient was in agreement  with this plan of care and verbalized understanding.  All questions were answered.  Patient stable for discharge. Final Clinical Impressions(s) / UC Diagnoses   Final diagnoses:  Cough, unspecified type  History of COPD     Discharge Instructions      The chest x-ray was negative. As discussed, we will provide treatment with a another round of steroids today, but if symptoms do not improve with this treatment, it is recommended that you follow-up with your PCP for further evaluation. Take medication as prescribed.   You may continue use of your albuterol inhaler as needed. Increase fluids and allow for plenty of rest. May take Tylenol as needed for pain, fever, general discomfort. Recommend sleeping slightly elevated and using a humidifier in your bedroom at nighttime during sleep to help with cough. Follow-up as needed.      ED Prescriptions     Medication Sig Dispense Auth. Provider   predniSONE (DELTASONE) 20 MG tablet Take 2 tablets (40 mg total) by mouth daily with breakfast for 5 days. 10 tablet Leath-Warren, Sadie Haber, NP   promethazine-dextromethorphan (PROMETHAZINE-DM) 6.25-15 MG/5ML syrup Take 5 mLs by mouth 4 (four) times daily as needed. 118 mL Leath-Warren, Sadie Haber, NP      PDMP not reviewed this encounter.   Abran Cantor, NP 09/21/23 [phone]

## 2023-09-21 NOTE — Discharge Instructions (Signed)
The chest x-ray was negative. As discussed, we will provide treatment with a another round of steroids today, but if symptoms do not improve with this treatment, it is recommended that you follow-up with your PCP for further evaluation. Take medication as prescribed.  You may continue use of your albuterol inhaler as needed. Increase fluids and allow for plenty of rest. May take Tylenol as needed for pain, fever, general discomfort. Recommend sleeping slightly elevated and using a humidifier in your bedroom at nighttime during sleep to help with cough. Follow-up as needed.

## 2023-09-21 NOTE — ED Triage Notes (Signed)
Cough x 2 weeks.  States ribs hurt from coughing.  Was seen on 12/15 for same

## 2023-11-07 DIAGNOSIS — H524 Presbyopia: Secondary | ICD-10-CM | POA: Diagnosis not present

## 2023-11-07 DIAGNOSIS — Z1331 Encounter for screening for depression: Secondary | ICD-10-CM | POA: Diagnosis not present

## 2023-11-07 DIAGNOSIS — E7849 Other hyperlipidemia: Secondary | ICD-10-CM | POA: Diagnosis not present

## 2023-11-07 DIAGNOSIS — Z0001 Encounter for general adult medical examination with abnormal findings: Secondary | ICD-10-CM | POA: Diagnosis not present

## 2023-11-07 DIAGNOSIS — E6609 Other obesity due to excess calories: Secondary | ICD-10-CM | POA: Diagnosis not present

## 2023-11-07 DIAGNOSIS — E782 Mixed hyperlipidemia: Secondary | ICD-10-CM | POA: Diagnosis not present

## 2023-11-07 DIAGNOSIS — Z0100 Encounter for examination of eyes and vision without abnormal findings: Secondary | ICD-10-CM | POA: Diagnosis not present

## 2023-11-07 DIAGNOSIS — H31092 Other chorioretinal scars, left eye: Secondary | ICD-10-CM | POA: Diagnosis not present

## 2023-11-07 DIAGNOSIS — E039 Hypothyroidism, unspecified: Secondary | ICD-10-CM | POA: Diagnosis not present

## 2023-11-07 DIAGNOSIS — Z6834 Body mass index (BMI) 34.0-34.9, adult: Secondary | ICD-10-CM | POA: Diagnosis not present

## 2023-11-07 DIAGNOSIS — R7309 Other abnormal glucose: Secondary | ICD-10-CM | POA: Diagnosis not present

## 2023-11-13 DIAGNOSIS — Z23 Encounter for immunization: Secondary | ICD-10-CM | POA: Diagnosis not present

## 2023-12-19 ENCOUNTER — Ambulatory Visit (INDEPENDENT_AMBULATORY_CARE_PROVIDER_SITE_OTHER): Admitting: Orthopedic Surgery

## 2023-12-19 DIAGNOSIS — M1812 Unilateral primary osteoarthritis of first carpometacarpal joint, left hand: Secondary | ICD-10-CM

## 2023-12-19 NOTE — Progress Notes (Addendum)
 Return Patient Visit  Assessment: Julie Miles is a 73 y.o. RHD female with the following: 1. Arthritis of carpometacarpal [facility]) joint of left thumb   Plan: Julie Miles has recurrence of left thumb pain.  She continues to have good relief from injections.  We discussed potential treatment options, but she is not interested in surgery at this time.  She would like repeat an injection.  This was completed in clinic today.  Procedure note injection - LeftThumb CMC joint   Verbal consent was obtained to inject the Left Thumb CMC joint Timeout was completed to confirm the site of injection.  The skin was prepped with alcohol and ethyl chloride was sprayed at the injection site.  A 21-gauge needle was used to inject 40 mg of Depo-Medrol and 1% lidocaine (1 cc) into the Left Thumb CMC joint using a direct anterior approach.  There were no complications. Patient tolerated the procedure well. A sterile bandage was applied     Follow-up: Return if symptoms worsen or fail to improve.  Subjective:  Chief Complaint  Patient presents with   Injections    L thumb pain has been back approx 2 wks.     History of Present Illness: Julie Miles is a 73 y.o. female who returns for evaluation of left hand pain.  She continues to have pain in the base of the left thumb.  Prior injections have been very helpful.  Her pain is recently returned.  She would like to continue with an injection today.   Review of Systems: No fevers or chills No numbness or tingling No chest pain No shortness of breath No bowel or bladder dysfunction No GI distress No headaches   Objective: There were no vitals taken for this visit.  Physical Exam:  General: Alert and oriented. and No acute distress. Gait: Normal gait.  Left hand without deformity.  No swelling is appreciated.  Tenderness to palpation at the base of the thumb.  Pain with CMC grind test.  Negative Tinel's.  Fingers are warm and  well-perfused.  Sensation is intact throughout the left hand.  IMAGING: No new imaging obtained today    New Medications:  No orders of the defined types were placed in this encounter.     Oliver Barre, MD  12/19/2023 11:24 AM

## 2023-12-19 NOTE — Patient Instructions (Signed)

## 2024-01-31 ENCOUNTER — Observation Stay (HOSPITAL_COMMUNITY)
Admission: EM | Admit: 2024-01-31 | Discharge: 2024-02-02 | Disposition: A | Discharge status: Home / Self Care | Attending: Internal Medicine | Admitting: Internal Medicine

## 2024-01-31 ENCOUNTER — Encounter (HOSPITAL_COMMUNITY): Payer: Self-pay

## 2024-01-31 ENCOUNTER — Emergency Department (HOSPITAL_COMMUNITY)

## 2024-01-31 ENCOUNTER — Other Ambulatory Visit: Payer: Self-pay

## 2024-01-31 DIAGNOSIS — R0789 Other chest pain: Secondary | ICD-10-CM | POA: Diagnosis present

## 2024-01-31 DIAGNOSIS — Z96651 Presence of right artificial knee joint: Secondary | ICD-10-CM | POA: Insufficient documentation

## 2024-01-31 DIAGNOSIS — I4891 Unspecified atrial fibrillation: Principal | ICD-10-CM

## 2024-01-31 DIAGNOSIS — I480 Paroxysmal atrial fibrillation: Principal | ICD-10-CM | POA: Diagnosis present

## 2024-01-31 DIAGNOSIS — E039 Hypothyroidism, unspecified: Secondary | ICD-10-CM | POA: Insufficient documentation

## 2024-01-31 DIAGNOSIS — Z79899 Other long term (current) drug therapy: Secondary | ICD-10-CM | POA: Diagnosis not present

## 2024-01-31 DIAGNOSIS — E782 Mixed hyperlipidemia: Secondary | ICD-10-CM | POA: Diagnosis not present

## 2024-01-31 DIAGNOSIS — F419 Anxiety disorder, unspecified: Secondary | ICD-10-CM | POA: Insufficient documentation

## 2024-01-31 DIAGNOSIS — Z87891 Personal history of nicotine dependence: Secondary | ICD-10-CM | POA: Diagnosis not present

## 2024-01-31 DIAGNOSIS — R079 Chest pain, unspecified: Secondary | ICD-10-CM | POA: Diagnosis not present

## 2024-01-31 DIAGNOSIS — R072 Precordial pain: Secondary | ICD-10-CM

## 2024-01-31 LAB — BASIC METABOLIC PANEL WITH GFR
Anion gap: 12 (ref 5–15)
BUN: 23 mg/dL (ref 8–23)
CO2: 22 mmol/L (ref 22–32)
Calcium: 9.1 mg/dL (ref 8.9–10.3)
Chloride: 104 mmol/L (ref 98–111)
Creatinine, Ser: 0.93 mg/dL (ref 0.44–1.00)
GFR, Estimated: >60 mL/min (ref >60)
Glucose, Bld: 162 mg/dL — ABNORMAL HIGH (ref 70–99)
Potassium: 3.9 mmol/L (ref 3.5–5.1)
Sodium: 138 mmol/L (ref 135–145)

## 2024-01-31 LAB — CBC
HCT: 42.6 % (ref 36.0–46.0)
Hemoglobin: 14.6 g/dL (ref 12.0–15.0)
MCH: 30.9 pg (ref 26.0–34.0)
MCHC: 34.3 g/dL (ref 30.0–36.0)
MCV: 90.1 fL (ref 80.0–100.0)
Platelets: 292 10*3/uL (ref 150–400)
RBC: 4.73 MIL/uL (ref 3.87–5.11)
RDW: 12 % (ref 11.5–15.5)
WBC: 8 10*3/uL (ref 4.0–10.5)
nRBC: 0 % (ref 0.0–0.2)

## 2024-01-31 LAB — MRSA NEXT GEN BY PCR, NASAL: MRSA by PCR Next Gen: NOT DETECTED

## 2024-01-31 LAB — TROPONIN I (HIGH SENSITIVITY)
Troponin I (High Sensitivity): 6 ng/L (ref <18)
Troponin I (High Sensitivity): 47 ng/L — ABNORMAL HIGH (ref <18)

## 2024-01-31 LAB — TSH: TSH: 0.866 u[IU]/mL (ref 0.350–4.500)

## 2024-01-31 MED ORDER — ALPRAZOLAM 0.5 MG PO TABS
1.0000 mg | ORAL_TABLET | Freq: Every day | ORAL | Status: DC
  Administered 2024-01-31 – 2024-02-01 (×2): 1 mg via ORAL
  Filled 2024-01-31 (×2): qty 2

## 2024-01-31 MED ORDER — LEVOTHYROXINE SODIUM 25 MCG PO TABS
50.0000 ug | ORAL_TABLET | Freq: Every day | ORAL | Status: DC
  Administered 2024-02-01 – 2024-02-02 (×2): 50 ug via ORAL
  Filled 2024-01-31 (×2): qty 2

## 2024-01-31 MED ORDER — DILTIAZEM HCL-DEXTROSE 125-5 MG/125ML-% IV SOLN (PREMIX)
5.0000 mg/h | INTRAVENOUS | Status: DC
  Administered 2024-01-31: 5 mg/h via INTRAVENOUS
  Filled 2024-01-31: qty 125

## 2024-01-31 MED ORDER — AMIODARONE HCL IN DEXTROSE 360-4.14 MG/200ML-% IV SOLN
30.0000 mg/h | INTRAVENOUS | Status: DC
  Administered 2024-02-01: 30 mg/h via INTRAVENOUS

## 2024-01-31 MED ORDER — ENOXAPARIN SODIUM 100 MG/ML IJ SOSY
100.0000 mg | PREFILLED_SYRINGE | Freq: Once | INTRAMUSCULAR | Status: AC
  Administered 2024-01-31: 100 mg via SUBCUTANEOUS
  Filled 2024-01-31: qty 1

## 2024-01-31 MED ORDER — CHLORHEXIDINE GLUCONATE CLOTH 2 % EX PADS
6.0000 | MEDICATED_PAD | Freq: Every day | CUTANEOUS | Status: DC
  Administered 2024-01-31 – 2024-02-02 (×3): 6 via TOPICAL

## 2024-01-31 MED ORDER — AMIODARONE IV BOLUS ONLY 150 MG/100ML
150.0000 mg | Freq: Once | INTRAVENOUS | Status: AC
  Administered 2024-01-31: 150 mg via INTRAVENOUS
  Filled 2024-01-31: qty 100

## 2024-01-31 MED ORDER — ROSUVASTATIN CALCIUM 10 MG PO TABS
10.0000 mg | ORAL_TABLET | Freq: Every day | ORAL | Status: DC
  Administered 2024-01-31 – 2024-02-01 (×2): 10 mg via ORAL
  Filled 2024-01-31 (×2): qty 1

## 2024-01-31 MED ORDER — AMIODARONE HCL IN DEXTROSE 360-4.14 MG/200ML-% IV SOLN
60.0000 mg/h | INTRAVENOUS | Status: DC
  Administered 2024-01-31: 60 mg/h via INTRAVENOUS
  Filled 2024-01-31 (×2): qty 200

## 2024-01-31 MED ORDER — ALPRAZOLAM 0.5 MG PO TABS: 1.0000 mg | ORAL_TABLET | Freq: Every day | ORAL | Status: DC

## 2024-01-31 MED ORDER — AMIODARONE HCL IN DEXTROSE 360-4.14 MG/200ML-% IV SOLN: 30.0000 mg/h | INTRAVENOUS | Status: DC

## 2024-01-31 MED ORDER — DILTIAZEM LOAD VIA INFUSION
20.0000 mg | Freq: Once | INTRAVENOUS | Status: AC
  Administered 2024-01-31: 20 mg via INTRAVENOUS
  Filled 2024-01-31: qty 20

## 2024-01-31 NOTE — H&P (Signed)
 History and Physical    Patient: Julie Miles ZOX:[id number] DOB: 03/28/1951 DOA: 01/31/2024 DOS: the patient was seen and examined on 01/31/2024 PCP: Minus Amel, MD  Patient coming from: Home  Chief Complaint: No chief complaint on file.  HPI: Julie Miles is a 73 y.o. female with medical history significant of hypothyroidism, hyperlipidemia, anxiety who presents to the emergency department from home due to sensation of chest heaviness today.  She complains of about 2 months of intermittent irregular heart rhythms or rapid heart rate/palpitations without chest pain, shortness of breath, nausea, vomiting, abdominal pain.  She denies any recent change in meds, use of caffeine, recreational drug use, tobacco or alcohol use.  ED Course:  In the emergency department, she was tachycardic, but other vital signs were within normal range..  Workup in the ED showed normal CBC and BMP except for blood glucose of 162, TSH 0.866. Chest x-ray showed no active disease Patient was treated with IV Cardizem 20 mg x 1, was then started on IV Cardizem drip.  Therapeutic Lovenox x 1 was given in the ED.  TR[facility] was asked to admit patient  Review of Systems: Review of systems as noted in the HPI. All other systems reviewed and are negative.   Past Medical History:  Diagnosis Date   Anxiety    History of blood transfusion 1975   at [facility] after vag delivery   History of kidney stones    Hypercholesterolemia    Hypothyroidism    MRSA (methicillin resistant Staphylococcus aureus) 03/08/2020   confirmed by PCR   PONV (postoperative nausea and vomiting)    Past Surgical History:  Procedure Laterality Date   ANTERIOR AND POSTERIOR REPAIR  06/24/2012   Procedure: ANTERIOR (CYSTOCELE) AND POSTERIOR REPAIR (RECTOCELE);  Surgeon: Oddis Bench, MD;  Location: [facility];  Service: Gynecology;  Laterality: N/A;  anterior repair   APPENDECTOMY     BLADDER SUSPENSION  06/24/2012   Procedure:  TRANSVAGINAL TAPE (TVT) PROCEDURE;  Surgeon: Oddis Bench, MD;  Location: [facility];  Service: Gynecology;  Laterality: N/A;   CHOLECYSTECTOMY     CYSTOSCOPY  06/24/2012   Procedure: CYSTOSCOPY;  Surgeon: Oddis Bench, MD;  Location: [facility];  Service: Gynecology;  Laterality: N/A;   FOOT SURGERY Right    KNEE ARTHROSCOPY W/ MENISCAL REPAIR Right    LAPAROSCOPY  06/24/2012   Procedure: LAPAROSCOPY OPERATIVE;  Surgeon: Oddis Bench, MD;  Location: [facility];  Service: Gynecology;  Laterality: N/A;   TOTAL KNEE ARTHROPLASTY Right 03/13/2020   Procedure: RIGHT TOTAL KNEE ARTHROPLASTY;  Surgeon: Wes Hamman, MD;  Location: [facility];  Service: Orthopedics;  Laterality: Right;   TUBAL LIGATION     VAGINAL HYSTERECTOMY  06/24/2012   Procedure: HYSTERECTOMY VAGINAL;  Surgeon: Oddis Bench, MD;  Location: [facility];  Service: Gynecology;  Laterality: N/A;    Social History:  reports that she quit smoking about 19 years ago. Her smoking use included cigarettes. She has never used smokeless tobacco. She reports that she does not drink alcohol and does not use drugs.   Allergies  Allergen Reactions   Codeine Nausea And Vomiting    Family History  Problem Relation Age of Onset   Heart disease Mother    Diabetes Mother      Prior to Admission medications   Medication Sig Start Date End Date Taking? Authorizing Provider  albuterol  (VENTOLIN  HFA) 108 (90 Base) MCG/ACT inhaler Inhale 2 puffs into the lungs every  4 (four) hours as needed. Patient taking differently: Inhale 2 puffs into the lungs every 4 (four) hours as needed for wheezing or shortness of breath. 09/14/23  Yes Corbin Dess, PA-C  ALPRAZolam  (XANAX ) 1 MG tablet Take 1 mg by mouth at bedtime.    Yes [provider]  levothyroxine  (SYNTHROID , LEVOTHROID) 50 MCG tablet Take 50 mcg by mouth daily before breakfast.   Yes [provider]  rosuvastatin  (CRESTOR ) 10 MG tablet Take 10 mg by mouth at  bedtime. 12/12/23  Yes [provider]    Physical Exam: BP (!) 162/104   Pulse (!) 123   Temp 97.9 F (36.6 C)   Resp (!) 25   Wt 97.5 kg   SpO2 91%   BMI 35.78 kg/m   General: 73 y.o. year-old female well developed well nourished in no acute distress.  Alert and oriented x3. HEENT: NCAT, EOMI Neck: Supple, trachea medial Cardiovascular: Tachycardia.  Irregular rate and rhythm with no rubs or gallops.  No thyromegaly or JVD noted.  No lower extremity edema. 2/4 pulses in all 4 extremities. Respiratory: Clear to auscultation with no wheezes or rales. Good inspiratory effort. Abdomen: Soft, nontender nondistended with normal bowel sounds x4 quadrants. Muskuloskeletal: No cyanosis, clubbing or edema noted bilaterally Neuro: CN II-XII intact, strength 5/5 x 4, sensation, reflexes intact Skin: No ulcerative lesions noted or rashes Psychiatry: Judgement and insight appear normal. Mood is appropriate for condition and setting          Labs on Admission:  Basic Metabolic Panel: Recent Labs  Lab 01/31/24 1919  NA 138  K 3.9  CL 104  CO2 22  GLUCOSE 162*  BUN 23  CREATININE 0.93  CALCIUM  9.1   Liver Function Tests: No results for input(s): "AST", "ALT", "ALKPHOS", "BILITOT", "PROT", "ALBUMIN" in the last 168 hours. No results for input(s): "LIPASE", "AMYLASE" in the last 168 hours. No results for input(s): "AMMONIA" in the last 168 hours. CBC: Recent Labs  Lab 01/31/24 1919  WBC 8.0  HGB 14.6  HCT 42.6  MCV 90.1  PLT 292   Cardiac Enzymes: No results for input(s): "CKTOTAL", "CKMB", "CKMBINDEX", "TROPONINI" in the last 168 hours.  BNP (last 3 results) No results for input(s): "BNP" in the last 8760 hours.  ProBNP (last 3 results) No results for input(s): "PROBNP" in the last 8760 hours.  CBG: No results for input(s): "GLUCAP" in the last 168 hours.  Radiological Exams on Admission: DG Chest Port 1 View Result Date: 01/31/2024 CLINICAL DATA:  Chest  pain EXAM: PORTABLE CHEST 1 VIEW COMPARISON:  Chest x-ray 09/21/2023. FINDINGS: The heart size and mediastinal contours are within normal limits. Both lungs are clear. No visible pleural effusions or pneumothorax. No acute osseous abnormality. IMPRESSION: No active disease. Electronically Signed   By: Stevenson Elbe M.D.   On: 01/31/2024 19:33    EKG: I independently viewed the EKG done and my findings are as followed: Atrial fibrillation with RVR  Assessment/Plan Present on Admission:  Paroxysmal atrial fibrillation with RVR (HCC)  Anxiety  Principal Problem:   Paroxysmal atrial fibrillation with RVR (HCC) Active Problems:   Anxiety   Mixed hyperlipidemia   Acquired hypothyroidism  Paroxysmal atrial fibrillation IV Cardizem 20 mg x 1 was given, patient was started on IV Cardizem drip Therapeutic Lovenox was given, consider transitioning to DOAC in the morning Echocardiogram done on 01/17/2022 showed LVEF of 77 5%.  No RWMA.  Indeterminate LV diastolic parameters.  Echocardiogram will be done  in the morning to rule out any valvular/structural abnormality  Mixed hyperlipidemia Continue Crestor   Acquired hypothyroidism Continue Synthroid   Anxiety Continuing Xanax  per home regimen  DVT prophylaxis: Lovenox; consider transitioning to DOAC in the morning  Code Status: Full code  Family Communication: Sister-in-law at bedside (all questions answered to satisfaction)  Consults: None  Severity of Illness: The appropriate patient status for this patient is OBSERVATION. Observation status is judged to be reasonable and necessary in order to provide the required intensity of service to ensure the patient's safety. The patient's presenting symptoms, physical exam findings, and initial radiographic and laboratory data in the context of their medical condition is felt to place them at decreased risk for further clinical deterioration. Furthermore, it is anticipated that the patient will  be medically stable for discharge from the hospital within 2 midnights of admission.   Author: Keondre Markson, DO 01/31/2024 9:13 PM  For on call review [email or web address].

## 2024-01-31 NOTE — Progress Notes (Signed)
 Progress note  RN called due to patient's HR still in the 150s despite maxing out on IV Cardizem drip.  Patient appears not to be responding to Cardizem drip.  It was decided to transition the drip to amiodarone drip.

## 2024-01-31 NOTE — ED Notes (Signed)
 Pt denies any chest pain at present, but reports "heavy feeling." Pt reports feeling has not changed since this started.

## 2024-01-31 NOTE — ED Provider Notes (Signed)
 [place] EMERGENCY DEPARTMENT AT [facility] Provider Note   CSN: [id number] Arrival date & time: 01/31/24  1854     History  No chief complaint on file.   Julie Miles is a 73 y.o. female.  Pt indicates chest heaviness today. Indicates intermittent has been aware of irregular or rapid heart beating in past two months, and has noticed very intermittently prior to that. Denies exertional or pleuritic chest pain. No associated sob, nv  or diaphoresis. No fever/chills. No unusual sweats, or wt loss. No recent change in meds. No heavy caffeine use. No etoh use. Denies hx afib, dysrhythmia, or cad. No leg pain or swelling.   The history is provided by the patient and medical records.       Home Medications Prior to Admission medications   Medication Sig Start Date End Date Taking? Authorizing Provider  albuterol  (VENTOLIN  HFA) 108 (90 Base) MCG/ACT inhaler Inhale 2 puffs into the lungs every 4 (four) hours as needed. Patient taking differently: Inhale 2 puffs into the lungs every 4 (four) hours as needed for wheezing or shortness of breath. 09/14/23  Yes Corbin Dess, PA-C  ALPRAZolam  (XANAX ) 1 MG tablet Take 1 mg by mouth at bedtime.    Yes [provider]  levothyroxine  (SYNTHROID , LEVOTHROID) 50 MCG tablet Take 50 mcg by mouth daily before breakfast.   Yes [provider]  rosuvastatin  (CRESTOR ) 10 MG tablet Take 10 mg by mouth at bedtime. 12/12/23  Yes [provider]      Allergies    Codeine    Review of Systems   Review of Systems  Constitutional:  Negative for chills and fever.  HENT:  Negative for sore throat.   Eyes:  Negative for redness.  Respiratory:  Negative for cough and shortness of breath.   Cardiovascular:  Positive for chest pain and palpitations. Negative for leg swelling.  Gastrointestinal:  Negative for abdominal pain and vomiting.  Genitourinary:  Negative for dysuria and flank pain.  Musculoskeletal:   Negative for back pain and neck pain.  Skin:  Negative for rash.  Neurological:  Negative for headaches.    Physical Exam Updated Vital Signs BP 139/71   Pulse (!) 154   Temp 97.9 F (36.6 C)   Resp 19   SpO2 93%  Physical Exam Vitals and nursing note reviewed.  Constitutional:      Appearance: Normal appearance. She is well-developed.  HENT:     Head: Atraumatic.     Nose: Nose normal.     Mouth/Throat:     Mouth: Mucous membranes are moist.  Eyes:     General: No scleral icterus.    Conjunctiva/sclera: Conjunctivae normal.  Neck:     Trachea: No tracheal deviation.     Comments: Trachea midline, thyroid  not grossly enlarged or tender.  Cardiovascular:     Rate and Rhythm: Tachycardia present. Rhythm irregular.     Pulses: Normal pulses.     Heart sounds: Normal heart sounds. No murmur heard.    No friction rub. No gallop.  Pulmonary:     Effort: Pulmonary effort is normal. No respiratory distress.     Breath sounds: Normal breath sounds.  Abdominal:     General: Bowel sounds are normal. There is no distension.     Palpations: Abdomen is soft. There is no mass.     Tenderness: There is no abdominal tenderness.  Genitourinary:    Comments: No cva tenderness.  Musculoskeletal:  General: No swelling or tenderness.     Cervical back: Normal range of motion and neck supple. No rigidity. No muscular tenderness.     Right lower leg: No edema.     Left lower leg: No edema.  Skin:    General: Skin is warm and dry.     Findings: No rash.  Neurological:     Mental Status: She is alert.     Comments: Alert, speech normal. Motor/sens grossly intact bil.   Psychiatric:        Mood and Affect: Mood normal.     ED Results / Procedures / Treatments   Labs (all labs ordered are listed, but only abnormal results are displayed) Results for orders placed or performed during the hospital encounter of 01/31/24  Basic metabolic panel   Collection Time: 01/31/24  7:19 PM   Result Value Ref Range   Sodium 138 135 - 145 mmol/L   Potassium 3.9 3.5 - 5.1 mmol/L   Chloride 104 98 - 111 mmol/L   CO2 22 22 - 32 mmol/L   Glucose, Bld 162 (H) 70 - 99 mg/dL   BUN 23 8 - 23 mg/dL   Creatinine, Ser 1.61 0.44 - 1.00 mg/dL   Calcium  9.1 8.9 - 10.3 mg/dL   GFR, Estimated >09 >60 mL/min   Anion gap 12 5 - 15  CBC   Collection Time: 01/31/24  7:19 PM  Result Value Ref Range   WBC 8.0 4.0 - 10.5 K/uL   RBC 4.73 3.87 - 5.11 MIL/uL   Hemoglobin 14.6 12.0 - 15.0 g/dL   HCT 45.4 09.8 - 11.9 %   MCV 90.1 80.0 - 100.0 fL   MCH 30.9 26.0 - 34.0 pg   MCHC 34.3 30.0 - 36.0 g/dL   RDW 14.7 82.9 - 56.2 %   Platelets 292 150 - 400 K/uL   nRBC 0.0 0.0 - 0.2 %  TSH   Collection Time: 01/31/24  7:19 PM  Result Value Ref Range   TSH 0.866 0.350 - 4.500 uIU/mL  Troponin I (High Sensitivity)   Collection Time: 01/31/24  7:19 PM  Result Value Ref Range   Troponin I (High Sensitivity) 6 <18 ng/L   DG Chest Port 1 View Result Date: 01/31/2024 CLINICAL DATA:  Chest pain EXAM: PORTABLE CHEST 1 VIEW COMPARISON:  Chest x-ray 09/21/2023. FINDINGS: The heart size and mediastinal contours are within normal limits. Both lungs are clear. No visible pleural effusions or pneumothorax. No acute osseous abnormality. IMPRESSION: No active disease. Electronically Signed   By: Stevenson Elbe M.D.   On: 01/31/2024 19:33     EKG EKG Interpretation Date/Time:  Saturday Jan 31 2024 19:12:48 EDT Ventricular Rate:  166 PR Interval:    QRS Duration:  78 QT Interval:  239 QTC Calculation: 398 R Axis:   64  Text Interpretation: Atrial fibrillation with rapid V-rate Repolarization abnormality, prob rate related Non-specific ST-t changes Confirmed by Guadalupe Lee ([id number]) on 01/31/2024 7:15:12 PM  Radiology DG Chest Port 1 View Result Date: 01/31/2024 CLINICAL DATA:  Chest pain EXAM: PORTABLE CHEST 1 VIEW COMPARISON:  Chest x-ray 09/21/2023. FINDINGS: The heart size and mediastinal contours are  within normal limits. Both lungs are clear. No visible pleural effusions or pneumothorax. No acute osseous abnormality. IMPRESSION: No active disease. Electronically Signed   By: Stevenson Elbe M.D.   On: 01/31/2024 19:33    Procedures Procedures    Medications Ordered in ED Medications  diltiazem (CARDIZEM) 1 mg/mL load via  infusion 20 mg (20 mg Intravenous Bolus from Bag 01/31/24 1925)    And  diltiazem (CARDIZEM) 125 mg in dextrose  5% 125 mL (1 mg/mL) infusion (15 mg/hr Intravenous Rate/Dose Change 01/31/24 2016)    ED Course/ Medical Decision Making/ A&P                                 Medical Decision Making Problems Addressed: Atrial fibrillation, rapid [facility]): acute illness or injury with systemic symptoms that poses a threat to life or bodily functions New onset a-fib [facility]): acute illness or injury with systemic symptoms that poses a threat to life or bodily functions Precordial chest pain: acute illness or injury with systemic symptoms that poses a threat to life or bodily functions  Amount and/or Complexity of Data Reviewed External Data Reviewed: notes. Labs: ordered. Decision-making details documented in ED Course. Radiology: ordered and independent interpretation performed. Decision-making details documented in ED Course. ECG/medicine tests: ordered and independent interpretation performed. Decision-making details documented in ED Course. Discussion of management or test interpretation with external provider(s): medicine  Risk Prescription drug management. Decision regarding hospitalization.    Iv ns. Continuous pulse ox and cardiac monitoring. Labs ordered/sent. Imaging ordered.   Differential diagnosis includes afib/rvr, acs, etc. Dispo decision including potential need for admission considered - will get labs and imaging and reassess.   Reviewed nursing notes and prior charts for additional history. External reports reviewed.   Cardiac monitor: afib rate 170.    Labs reviewed/interpreted by me - wbc and hgb normal. Chem unremarkable. Trop normal. Tsh normal.   Cardizem bolus and gtt. Ivf.   Xrays reviewed/interpreted by me - no pna.   Pt not on anticoagulation therapy. With symptoms present in past two months, not able to safely ED cardiovert.    Medicine consulted for admission.  Recheck gtt titrated. Rate improved.   CRITICAL CARE  RE Atrial fib with rapid ventricular response. Precordial chest pain.  Performed by: Cortne Amara E Lemarcus Baggerly Total critical care time: 45 minutes Critical care time was exclusive of separately billable procedures and treating other patients. Critical care was necessary to treat or prevent imminent or life-threatening deterioration. Critical care was time spent personally by me on the following activities: development of treatment plan with patient and/or surrogate as well as nursing, discussions with consultants, evaluation of patient's response to treatment, examination of patient, obtaining history from patient or surrogate, ordering and performing treatments and interventions, ordering and review of laboratory studies, ordering and review of radiographic studies, pulse oximetry and re-evaluation of patient's condition.          Final Clinical Impression(s) / ED Diagnoses Final diagnoses:  None    Rx / DC Orders ED Discharge Orders     None         Guadalupe Lee, MD 01/31/24 2026

## 2024-01-31 NOTE — ED Triage Notes (Signed)
 Pt c/o chest heaviness that happens a few times a month for the last few months. Pt states she feels like her heart is beating abnormally. No cardiac hx.

## 2024-02-01 ENCOUNTER — Other Ambulatory Visit (HOSPITAL_COMMUNITY): Payer: Self-pay | Admitting: *Deleted

## 2024-02-01 ENCOUNTER — Observation Stay (HOSPITAL_BASED_OUTPATIENT_CLINIC_OR_DEPARTMENT_OTHER)

## 2024-02-01 DIAGNOSIS — E782 Mixed hyperlipidemia: Secondary | ICD-10-CM | POA: Diagnosis not present

## 2024-02-01 DIAGNOSIS — I480 Paroxysmal atrial fibrillation: Secondary | ICD-10-CM | POA: Diagnosis not present

## 2024-02-01 DIAGNOSIS — I4891 Unspecified atrial fibrillation: Secondary | ICD-10-CM | POA: Diagnosis not present

## 2024-02-01 DIAGNOSIS — E876 Hypokalemia: Secondary | ICD-10-CM | POA: Diagnosis not present

## 2024-02-01 DIAGNOSIS — F419 Anxiety disorder, unspecified: Secondary | ICD-10-CM | POA: Diagnosis not present

## 2024-02-01 DIAGNOSIS — E039 Hypothyroidism, unspecified: Secondary | ICD-10-CM | POA: Diagnosis not present

## 2024-02-01 LAB — ECHOCARDIOGRAM COMPLETE
AR max vel: 2.11 cm2
AV Area VTI: 1.74 cm2
AV Area mean vel: 1.8 cm2
AV Mean grad: 6 mmHg
AV Peak grad: 9.1 mmHg
Ao pk vel: 1.51 m/s
Area-P 1/2: 3.27 cm2
Height: 65 in
S' Lateral: 2 cm
Weight: 3347.46 [oz_av]

## 2024-02-01 LAB — T4, FREE: Free T4: 0.99 ng/dL (ref 0.61–1.12)

## 2024-02-01 MED ORDER — METOPROLOL TARTRATE 25 MG PO TABS
25.0000 mg | ORAL_TABLET | Freq: Two times a day (BID) | ORAL | Status: DC
  Administered 2024-02-01 – 2024-02-02 (×2): 25 mg via ORAL
  Filled 2024-02-01 (×3): qty 1

## 2024-02-01 MED ORDER — PANTOPRAZOLE SODIUM 40 MG PO TBEC
40.0000 mg | DELAYED_RELEASE_TABLET | Freq: Every day | ORAL | Status: DC
  Administered 2024-02-01 – 2024-02-02 (×2): 40 mg via ORAL
  Filled 2024-02-01 (×2): qty 1

## 2024-02-01 MED ORDER — AMIODARONE HCL 200 MG PO TABS
200.0000 mg | ORAL_TABLET | Freq: Every day | ORAL | Status: DC
  Administered 2024-02-01 – 2024-02-02 (×2): 200 mg via ORAL
  Filled 2024-02-01 (×2): qty 1

## 2024-02-01 MED ORDER — APIXABAN 5 MG PO TABS
5.0000 mg | ORAL_TABLET | Freq: Two times a day (BID) | ORAL | Status: DC
  Administered 2024-02-01 – 2024-02-02 (×3): 5 mg via ORAL
  Filled 2024-02-01 (×3): qty 1

## 2024-02-01 NOTE — Progress Notes (Signed)
 Progress Note   Patient: Julie Miles ZOX:[id number] DOB: 1951-04-05 DOA: 01/31/2024     0 DOS: the patient was seen and examined on 02/01/2024   Brief hospital admission narrative: As per H&P written by Dr. Elyse Hand 01/31/2024 YAILEEN NACHBAR is a 73 y.o. female with medical history significant of hypothyroidism, hyperlipidemia, anxiety who presents to the emergency department from home due to sensation of chest heaviness today.  She complains of about 2 months of intermittent irregular heart rhythms or rapid heart rate/palpitations without chest pain, shortness of breath, nausea, vomiting, abdominal pain.  She denies any recent change in meds, use of caffeine, recreational drug use, tobacco or alcohol use.   ED Course:  In the emergency department, she was tachycardic, but other vital signs were within normal range..  Workup in the ED showed normal CBC and BMP except for blood glucose of 162, TSH 0.866. Chest x-ray showed no active disease Patient was treated with IV Cardizem 20 mg x 1, was then started on IV Cardizem drip.  Therapeutic Lovenox x 1 was given in the ED.  [facility] was asked to admit patient  Assessment and plan 1-paroxysmal atrial fibrillation with RVR - Initially failed to respond to the use of IV Cardizem and ended requiring amiodarone drip - At time of examination patient has transition herself into sinus rhythm with controlled rate - Will attempt getting patient off amiodarone drip using oral amiodarone and low-dose metoprolol  - Continue telemetry monitoring - Follow 2D echo - Eliquis for anticoagulation/secondary prevention. -Cardiology service has been consulted and will follow further recommendations.  2-hypothyroidism - TSH within normal limit -continue Synthroid   3-hyperlipidemia - Continue Crestor   4-anxiety - Continue Xanax  per home regimen.  5-class I obesity -Body mass index is 34.82 kg/m. -Low-calorie diet, portion control and increase physical  activity discussed with patient.  6-COPD - No acute exacerbation currently appreciated - Continue as needed bronchodilator management.  7-hypokalemia - Electrolytes have been repleted and currently within normal limit - Continue to follow trend.  Subjective:  No fever, no chest pain, no nausea or vomiting.  Reports feeling better and at time of examination expressed not heart racing feelings.  Physical Exam: Vitals:   02/01/24 0830 02/01/24 0900 02/01/24 0930 02/01/24 1000  BP: (!) 122/41 (!) 117/43 (!) 130/49 (!) 123/91  Pulse:      Resp: 14 17 17  (!) 21  Temp:      TempSrc:      SpO2:      Weight:      Height:       General exam: Alert, awake, oriented x 3; in no acute distress. Respiratory system: Clear to auscultation. Respiratory effort normal.  Good saturation on room air. Cardiovascular system: Rate controlled and currently sinus; no rubs, no gallops, no JVD. Gastrointestinal system: Abdomen is mildly obese, nondistended, soft and nontender. No organomegaly or masses felt. Normal bowel sounds heard. Central nervous system:  No focal neurological deficits. Extremities: No cyanosis or clubbing. Skin: No petechiae. Psychiatry: Judgement and insight appear normal. Mood & affect appropriate.    Latest data Reviewed: TSH: 0.866 T4, free: 0.99 MRSA screening: Negative Troponin: 6  >>  47. CBC: WBCs 8.0, hemoglobin 14.6 and platelet count 292K Basic metabolic panel: Sodium 138, potassium 3.9, chloride 104, bicarb 22, BUN 23, creatinine 0.93 and GFR >60  Family Communication: Daughter at bedside.  Disposition: Status is: Observation The patient remains OBS appropriate and will d/c before 2 midnights.  Anticipating dischargeHome once medically stable.  Time spent: 50 minutes  Author: Justina Oman, MD 02/01/2024 10:50 AM  For on call review [email or web address].

## 2024-02-01 NOTE — Progress Notes (Signed)
   02/01/24 0835  TOC Brief Assessment  Insurance and Status Reviewed  Patient has primary care physician Yes  Home environment has been reviewed Apartment  Prior level of function: Independent  Prior/Current Home Services No current home services  Social Drivers of Health Review SDOH reviewed no interventions necessary  Readmission risk has been reviewed Yes  Transition of care needs no transition of care needs at this time   Transition of Care Department [facility]) has reviewed patient and no TOC needs have been identified at this time. We will continue to monitor patient advancement through interdisciplinary progression rounds. If new patient transition needs arise, please place a TOC consult.

## 2024-02-01 NOTE — Care Management Obs Status (Signed)
 MEDICARE OBSERVATION STATUS NOTIFICATION   Patient Details  Name: Julie Miles MRN: [id number] Date of Birth: 06-02-1951   Medicare Observation Status Notification Given:   Yes    Lynda Sands, RN 02/01/2024, 7:38 AM

## 2024-02-01 NOTE — Progress Notes (Signed)
*  PRELIMINARY RESULTS* Echocardiogram 2D Echocardiogram has been performed.  Bernis Brisker 02/01/2024, 11:43 AM

## 2024-02-01 NOTE — Plan of Care (Signed)

## 2024-02-02 ENCOUNTER — Other Ambulatory Visit (HOSPITAL_COMMUNITY): Payer: Self-pay

## 2024-02-02 ENCOUNTER — Other Ambulatory Visit: Payer: Self-pay

## 2024-02-02 DIAGNOSIS — R931 Abnormal findings on diagnostic imaging of heart and coronary circulation: Secondary | ICD-10-CM | POA: Diagnosis not present

## 2024-02-02 DIAGNOSIS — E7849 Other hyperlipidemia: Secondary | ICD-10-CM | POA: Diagnosis not present

## 2024-02-02 DIAGNOSIS — I480 Paroxysmal atrial fibrillation: Secondary | ICD-10-CM | POA: Diagnosis not present

## 2024-02-02 DIAGNOSIS — I4891 Unspecified atrial fibrillation: Secondary | ICD-10-CM

## 2024-02-02 LAB — BASIC METABOLIC PANEL WITH GFR
Anion gap: 7 (ref 5–15)
BUN: 19 mg/dL (ref 8–23)
CO2: 24 mmol/L (ref 22–32)
Calcium: 8.5 mg/dL — ABNORMAL LOW (ref 8.9–10.3)
Chloride: 107 mmol/L (ref 98–111)
Creatinine, Ser: 0.72 mg/dL (ref 0.44–1.00)
GFR, Estimated: >60 mL/min (ref >60)
Glucose, Bld: 109 mg/dL — ABNORMAL HIGH (ref 70–99)
Potassium: 4 mmol/L (ref 3.5–5.1)
Sodium: 138 mmol/L (ref 135–145)

## 2024-02-02 LAB — MAGNESIUM: Magnesium: 2.1 mg/dL (ref 1.7–2.4)

## 2024-02-02 MED ORDER — METOPROLOL TARTRATE 25 MG PO TABS: 25.0000 mg | ORAL_TABLET | Freq: Two times a day (BID) | ORAL | 1 refills | Status: DC

## 2024-02-02 MED ORDER — APIXABAN 5 MG PO TABS: 5.0000 mg | ORAL_TABLET | Freq: Two times a day (BID) | ORAL | 2 refills | Status: DC

## 2024-02-02 MED ORDER — AMIODARONE HCL 200 MG PO TABS: 200.0000 mg | ORAL_TABLET | Freq: Every day | ORAL | 0 refills | Status: DC

## 2024-02-02 NOTE — Plan of Care (Signed)

## 2024-02-02 NOTE — Consult Note (Signed)
 CARDIOLOGY CONSULT NOTE    Patient ID: Julie Miles; [id number]; 08-13-1951   Admit date: 01/31/2024 Date of Consult: 02/02/2024  Primary Care Provider: Minus Amel, MD Primary Cardiologist:  Primary Electrophysiologist:     Patient Profile:   Julie Miles is a 73 y.o. female with a hx of HLD, hypothyroidism who is being seen today for the evaluation of atrial fibrillation with RVR at the request of Dr Mason Sole.  History of Present Illness:   Julie Miles is a 73 year old F known to have HLD, hypothyroidism presented to the ER with chest heaviness.  She was having intermittent palpitations for the last 2 months and EKG upon arrival showed atrial fibrillation with RVR.  Initially started on Cardizem but had to be switched to amiodarone drip due to intolerance.  Converted to NSR on amiodarone drip.  Currently on p.o amiodarone 200 mg once daily, metoprolol  tartrate 25 mg twice daily and Eliquis 5 mg twice daily.  Chest pain-free after arrival to the ER.  No other symptoms.  Telemetry reviewed, in NSR.  CT cardiac was performed in 2023 that showed aorta coronary calcium  score of 540, 94th percentile for age and sex matched control.  Mild to moderate nonobstructive CAD, CAD RADS 3.  Echocardiogram this admission showed normal LVEF, normal RV function and no valvular heart disease.  Past Medical History:  Diagnosis Date   Anxiety    History of blood transfusion 1975   at [facility] after vag delivery   History of kidney stones    Hypercholesterolemia    Hypothyroidism    MRSA (methicillin resistant Staphylococcus aureus) 03/08/2020   confirmed by PCR   PONV (postoperative nausea and vomiting)     Past Surgical History:  Procedure Laterality Date   ANTERIOR AND POSTERIOR REPAIR  06/24/2012   Procedure: ANTERIOR (CYSTOCELE) AND POSTERIOR REPAIR (RECTOCELE);  Surgeon: Oddis Bench, MD;  Location: [facility];  Service: Gynecology;  Laterality: N/A;  anterior repair    APPENDECTOMY     BLADDER SUSPENSION  06/24/2012   Procedure: TRANSVAGINAL TAPE (TVT) PROCEDURE;  Surgeon: Oddis Bench, MD;  Location: [facility];  Service: Gynecology;  Laterality: N/A;   CHOLECYSTECTOMY     CYSTOSCOPY  06/24/2012   Procedure: CYSTOSCOPY;  Surgeon: Oddis Bench, MD;  Location: [facility];  Service: Gynecology;  Laterality: N/A;   FOOT SURGERY Right    KNEE ARTHROSCOPY W/ MENISCAL REPAIR Right    LAPAROSCOPY  06/24/2012   Procedure: LAPAROSCOPY OPERATIVE;  Surgeon: Oddis Bench, MD;  Location: [facility];  Service: Gynecology;  Laterality: N/A;   TOTAL KNEE ARTHROPLASTY Right 03/13/2020   Procedure: RIGHT TOTAL KNEE ARTHROPLASTY;  Surgeon: Wes Hamman, MD;  Location: [facility];  Service: Orthopedics;  Laterality: Right;   TUBAL LIGATION     VAGINAL HYSTERECTOMY  06/24/2012   Procedure: HYSTERECTOMY VAGINAL;  Surgeon: Oddis Bench, MD;  Location: [facility];  Service: Gynecology;  Laterality: N/A;       Inpatient Medications: Scheduled Meds:  ALPRAZolam   1 mg Oral QHS   amiodarone  200 mg Oral Daily   apixaban  5 mg Oral BID   Chlorhexidine  Gluconate Cloth  6 each Topical Daily   levothyroxine   50 mcg Oral Q0600   metoprolol  tartrate  25 mg Oral BID   pantoprazole  40 mg Oral Daily   rosuvastatin   10 mg Oral QHS   Continuous Infusions:  PRN Meds:   Allergies:    Allergies  Allergen  Reactions   Codeine Nausea And Vomiting    Social History:   Social History   Socioeconomic History   Marital status: Divorced    Spouse name: Not on file   Number of children: Not on file   Years of education: Not on file   Highest education level: Not on file  Occupational History   Not on file  Tobacco Use   Smoking status: Former    Current packs/day: 0.00    Types: Cigarettes    Quit date: 2006    Years since quitting: 19.3   Smokeless tobacco: Never   Tobacco comments:    STATES SHE DOES NOT SMOKE.  Vaping Use   Vaping status: Never Used  Substance and  Sexual Activity   Alcohol use: No   Drug use: No   Sexual activity: Not on file  Other Topics Concern   Not on file  Social History Narrative   Not on file   Social Drivers of Health   Financial Resource Strain: Not on file  Food Insecurity: No Food Insecurity (01/31/2024)   Hunger Vital Sign    Worried About Running Out of Food in the Last Year: Never true    Ran Out of Food in the Last Year: Never true  Transportation Needs: No Transportation Needs (01/31/2024)   PRAPARE - Administrator, Civil Service (Medical): No    Lack of Transportation (Non-Medical): No  Physical Activity: Not on file  Stress: Not on file  Social Connections: Moderately Isolated (01/31/2024)   Social Connection and Isolation Panel [NHANES]    Frequency of Communication with Friends and Family: More than three times a week    Frequency of Social Gatherings with Friends and Family: Once a week    Attends Religious Services: More than 4 times per year    Active Member of [employer] or Organizations: No    Attends Banker Meetings: Never    Marital Status: Divorced  Catering manager Violence: Not At Risk (01/31/2024)   Humiliation, Afraid, Rape, and Kick questionnaire    Fear of Current or Ex-Partner: No    Emotionally Abused: No    Physically Abused: No    Sexually Abused: No    Family History:    Family History  Problem Relation Age of Onset   Heart disease Mother    Diabetes Mother      ROS:  Please see the history of present illness.  ROS  All other ROS reviewed and negative.     Physical Exam/Data:   Vitals:   02/02/24 0700 02/02/24 0730 02/02/24 0758 02/02/24 0802  BP: (!) 133/39   (!) 137/45  Pulse: (!) 52 (!) 55    Resp:      Temp:   97.9 F (36.6 C)   TempSrc:   Oral   SpO2: 100% 99%    Weight:      Height:        Intake/Output Summary (Last 24 hours) at 02/02/2024 0856 Last data filed at 02/02/2024 0758 Gross per 24 hour  Intake 265.06 ml  Output --  Net  265.06 ml   Filed Weights   01/31/24 2055 01/31/24 2202  Weight: 97.5 kg 94.9 kg   Body mass index is 34.82 kg/m.  General:  Well nourished, well developed, in no acute distress HEENT: normal Lymph: no adenopathy Neck: no JVD Endocrine:  No thryomegaly Vascular: No carotid bruits; FA pulses 2+ bilaterally without bruits  Cardiac:  normal S1, S2;  RRR; no murmur  Lungs:  clear to auscultation bilaterally, no wheezing, rhonchi or rales  Abd: soft, nontender, no hepatomegaly  Ext: no edema Musculoskeletal:  No deformities, BUE and BLE strength normal and equal Skin: warm and dry  Neuro:  CNs 2-12 intact, no focal abnormalities noted Psych:  Normal affect   EKG:  The EKG was personally reviewed and demonstrates:   Telemetry:  Telemetry was personally reviewed and demonstrates:    Relevant CV Studies:   Laboratory Data:  Chemistry Recent Labs  Lab 01/31/24 1919 02/02/24 0435  NA 138 138  K 3.9 4.0  CL 104 107  CO2 22 24  GLUCOSE 162* 109*  BUN 23 19  CREATININE 0.93 0.72  CALCIUM  9.1 8.5*  GFRNONAA >60 >60  ANIONGAP 12 7    No results for input(s): "PROT", "ALBUMIN", "AST", "ALT", "ALKPHOS", "BILITOT" in the last 168 hours. Hematology Recent Labs  Lab 01/31/24 1919  WBC 8.0  RBC 4.73  HGB 14.6  HCT 42.6  MCV 90.1  MCH 30.9  MCHC 34.3  RDW 12.0  PLT 292   Cardiac EnzymesNo results for input(s): "TROPONINI" in the last 168 hours. No results for input(s): "TROPIPOC" in the last 168 hours.  BNPNo results for input(s): "BNP", "PROBNP" in the last 168 hours.  DDimer No results for input(s): "DDIMER" in the last 168 hours.  Radiology/Studies:  ECHOCARDIOGRAM COMPLETE Result Date: 02/01/2024    ECHOCARDIOGRAM REPORT   Patient Name:   SYRAH DINNEEN Date of Exam: 02/01/2024 Medical Rec #:  [id number]         Height:       65.0 in Accession #:    [id number]        Weight:       209.2 lb Date of Birth:  06-26-1951         BSA:          2.016 m Patient Age:    72  years          BP:           110/64 mmHg Patient Gender: F                 HR:           58 bpm. Exam Location:  [facility] Procedure: 2D Echo, Cardiac Doppler and Color Doppler (Both Spectral and Color            Flow Doppler were utilized during procedure). Indications:    Atrial Fibrillation I48.91  History:        Patient has prior history of Echocardiogram examinations, most                 recent 01/25/2022. Arrythmias:Atrial Fibrillation; Risk                 Factors:Family History of Coronary Artery Disease and                 Dyslipidemia.  Sonographer:    Denese Finn RCS Referring Phys: [id number] OLADAPO ADEFESO IMPRESSIONS  1. Left ventricular ejection fraction, by estimation, is 65 to 70%. The left ventricle has normal function. The left ventricle has no regional wall motion abnormalities. Left ventricular diastolic parameters are indeterminate.  2. Right ventricular systolic function is normal. The right ventricular size is normal. Tricuspid regurgitation signal is inadequate for assessing PA pressure.  3. The mitral valve is normal in structure. No evidence of mitral valve regurgitation. No evidence of mitral stenosis.  4. The aortic valve is tricuspid. There is mild calcification of the aortic valve. There is mild thickening of the aortic valve. Aortic valve regurgitation is not visualized. No aortic stenosis is present. FINDINGS  Left Ventricle: Left ventricular ejection fraction, by estimation, is 65 to 70%. The left ventricle has normal function. The left ventricle has no regional wall motion abnormalities. The left ventricular internal cavity size was normal in size. There is  no left ventricular hypertrophy. Left ventricular diastolic parameters are indeterminate. Right Ventricle: The right ventricular size is normal. Right vetricular wall thickness was not well visualized. Right ventricular systolic function is normal. Tricuspid regurgitation signal is inadequate for assessing PA pressure. Left  Atrium: Left atrial size was normal in size. Right Atrium: Right atrial size was normal in size. Pericardium: There is no evidence of pericardial effusion. Mitral Valve: The mitral valve is normal in structure. There is mild thickening of the mitral valve leaflet(s). There is mild calcification of the mitral valve leaflet(s). Mild mitral annular calcification. No evidence of mitral valve regurgitation. No evidence of mitral valve stenosis. Tricuspid Valve: The tricuspid valve is normal in structure. Tricuspid valve regurgitation is trivial. No evidence of tricuspid stenosis. Aortic Valve: The aortic valve is tricuspid. There is mild calcification of the aortic valve. There is mild thickening of the aortic valve. There is mild aortic valve annular calcification. Aortic valve regurgitation is not visualized. No aortic stenosis  is present. Aortic valve mean gradient measures 6.0 mmHg. Aortic valve peak gradient measures 9.1 mmHg. Aortic valve area, by VTI measures 1.74 cm. Pulmonic Valve: The pulmonic valve was not well visualized. Pulmonic valve regurgitation is not visualized. No evidence of pulmonic stenosis. Aorta: The aortic root is normal in size and structure and the ascending aorta was not well visualized. IAS/Shunts: No atrial level shunt detected by color flow Doppler.  LEFT VENTRICLE PLAX 2D LVIDd:         4.10 cm   Diastology LVIDs:         2.00 cm   LV e' medial:    7.18 cm/s LV PW:         0.80 cm   LV E/e' medial:  16.4 LV IVS:        0.90 cm   LV e' lateral:   8.81 cm/s LVOT diam:     1.80 cm   LV E/e' lateral: 13.4 LV SV:         81 LV SV Index:   40 LVOT Area:     2.54 cm  RIGHT VENTRICLE RV S prime:     8.05 cm/s TAPSE (M-mode): 1.6 cm LEFT ATRIUM             Index        RIGHT ATRIUM           Index LA diam:        3.40 cm 1.69 cm/m   RA Area:     18.00 cm LA Vol (A2C):   54.5 ml 27.03 ml/m  RA Volume:   49.10 ml  24.35 ml/m LA Vol (A4C):   40.6 ml 20.14 ml/m LA Biplane Vol: 47.0 ml 23.31  ml/m  AORTIC VALVE AV Area (Vmax):    2.11 cm AV Area (Vmean):   1.80 cm AV Area (VTI):     1.74 cm AV Vmax:           151.00 cm/s AV Vmean:          114.000 cm/s AV  VTI:            0.464 m AV Peak Grad:      9.1 mmHg AV Mean Grad:      6.0 mmHg LVOT Vmax:         125.00 cm/s LVOT Vmean:        80.600 cm/s LVOT VTI:          0.317 m LVOT/AV VTI ratio: 0.68  AORTA Ao Root diam: 3.30 cm MITRAL VALVE MV Area (PHT): 3.27 cm     SHUNTS MV Decel Time: 232 msec     Systemic VTI:  0.32 m MV E velocity: 118.00 cm/s  Systemic Diam: 1.80 cm MV A velocity: 73.70 cm/s MV E/A ratio:  1.60 Armida Lander MD Electronically signed by Armida Lander MD Signature Date/Time: 02/01/2024/5:46:09 PM    Final    DG Chest Port 1 View Result Date: 01/31/2024 CLINICAL DATA:  Chest pain EXAM: PORTABLE CHEST 1 VIEW COMPARISON:  Chest x-ray 09/21/2023. FINDINGS: The heart size and mediastinal contours are within normal limits. Both lungs are clear. No visible pleural effusions or pneumothorax. No acute osseous abnormality. IMPRESSION: No active disease. Electronically Signed   By: Stevenson Elbe M.D.   On: 01/31/2024 19:33    Assessment and Plan:   Atrial fibrillation with RVR, new onset: Presented with chest heaviness and intermittent palpitations for the last 2 months.  EKG upon arrival showed A-fib with RVR.  Failed Cardizem drip, switched to amiodarone drip and converted to NSR.  Telemetry in the last 24 hours showed NSR. Currently on p.o. amiodarone 200 mg once daily.  Will continue p.o. amiodarone 200 mg once daily for total duration of 1 month (due to history of hypothyroidism), continue metoprolol  tartrate 25 mg twice daily and Eliquis 5 mg twice daily.  Echo on admission showed normal LVEF, normal RV function and no valvular heart disease.  Outpatient OSA evaluation, if not tested before.  Elevated coronary calcium  score 540, 94 percentile for age and sex matched control: Not on aspirin  due to Eliquis use, continue  rosuvastatin  10 mg nightly.  Goal LDL less than 70.   [facility] will sign off.   Medication Recommendations: Metoprolol  tartrate 25 mg twice daily, amiodarone 200 mg once daily (for total duration of 1 month), Eliquis 5 mg twice daily, rosuvastatin  10 mg nightly. Other recommendations (labs, testing, etc): TSH prior to cardiology visit Follow up as an outpatient: Follow-up with cardiology in 2 months upon discharge.    For questions or updates, please contact [facility] Please consult [email or web address] for contact info under Cardiology/STEMI.   Signed, Carnell Beavers Priya Magali Bray, MD 02/02/2024 8:56 AM

## 2024-02-02 NOTE — Discharge Summary (Signed)
 Physician Discharge Summary  Julie Miles:[id number] DOB: [birth year] DOA: 01/31/2024  PCP: Minus Amel, MD  Admit date: 01/31/2024  Discharge date: 02/02/2024  Admitted From:Home  Disposition:  Home  Recommendations for Outpatient Follow-up:  Follow up with PCP in 1-2 weeks Please obtain TSH level prior to cardiology appointment with follow-up scheduled for 2 months from now Continue on amiodarone 200 mg daily as prescribed for 1 month Continue on metoprolol  25 mg twice daily Continue Eliquis for anticoagulation Continue rosuvastatin   Home Health: None  Equipment/Devices: None  Discharge Condition:Stable  CODE STATUS: Full  Diet recommendation: Heart Healthy  Brief/Interim Summary: Julie Miles is a 73 y.o. female with medical history significant of hypothyroidism, hyperlipidemia, anxiety who presents to the emergency department from home due to sensation of chest heaviness today.  She complains of about 2 months of intermittent irregular heart rhythms or rapid heart rate/palpitations without chest pain, shortness of breath, nausea, vomiting, abdominal pain.  Patient was admitted for paroxysmal atrial fibrillation with RVR and initially failed IV Cardizem and ended up on amiodarone drip.  She is now on oral amiodarone and has converted to sinus rhythm and also remains on metoprolol  as well as Eliquis for anticoagulation.  She was seen by cardiology with recommendations to remain on medications as noted above with follow-up in 2 months as well as need for TSH prior to follow-up.  No other acute events or concerns noted and she is eager for discharge.  Discharge Diagnoses:  Principal Problem:   Paroxysmal atrial fibrillation with RVR (HCC) Active Problems:   Anxiety   Mixed hyperlipidemia   Acquired hypothyroidism  Principal discharge diagnosis: New onset atrial fibrillation with RVR.  Discharge Instructions  Discharge Instructions     Diet - low sodium heart  healthy   Complete by: As directed    Increase activity slowly   Complete by: As directed       Allergies as of 02/02/2024       Reactions   Codeine Nausea And Vomiting        Medication List     TAKE these medications    albuterol  108 (90 Base) MCG/ACT inhaler Commonly known as: VENTOLIN  HFA Inhale 2 puffs into the lungs every 4 (four) hours as needed. What changed: reasons to take this   ALPRAZolam  1 MG tablet Commonly known as: XANAX  Take 1 mg by mouth at bedtime.   amiodarone 200 MG tablet Commonly known as: PACERONE Take 1 tablet (200 mg total) by mouth daily. Start taking on: Feb 03, 2024   apixaban 5 MG Tabs tablet Commonly known as: ELIQUIS Take 1 tablet (5 mg total) by mouth 2 (two) times daily.   levothyroxine  50 MCG tablet Commonly known as: SYNTHROID  Take 50 mcg by mouth daily before breakfast.   metoprolol  tartrate 25 MG tablet Commonly known as: LOPRESSOR  Take 1 tablet (25 mg total) by mouth 2 (two) times daily.   rosuvastatin  10 MG tablet Commonly known as: CRESTOR  Take 10 mg by mouth at bedtime.        Follow-up Information     Minus Amel, MD. Schedule an appointment as soon as possible for a visit in 1 week(s).   Specialty: Family Medicine Contact information: [address] [place] [address] [phone]         [facility]. Go in 2 month(s).   Specialty: Cardiology Contact information: [address] [place]   [phone]  Allergies  Allergen Reactions   Codeine Nausea And Vomiting    Consultations: Cardiology   Procedures/Studies: ECHOCARDIOGRAM COMPLETE Result Date: 02/01/2024    ECHOCARDIOGRAM REPORT   Patient Name:   Julie Miles Date of Exam: 02/01/2024 Medical Rec #:  [id number]         Height:       65.0 in Accession #:    [id number]        Weight:       209.2 lb Date of Birth:  [birth year]         BSA:          2.016 m Patient Age:    72  years          BP:           110/64 mmHg Patient Gender: F                 HR:           58 bpm. Exam Location:  [facility] Procedure: 2D Echo, Cardiac Doppler and Color Doppler (Both Spectral and Color            Flow Doppler were utilized during procedure). Indications:    Atrial Fibrillation I48.91  History:        Patient has prior history of Echocardiogram examinations, most                 recent 01/25/2022. Arrythmias:Atrial Fibrillation; Risk                 Factors:Family History of Coronary Artery Disease and                 Dyslipidemia.  Sonographer:    Denese Finn RCS Referring Phys: [id number] OLADAPO ADEFESO IMPRESSIONS  1. Left ventricular ejection fraction, by estimation, is 65 to 70%. The left ventricle has normal function. The left ventricle has no regional wall motion abnormalities. Left ventricular diastolic parameters are indeterminate.  2. Right ventricular systolic function is normal. The right ventricular size is normal. Tricuspid regurgitation signal is inadequate for assessing PA pressure.  3. The mitral valve is normal in structure. No evidence of mitral valve regurgitation. No evidence of mitral stenosis.  4. The aortic valve is tricuspid. There is mild calcification of the aortic valve. There is mild thickening of the aortic valve. Aortic valve regurgitation is not visualized. No aortic stenosis is present. FINDINGS  Left Ventricle: Left ventricular ejection fraction, by estimation, is 65 to 70%. The left ventricle has normal function. The left ventricle has no regional wall motion abnormalities. The left ventricular internal cavity size was normal in size. There is  no left ventricular hypertrophy. Left ventricular diastolic parameters are indeterminate. Right Ventricle: The right ventricular size is normal. Right vetricular wall thickness was not well visualized. Right ventricular systolic function is normal. Tricuspid regurgitation signal is inadequate for assessing PA pressure. Left  Atrium: Left atrial size was normal in size. Right Atrium: Right atrial size was normal in size. Pericardium: There is no evidence of pericardial effusion. Mitral Valve: The mitral valve is normal in structure. There is mild thickening of the mitral valve leaflet(s). There is mild calcification of the mitral valve leaflet(s). Mild mitral annular calcification. No evidence of mitral valve regurgitation. No evidence of mitral valve stenosis. Tricuspid Valve: The tricuspid valve is normal in structure. Tricuspid valve regurgitation is trivial. No evidence of tricuspid stenosis. Aortic Valve: The aortic valve is tricuspid. There is mild calcification of the  aortic valve. There is mild thickening of the aortic valve. There is mild aortic valve annular calcification. Aortic valve regurgitation is not visualized. No aortic stenosis  is present. Aortic valve mean gradient measures 6.0 mmHg. Aortic valve peak gradient measures 9.1 mmHg. Aortic valve area, by VTI measures 1.74 cm. Pulmonic Valve: The pulmonic valve was not well visualized. Pulmonic valve regurgitation is not visualized. No evidence of pulmonic stenosis. Aorta: The aortic root is normal in size and structure and the ascending aorta was not well visualized. IAS/Shunts: No atrial level shunt detected by color flow Doppler.  LEFT VENTRICLE PLAX 2D LVIDd:         4.10 cm   Diastology LVIDs:         2.00 cm   LV e' medial:    7.18 cm/s LV PW:         0.80 cm   LV E/e' medial:  16.4 LV IVS:        0.90 cm   LV e' lateral:   8.81 cm/s LVOT diam:     1.80 cm   LV E/e' lateral: 13.4 LV SV:         81 LV SV Index:   40 LVOT Area:     2.54 cm  RIGHT VENTRICLE RV S prime:     8.05 cm/s TAPSE (M-mode): 1.6 cm LEFT ATRIUM             Index        RIGHT ATRIUM           Index LA diam:        3.40 cm 1.69 cm/m   RA Area:     18.00 cm LA Vol (A2C):   54.5 ml 27.03 ml/m  RA Volume:   49.10 ml  24.35 ml/m LA Vol (A4C):   40.6 ml 20.14 ml/m LA Biplane Vol: 47.0 ml 23.31  ml/m  AORTIC VALVE AV Area (Vmax):    2.11 cm AV Area (Vmean):   1.80 cm AV Area (VTI):     1.74 cm AV Vmax:           151.00 cm/s AV Vmean:          114.000 cm/s AV VTI:            0.464 m AV Peak Grad:      9.1 mmHg AV Mean Grad:      6.0 mmHg LVOT Vmax:         125.00 cm/s LVOT Vmean:        80.600 cm/s LVOT VTI:          0.317 m LVOT/AV VTI ratio: 0.68  AORTA Ao Root diam: 3.30 cm MITRAL VALVE MV Area (PHT): 3.27 cm     SHUNTS MV Decel Time: 232 msec     Systemic VTI:  0.32 m MV E velocity: 118.00 cm/s  Systemic Diam: 1.80 cm MV A velocity: 73.70 cm/s MV E/A ratio:  1.60 Armida Lander MD Electronically signed by Armida Lander MD Signature Date/Time: 02/01/2024/5:46:09 PM    Final    DG Chest Port 1 View Result Date: 01/31/2024 CLINICAL DATA:  Chest pain EXAM: PORTABLE CHEST 1 VIEW COMPARISON:  Chest x-ray 09/21/2023. FINDINGS: The heart size and mediastinal contours are within normal limits. Both lungs are clear. No visible pleural effusions or pneumothorax. No acute osseous abnormality. IMPRESSION: No active disease. Electronically Signed   By: Stevenson Elbe M.D.   On: 01/31/2024 19:33     Discharge  Exam: Vitals:   02/02/24 0758 02/02/24 0802  BP:  (!) 137/45  Pulse:    Resp:    Temp: 97.9 F (36.6 C)   SpO2:     Vitals:   02/02/24 0700 02/02/24 0730 02/02/24 0758 02/02/24 0802  BP: (!) 133/39   (!) 137/45  Pulse: (!) 52 (!) 55    Resp:      Temp:   97.9 F (36.6 C)   TempSrc:   Oral   SpO2: 100% 99%    Weight:      Height:        General: Pt is alert, awake, not in acute distress Cardiovascular: RRR, S1/S2 +, no rubs, no gallops Respiratory: CTA bilaterally, no wheezing, no rhonchi Abdominal: Soft, NT, ND, bowel sounds + Extremities: no edema, no cyanosis    The results of significant diagnostics from this hospitalization (including imaging, microbiology, ancillary and laboratory) are listed below for reference.     Microbiology: Recent Results (from the past  240 hours)  MRSA Next Gen by PCR, Nasal     Status: None   Collection Time: 01/31/24  8:21 PM   Specimen: Nasal Mucosa; Nasal Swab  Result Value Ref Range Status   MRSA by PCR Next Gen NOT DETECTED NOT DETECTED Final    Comment: (NOTE) The GeneXpert MRSA Assay (FDA approved for NASAL specimens only), is one component of a comprehensive MRSA colonization surveillance program. It is not intended to diagnose MRSA infection nor to guide or monitor treatment for MRSA infections. Test performance is not FDA approved in patients less than 35 years old. Performed at [facility], [address], [place], [place] [address]      Labs: BNP (last 3 results) No results for input(s): "BNP" in the last 8760 hours. Basic Metabolic Panel: Recent Labs  Lab 01/31/24 1919 02/02/24 0435  NA 138 138  K 3.9 4.0  CL 104 107  CO2 22 24  GLUCOSE 162* 109*  BUN 23 19  CREATININE 0.93 0.72  CALCIUM  9.1 8.5*  MG  --  2.1   Liver Function Tests: No results for input(s): "AST", "ALT", "ALKPHOS", "BILITOT", "PROT", "ALBUMIN" in the last 168 hours. No results for input(s): "LIPASE", "AMYLASE" in the last 168 hours. No results for input(s): "AMMONIA" in the last 168 hours. CBC: Recent Labs  Lab 01/31/24 1919  WBC 8.0  HGB 14.6  HCT 42.6  MCV 90.1  PLT 292   Cardiac Enzymes: No results for input(s): "CKTOTAL", "CKMB", "CKMBINDEX", "TROPONINI" in the last 168 hours. BNP: Invalid input(s): "POCBNP" CBG: No results for input(s): "GLUCAP" in the last 168 hours. D-Dimer No results for input(s): "DDIMER" in the last 72 hours. Hgb A1c No results for input(s): "HGBA1C" in the last 72 hours. Lipid Profile No results for input(s): "CHOL", "HDL", "LDLCALC", "TRIG", "CHOLHDL", "LDLDIRECT" in the last 72 hours. Thyroid  function studies Recent Labs    01/31/24 1919  TSH 0.866   Anemia work up No results for input(s): "VITAMINB12", "FOLATE", "FERRITIN", "TIBC", "IRON", "RETICCTPCT" in the last 72  hours. Urinalysis    Component Value Date/Time   COLORURINE YELLOW 03/08/2020 1050   APPEARANCEUR CLEAR 03/08/2020 1050   LABSPEC 1.020 03/08/2020 1050   PHURINE 5.0 03/08/2020 1050   GLUCOSEU NEGATIVE 03/08/2020 1050   HGBUR SMALL (A) 03/08/2020 1050   BILIRUBINUR small (A) 01/18/2023 1528   KETONESUR trace (5) (A) 01/18/2023 1528   KETONESUR NEGATIVE 03/08/2020 1050   PROTEINUR trace (A) 01/18/2023 1528   PROTEINUR NEGATIVE  03/08/2020 1050   UROBILINOGEN 0.2 01/18/2023 1528   UROBILINOGEN 0.2 11/10/2014 1218   NITRITE Negative 01/18/2023 1528   NITRITE NEGATIVE 03/08/2020 1050   LEUKOCYTESUR Small (1+) (A) 01/18/2023 1528   LEUKOCYTESUR NEGATIVE 03/08/2020 1050   Sepsis Labs Recent Labs  Lab 01/31/24 1919  WBC 8.0   Microbiology Recent Results (from the past 240 hours)  MRSA Next Gen by PCR, Nasal     Status: None   Collection Time: 01/31/24  8:21 PM   Specimen: Nasal Mucosa; Nasal Swab  Result Value Ref Range Status   MRSA by PCR Next Gen NOT DETECTED NOT DETECTED Final    Comment: (NOTE) The GeneXpert MRSA Assay (FDA approved for NASAL specimens only), is one component of a comprehensive MRSA colonization surveillance program. It is not intended to diagnose MRSA infection nor to guide or monitor treatment for MRSA infections. Test performance is not FDA approved in patients less than 54 years old. Performed at [facility], [address] [place], [place] [address]      Time coordinating discharge: 35 minutes  SIGNED:   Cornelius Dill, DO [facility] 02/02/2024, 9:11 AM  If 7PM-7AM, please contact night-coverage [email or web address]

## 2024-02-06 DIAGNOSIS — Z6835 Body mass index (BMI) 35.0-35.9, adult: Secondary | ICD-10-CM | POA: Diagnosis not present

## 2024-02-06 DIAGNOSIS — I4891 Unspecified atrial fibrillation: Secondary | ICD-10-CM | POA: Diagnosis not present

## 2024-02-06 DIAGNOSIS — E6609 Other obesity due to excess calories: Secondary | ICD-10-CM | POA: Diagnosis not present

## 2024-03-20 ENCOUNTER — Emergency Department (HOSPITAL_COMMUNITY)
Admission: EM | Admit: 2024-03-20 | Discharge: 2024-03-20 | Disposition: A | Discharge status: Home / Self Care | Attending: Emergency Medicine | Admitting: Emergency Medicine

## 2024-03-20 ENCOUNTER — Emergency Department (HOSPITAL_COMMUNITY)

## 2024-03-20 ENCOUNTER — Ambulatory Visit: Admission: EM | Admit: 2024-03-20 | Discharge: 2024-03-20 | Disposition: A | Discharge status: Home / Self Care

## 2024-03-20 ENCOUNTER — Other Ambulatory Visit: Payer: Self-pay

## 2024-03-20 DIAGNOSIS — Y9339 Activity, other involving climbing, rappelling and jumping off: Secondary | ICD-10-CM | POA: Diagnosis not present

## 2024-03-20 DIAGNOSIS — Z7901 Long term (current) use of anticoagulants: Secondary | ICD-10-CM

## 2024-03-20 DIAGNOSIS — R55 Syncope and collapse: Secondary | ICD-10-CM

## 2024-03-20 DIAGNOSIS — S62627A Displaced fracture of medial phalanx of left little finger, initial encounter for closed fracture: Secondary | ICD-10-CM | POA: Diagnosis not present

## 2024-03-20 DIAGNOSIS — S62625A Displaced fracture of medial phalanx of left ring finger, initial encounter for closed fracture: Secondary | ICD-10-CM | POA: Diagnosis not present

## 2024-03-20 DIAGNOSIS — S62615A Displaced fracture of proximal phalanx of left ring finger, initial encounter for closed fracture: Secondary | ICD-10-CM | POA: Diagnosis not present

## 2024-03-20 DIAGNOSIS — S62613A Displaced fracture of proximal phalanx of left middle finger, initial encounter for closed fracture: Secondary | ICD-10-CM | POA: Diagnosis not present

## 2024-03-20 DIAGNOSIS — Y92009 Unspecified place in unspecified non-institutional (private) residence as the place of occurrence of the external cause: Secondary | ICD-10-CM | POA: Diagnosis not present

## 2024-03-20 DIAGNOSIS — S61219A Laceration without foreign body of unspecified finger without damage to nail, initial encounter: Secondary | ICD-10-CM | POA: Diagnosis not present

## 2024-03-20 DIAGNOSIS — S6992XA Unspecified injury of left wrist, hand and finger(s), initial encounter: Secondary | ICD-10-CM | POA: Diagnosis present

## 2024-03-20 DIAGNOSIS — W010XXA Fall on same level from slipping, tripping and stumbling without subsequent striking against object, initial encounter: Secondary | ICD-10-CM | POA: Insufficient documentation

## 2024-03-20 DIAGNOSIS — S61412A Laceration without foreign body of left hand, initial encounter: Secondary | ICD-10-CM | POA: Diagnosis not present

## 2024-03-20 DIAGNOSIS — S62629A Displaced fracture of medial phalanx of unspecified finger, initial encounter for closed fracture: Secondary | ICD-10-CM | POA: Diagnosis not present

## 2024-03-20 DIAGNOSIS — S61213A Laceration without foreign body of left middle finger without damage to nail, initial encounter: Secondary | ICD-10-CM

## 2024-03-20 MED ORDER — ACETAMINOPHEN 500 MG PO TABS
1000.0000 mg | ORAL_TABLET | Freq: Once | ORAL | Status: AC
  Administered 2024-03-20: 1000 mg via ORAL
  Filled 2024-03-20: qty 2

## 2024-03-20 MED ORDER — LIDOCAINE HCL (PF) 1 % IJ SOLN
30.0000 mL | Freq: Once | INTRAMUSCULAR | Status: AC
  Administered 2024-03-20: 30 mL
  Filled 2024-03-20: qty 30

## 2024-03-20 MED ORDER — CEPHALEXIN 500 MG PO CAPS
500.0000 mg | ORAL_CAPSULE | Freq: Four times a day (QID) | ORAL | 0 refills | Status: DC
Start: 2024-03-20 — End: 2024-04-28

## 2024-03-20 MED ORDER — POVIDONE-IODINE 10 % EX SOLN
CUTANEOUS | Status: AC
  Filled 2024-03-20: qty 14.8

## 2024-03-20 MED ORDER — CEPHALEXIN 500 MG PO CAPS
500.0000 mg | ORAL_CAPSULE | Freq: Once | ORAL | Status: AC
  Administered 2024-03-20: 500 mg via ORAL
  Filled 2024-03-20: qty 1

## 2024-03-20 NOTE — ED Notes (Signed)
 Pt came into UC and when walking to triage room pt fainted. Provider notified and team assisted pt to floor and made sure she was alert before moving pt. Provider advised pt to go to ER for further treatment.

## 2024-03-20 NOTE — ED Triage Notes (Signed)
 See at [facility] on [address]. Clemens climbing the steps on the deck, rings on her left hand resulted in lacerations to her L finger finger. Pt is on Eliquis . Has some swelling in that finger, too. Finger is now crooked.

## 2024-03-20 NOTE — Discharge Instructions (Addendum)
 It was a pleasure taking care of you today.  You were evaluated in the ER for left hand injury.  You have fractures in your left middle, ring and little fingers, these are displaced.  You also have a laceration on your hand.  We are putting you on antibiotics to prevent infection, we have put you in a splint.  Make sure you keep the splint on and follow-up closely with the hand specialist.  As you are already established with Dr. Onesimo, and for to follow-up there please call Monday to see if they are able to handle your injury there.  If not we will need close follow-up with Dr. Arlinda.  Come back to the ER if you have new or worsening symptoms.

## 2024-03-20 NOTE — ED Notes (Signed)
 Pt/family received d/c paperwork at this time. After going over the paperwork any questions, comments, or concerns were answered to the best of this nurse's knowledge. The pt/family verbally acknowledged the teachings/instructions.

## 2024-03-20 NOTE — ED Notes (Signed)
 Patient is being discharged from the Urgent Care and sent to the Emergency Department via PV . Per provider KYM Silk, patient is in need of higher level of care due to fainting, and laceration needing further treatment. Patient is aware and verbalizes understanding of plan of care. There were no vitals filed for this visit.

## 2024-03-20 NOTE — ED Provider Notes (Signed)
 [place] EMERGENCY DEPARTMENT AT [facility] Provider Note   CSN: [id number] Arrival date & time: 03/20/24  1356     Patient presents with: Laceration (L hand)   Julie Miles is a 73 y.o. female.  History of A-fib and is on Eliquis , comes to the ER with a left hand injury today.  She was carrying some objects up the stairs at her sister's house and fell forward after tripping, landed with her left hand outstretched on the step in front of her and had pain and swelling of her fingers as well as a laceration on the palm of her hand just proximal to the left middle finger.  She is right-hand dominant.  She reports her tetanus shot is up-to-date.  Went to urgent care first and after looking at her and got very dizzy and had a syncopal event, she did she thinks this was the cause.  Did not hit her head, she was lowered to the ground.  No chest pain or shortness of breath.      Laceration      Prior to Admission medications   Medication Sig Start Date End Date Taking? Authorizing Provider  cephALEXin  (KEFLEX ) 500 MG capsule Take 1 capsule (500 mg total) by mouth 4 (four) times daily. 03/20/24  Yes Gatlin Kittell A, PA-C  albuterol  (VENTOLIN  HFA) 108 (90 Base) MCG/ACT inhaler Inhale 2 puffs into the lungs every 4 (four) hours as needed. Patient taking differently: Inhale 2 puffs into the lungs every 4 (four) hours as needed for wheezing or shortness of breath. 09/14/23   Stuart Vernell Norris, PA-C  ALPRAZolam  (XANAX ) 1 MG tablet Take 1 mg by mouth at bedtime.     [provider]  amiodarone  (PACERONE ) 200 MG tablet Take 1 tablet (200 mg total) by mouth daily. 02/03/24 03/04/24  Maree, Pratik D, DO  apixaban  (ELIQUIS ) 5 MG TABS tablet Take 1 tablet (5 mg total) by mouth 2 (two) times daily. 02/02/24   Maree, Pratik D, DO  levothyroxine  (SYNTHROID , LEVOTHROID) 50 MCG tablet Take 50 mcg by mouth daily before breakfast.    [provider]  metoprolol  tartrate  (LOPRESSOR ) 25 MG tablet Take 1 tablet (25 mg total) by mouth 2 (two) times daily. 02/02/24 04/02/24  Maree, Pratik D, DO  rosuvastatin  (CRESTOR ) 10 MG tablet Take 10 mg by mouth at bedtime. 12/12/23   [provider]    Allergies: Codeine    Review of Systems  Updated Vital Signs BP (!) 163/69 (BP Location: Right Arm)   Pulse (!) 57   Temp 97.7 F (36.5 C) (Tympanic)   Resp 16   Ht 5' 5 (1.651 m)   Wt 94.3 kg   SpO2 93%   BMI 34.61 kg/m   Physical Exam Vitals and nursing note reviewed.  Constitutional:      General: She is not in acute distress.    Appearance: She is well-developed.  HENT:     Head: Normocephalic and atraumatic.     Mouth/Throat:     Mouth: Mucous membranes are moist.   Eyes:     Extraocular Movements: Extraocular movements intact.     Conjunctiva/sclera: Conjunctivae normal.     Pupils: Pupils are equal, round, and reactive to light.    Cardiovascular:     Rate and Rhythm: Normal rate and regular rhythm.     Heart sounds: No murmur heard. Pulmonary:     Effort: Pulmonary effort is normal. No respiratory distress.  Breath sounds: Normal breath sounds.  Abdominal:     Palpations: Abdomen is soft.     Tenderness: There is no abdominal tenderness.   Musculoskeletal:        General: No swelling.     Cervical back: Neck supple.     Comments: Pain and swelling diffusely to left 4th and 5th fingers.  Patient can flex and extend all joints of the left hand, though has pain with flexion to 2nd and 3rd digits at the MCP joint.  No wrist tenderness.  Normal left radial pulse, sensation intact to left hand.   Skin:    General: Skin is warm and dry.     Capillary Refill: Capillary refill takes less than 2 seconds.     Comments: Approximately 3 cm laceration to the palm just proximal to the middle finger of the left hand.  Minimal active bleeding   Neurological:     General: No focal deficit present.     Mental Status: She is alert and oriented to  person, place, and time.   Psychiatric:        Mood and Affect: Mood normal.     (all labs ordered are listed, but only abnormal results are displayed) Labs Reviewed - No data to display  EKG: None  Radiology: DG Hand Complete Left Result Date: 03/20/2024 CLINICAL DATA:  Status post fall with left finger laceration EXAM: LEFT HAND - COMPLETE 3 VIEW COMPARISON:  Left hand radiograph dated 11/28/2021 FINDINGS: Comminuted intra-articular fractures of the middle and ring finger proximal phalangeal bases. Oblique intra-articular fracture of the proximal small finger middle phalanx. These fractures demonstrate moderate apex palmar angulation. Sequela of prior ulnar styloid avulsion fracture. Diffuse soft tissue swelling of the middle, ring, and small fingers. IMPRESSION: 1. Comminuted, angulated, intra-articular fractures of the middle and ring finger proximal phalangeal bases. 2. Angulated, oblique intra-articular fracture of the proximal small finger middle phalanx. Electronically Signed   By: Limin  Xu M.D.   On: 03/20/2024 14:59     .Laceration Repair  Date/Time: 03/20/2024 7:01 PM  Performed by: Suellen Sherran LABOR, PA-C Authorized by: Suellen Sherran LABOR, PA-C   Consent:    Consent obtained:  Verbal   Consent given by:  Patient   Risks discussed:  Infection, pain, poor wound healing and nerve damage Universal protocol:    Patient identity confirmed:  Verbally with patient Anesthesia:    Anesthesia method:  Local infiltration   Local anesthetic:  Lidocaine  1% w/o epi Laceration details:    Location:  Hand   Hand location:  L palm   Length (cm):  3 Pre-procedure details:    Preparation:  Imaging obtained to evaluate for foreign bodies Exploration:    Hemostasis achieved with:  Direct pressure   Imaging obtained: x-ray     Imaging outcome: foreign body not noted     Wound exploration: entire depth of wound visualized     Wound extent: no foreign body, no signs of injury and no  vascular damage     Contaminated: no   Treatment:    Area cleansed with:  Povidone-iodine    Amount of cleaning:  Extensive   Irrigation solution:  Sterile saline   Irrigation method:  Syringe   Visualized foreign bodies/material removed: no     Debridement:  None Skin repair:    Repair method:  Sutures   Suture size:  4-0   Suture material:  Nylon   Suture technique:  Simple interrupted   Number of sutures:  4 Approximation:    Approximation:  Close Repair type:    Repair type:  Simple Post-procedure details:    Dressing: xeroform and gauze.   Procedure completion:  Tolerated well, no immediate complications .Splint Application  Date/Time: 03/20/2024 7:03 PM  Performed by: Suellen Sherran LABOR, PA-C Authorized by: Suellen Sherran LABOR, PA-C   Consent:    Consent obtained:  Verbal   Consent given by:  Patient   Risks discussed:  Discoloration, numbness, pain and swelling Universal protocol:    Procedure explained and questions answered to patient or proxy's satisfaction: yes     Imaging studies available: yes     Patient identity confirmed:  Verbally with patient Pre-procedure details:    Distal neurologic exam:  Normal   Distal perfusion: brisk capillary refill   Procedure details:    Location:  Hand   Hand location:  L hand   Splint type:  Volar short arm   Supplies:  Elastic bandage, cotton padding and fiberglass Post-procedure details:    Distal neurologic exam:  Normal   Distal perfusion: brisk capillary refill     Procedure completion:  Tolerated well, no immediate complications    Medications Ordered in the ED  acetaminophen  (TYLENOL ) tablet 1,000 mg (1,000 mg Oral Given 03/20/24 1508)  povidone-iodine  (BETADINE ) 10 % external solution (  Given 03/20/24 1508)  lidocaine  (PF) (XYLOCAINE ) 1 % injection 30 mL (30 mLs Infiltration Given 03/20/24 1514)  cephALEXin  (KEFLEX ) capsule 500 mg (500 mg Oral Given 03/20/24 1515)                                    Medical  Decision Making Differential diagnosis includes but not limited to fracture, laceration, sprain, strain, dislocation, other  ED course: Patient is here for left hand injury after mechanical fall today.  She is on Eliquis  but had no head injury or loss of consciousness.  Had a brief vagal episode at urgent care.  Presents here for wound management.  No chest pain, shortness of breath, dizziness.    X-ray showed comminuted angulated intra-articular fractures of the middle ring fingers at the proximal phalangeal bases of the left hand and an angulated oblique intra-articular fracture of the proximal end of the middle phalanx of the little finger on the left hand. X-rays reviewed interpreted by myself, I agree with radiology read  Patient also have a laceration on the palm.  This was copiously irrigated and repaired with sutures.  Will put patient on empiric antibiotics as she does have underlying fractures.  Tetanus shot was already updated.  Discussed the need for follow-up with hand specialist.  She is going to try to speak with local orthopedics with whom she already follows, but knows to call the hand specialist if they are not able to see her closely.  She is informed she needs this follow-up.  She was given strict return precautions.  Informed on the need for suture removal in about a week, which can be done by the orthopedic doctor.  Amount and/or Complexity of Data Reviewed Radiology: ordered.  Risk OTC drugs. Prescription drug management.        Final diagnoses:  Closed displaced fracture of middle phalanx of left little finger, initial encounter  Closed displaced fracture of proximal phalanx of left middle finger, initial encounter  Closed displaced fracture of proximal phalanx of left ring finger, initial encounter  Laceration of left hand without foreign  body, initial encounter    ED Discharge Orders          Ordered    cephALEXin  (KEFLEX ) 500 MG capsule  4 times daily         03/20/24 1737               Suellen Sherran DELENA DEVONNA 03/20/24 GENNIE Dean Clarity, MD 03/20/24 1943

## 2024-03-20 NOTE — ED Notes (Signed)
 Sling placed on left arm   Sling info REF [id number] LOT [id number] [facility]: 1.[id number] (OTS)

## 2024-03-20 NOTE — ED Provider Notes (Signed)
 Patient presents to urgent care with her sister for evaluation of laceration to the base of the left middle finger that happened shortly prior to arrival urgent care.  She fell at home landing on her left hand.  One of the rings on her left hand cut the palmar aspect of her left middle finger.  She did not hit her head during the fall at home. Bleeding controlled currently.  Patient was offered wheelchair in the lobby, she declined this. When she was ambulating from the urgent care lobby to the urgent care treatment room, she suffered a syncopal event.  Patient fell backwards and was caught by [place].  Patient was gently assisted to the floor where she regained consciousness after 1 to 2 minutes.  No seizure activity, she did not hit her head on the floor, and she was alert and oriented after 1 to 2 minutes.  Bilateral radial pulses during syncopal event +2 and regular.  Denied chest pain and shortness of breath after syncopal event.  Recommend further workup and evaluation in the emergency room to rule out acute bony abnormality to the left hand prior to laceration repair as we do not currently have imaging at the urgent care today.  Additionally, she would benefit from labs to rule out electrolyte abnormality and other causes of syncopal event although I suspect this was vasovagal syncope due to laceration.   Heart rate 67 post syncopal event.  Oxygen 99% on room air.  Offered EMS transport to the nearest emergency room, patient declined. Discharged from urgent care in stable condition with sister and private car.  Discussed risks of deferring ED visit to which patient and sister expressed understanding and agreement with plan.   Enedelia Dorna HERO, [place] 03/20/24 1353

## 2024-03-23 ENCOUNTER — Other Ambulatory Visit (HOSPITAL_COMMUNITY)
Admission: RE | Admit: 2024-03-23 | Discharge: 2024-03-23 | Disposition: A | Discharge status: Home / Self Care | Source: Ambulatory Visit | Attending: Internal Medicine | Admitting: Internal Medicine

## 2024-03-23 DIAGNOSIS — I4891 Unspecified atrial fibrillation: Secondary | ICD-10-CM | POA: Diagnosis not present

## 2024-03-23 LAB — TSH: TSH: 2.496 u[IU]/mL (ref 0.350–4.500)

## 2024-03-24 ENCOUNTER — Encounter: Admitting: Orthopedic Surgery

## 2024-03-24 NOTE — Progress Notes (Signed)
 Cardiology Office Note    Date:  03/26/2024  ID:  Julie Miles, DOB June 08, 1951, MRN [id number] Cardiologist: Diannah SHAUNNA Maywood, MD    History of Present Illness:    Julie Miles is a 73 y.o. female with past medical history of CAD (prior Coronary CTA in 01/2022 showed mild to moderate nonobstructive disease), HLD and hypothyroidism who presents to the office today for hospital follow-up.  She was most recently admitted to [facility] in 01/2024 for evaluation of chest heaviness and palpitations and was found to be in atrial fibrillation with RVR. Was initially on IV Cardizem  but rates remained poorly controlled, therefore she was switched to IV Amiodarone . She did convert back to normal sinus rhythm with this and was transitioned to Amiodarone  200 mg daily for a total duration of 1 month (limited course given history of hypothyroidism) along with Lopressor  25 mg twice daily and Eliquis  5 mg twice daily for anticoagulation. Echocardiogram showed a preserved EF of 65 to 70% with no wall motion abnormalities and RV function was normal.   In the interim, she did present to [facility] ED on 03/20/2024 after having a mechanical fall and tripping down some steps. She landed on an outstretched hand and was found to have a commuted, angulated, intra-articular fracture of the middle and ring finger proximal phalangeal bases of the left hand. Was also noted to have an angulated, oblique intra-articular fracture of this proximal small finger middle phalanx. Was recommended to follow-up with a hand surgeon as an outpatient.  In talking with the patient today, she reports overall doing well from a cardiac perspective. Says that she had been experiencing intermittent palpitations for a few months prior to her hospitalization but denies any recurrent palpitations since. Did stop Amiodarone  after a month. Reports good compliance with Lopressor  and Eliquis . Experiences easy bruising but no melena,  hematochezia or hematuria. She denies any chest pain or dyspnea on exertion. No specific orthopnea, PND or pitting edema. She did miss her appointment with orthopedics yesterday and a message was sent today and they are thankfully able to see her back tomorrow.  Studies Reviewed:   EKG: EKG is ordered today and demonstrates:   EKG Interpretation Date/Time:  Thursday March 25 2024 14:38:40 EDT Ventricular Rate:  52 PR Interval:  134 QRS Duration:  88 QT Interval:  470 QTC Calculation: 437 R Axis:   74  Text Interpretation: Sinus bradycardia When compared with ECG of 31-Jan-2024 19:31, PREVIOUS ECG IS PRESENT Confirmed by Johnson Grate ([id number]) on 03/25/2024 2:55:58 PM       Coronary CTA: 01/2022 IMPRESSION: 1. Mild to moderate non-obstructive CAD, CADRADS =3.   2. Coronary calcium  score of 540. This was 94th percentile for age and sex matched control.   3. Normal coronary origin with right dominance.   4. Aortic atherosclerosis. Mild aortic valve leaflet and annular calcification.   5. Aggressive cardiovascular risk factor modification is recommended.  Echocardiogram: 01/2024 IMPRESSIONS     1. Left ventricular ejection fraction, by estimation, is 65 to 70%. The  left ventricle has normal function. The left ventricle has no regional  wall motion abnormalities. Left ventricular diastolic parameters are  indeterminate.   2. Right ventricular systolic function is normal. The right ventricular  size is normal. Tricuspid regurgitation signal is inadequate for assessing  PA pressure.   3. The mitral valve is normal in structure. No evidence of mitral valve  regurgitation. No evidence of mitral stenosis.   4. The aortic  valve is tricuspid. There is mild calcification of the  aortic valve. There is mild thickening of the aortic valve. Aortic valve  regurgitation is not visualized. No aortic stenosis is present.    Risk Assessment/Calculations:    CHA2DS2-VASc Score = 3    This indicates a 3.2% annual risk of stroke. The patient's score is based upon: CHF History: 0 HTN History: 0 Diabetes History: 0 Stroke History: 0 Vascular Disease History: 1 Age Score: 1 Gender Score: 1    Physical Exam:   VS:  BP (!) 142/68   Pulse (!) 52   Ht 5' 5 (1.651 m)   Wt 215 lb (97.5 kg)   SpO2 95%   BMI 35.78 kg/m    Wt Readings from Last 3 Encounters:  03/25/24 215 lb (97.5 kg)  03/20/24 208 lb (94.3 kg)  01/31/24 209 lb 3.5 oz (94.9 kg)     GEN: Well nourished, well developed female appearing in no acute distress NECK: No JVD; No carotid bruits CARDIAC: RRR, no murmurs, rubs, gallops RESPIRATORY:  Clear to auscultation without rales, wheezing or rhonchi  ABDOMEN: Appears non-distended. No obvious abdominal masses. EXTREMITIES: No clubbing or cyanosis. No pitting edema.  Distal pedal pulses are 2+ bilaterally. Splint in place along left hand.    Assessment and Plan:   1. PAF (paroxysmal atrial fibrillation) (HCC) - Diagnosed during her recent admission in 01/2024 and she did convert back to normal sinus rhythm with IV Amiodarone . Was recommended to complete a 1 month course of this and then discontinue. - She is maintaining normal sinus rhythm by examination and EKG today. Continue Lopressor  25 mg twice daily for rate-control and Eliquis  5 mg twice daily for anticoagulation. While her HR is in the 50's to 60's, she denies any associated symptoms. If HR remains in the 50's, can reduce Lopressor  to 12.5mg  BID or switch to Toprol -XL 25mg  daily.   2. Coronary artery disease involving native coronary artery of native heart without angina pectoris - Prior Coronary CTA in 01/2022 showed mild to moderate nonobstructive CAD. She denies any recent anginal symptoms.  - Continue Lopressor  25 mg twice daily and Crestor  10 mg daily. She is not on ASA given the need for anticoagulation.  3. Hyperlipidemia LDL goal <70 - Followed by PCP. LDL was at 72 when checked in  11/2023 by review of [facility] DXA.  Continue current medical therapy with Crestor  10 mg daily.  4. Hypothyroidism - TSH was stable at 2.496 when checked on 03/19/2024. Remains on Synthroid  50 mcg daily.  5. Pulmonary Nodules - Prior CT in 01/2022 showed areas of patchy, groundglass opacification and possible nodularity with follow-up CT recommended in 3 months. Was also noted to have right lower lobe nodules. Last office visit with Pulmonology was in 05/2022 and it was recommended to follow-up in 3 months but has not been evaluated by Pulmonology since. Would refer back at the time of her next visit if not evaluated in the interim.   Signed, Laymon CHRISTELLA Qua, PA-C

## 2024-03-25 ENCOUNTER — Ambulatory Visit: Attending: Student | Admitting: Student

## 2024-03-25 ENCOUNTER — Encounter: Payer: Self-pay | Admitting: Student

## 2024-03-25 VITALS — BP 142/68 | HR 52 | Ht 65.0 in | Wt 215.0 lb

## 2024-03-25 DIAGNOSIS — R911 Solitary pulmonary nodule: Secondary | ICD-10-CM | POA: Diagnosis not present

## 2024-03-25 DIAGNOSIS — I2510 Atherosclerotic heart disease of native coronary artery without angina pectoris: Secondary | ICD-10-CM | POA: Diagnosis not present

## 2024-03-25 DIAGNOSIS — E785 Hyperlipidemia, unspecified: Secondary | ICD-10-CM

## 2024-03-25 DIAGNOSIS — I480 Paroxysmal atrial fibrillation: Secondary | ICD-10-CM

## 2024-03-25 DIAGNOSIS — E039 Hypothyroidism, unspecified: Secondary | ICD-10-CM | POA: Diagnosis not present

## 2024-03-25 MED ORDER — METOPROLOL TARTRATE 25 MG PO TABS: 25.0000 mg | ORAL_TABLET | Freq: Two times a day (BID) | ORAL | 11 refills | Status: DC

## 2024-03-25 MED ORDER — APIXABAN 5 MG PO TABS: 5.0000 mg | ORAL_TABLET | Freq: Two times a day (BID) | ORAL | 11 refills | Status: AC

## 2024-03-25 NOTE — Patient Instructions (Signed)
 Medication Instructions:   Continue Lopressor  and Eliquis  as prescribed. You no longer need to take Amiodarone .   *If you need a refill on your cardiac medications before your next appointment, please call your pharmacy*   Follow-Up: At [facility], you and your health needs are our priority.  As part of our continuing mission to provide you with exceptional heart care, our providers are all part of one team.  This team includes your primary Cardiologist (physician) and Advanced Practice Providers or APPs (Physician Assistants and Nurse Practitioners) who all work together to provide you with the care you need, when you need it.  Your next appointment:   6 month(s)  Provider:   You may see Vishnu P Mallipeddi, MD or one of the following Advanced Practice Providers on your designated Care Team:   [place], PA-C  [place], [place] Olivia Pavy, [place]     We recommend signing up for the patient portal called MyChart.  Sign up information is provided on this After Visit Summary.  MyChart is used to connect with patients for Virtual Visits (Telemedicine).  Patients are able to view lab/test results, encounter notes, upcoming appointments, etc.  Non-urgent messages can be sent to your provider as well.   To learn more about what you can do with MyChart, go to [email or web address].

## 2024-03-26 ENCOUNTER — Ambulatory Visit: Admitting: Orthopedic Surgery

## 2024-03-26 ENCOUNTER — Encounter: Payer: Self-pay | Admitting: Student

## 2024-03-26 ENCOUNTER — Encounter: Payer: Self-pay | Admitting: Orthopedic Surgery

## 2024-03-26 DIAGNOSIS — S62627A Displaced fracture of medial phalanx of left little finger, initial encounter for closed fracture: Secondary | ICD-10-CM | POA: Diagnosis not present

## 2024-03-26 DIAGNOSIS — S62615A Displaced fracture of proximal phalanx of left ring finger, initial encounter for closed fracture: Secondary | ICD-10-CM

## 2024-03-26 DIAGNOSIS — S62613A Displaced fracture of proximal phalanx of left middle finger, initial encounter for closed fracture: Secondary | ICD-10-CM

## 2024-03-26 MED ORDER — HYDROCODONE-ACETAMINOPHEN 5-325 MG PO TABS: 1.0000 | ORAL_TABLET | Freq: Four times a day (QID) | ORAL | 0 refills | Status: DC | PRN

## 2024-03-26 NOTE — Progress Notes (Unsigned)
 Orthopaedic Clinic Return  Assessment: Julie Miles is a 73 y.o. female with the following: Proximal phalanx fractures of left long and ring fingers Middle phalanx fracture of the left small finger   Plan: Radiographs reviewed.  Mild displacement and angulation.  Patient comfortable with potential deformity.  Will treat without surgery.  Placed in a cast.  Medicines as needed.  Follow up in 2 weeks.    Cast application - Left short arm cast   Verbal consent was obtained and the correct extremity was identified. A well padded, appropriately molded short arm cast was applied to the Left arm Fingers remained warm and well perfused.   There were no sharp edges Patient tolerated the procedure well Cast care instructions were provided     Meds ordered this encounter  Medications   HYDROcodone -acetaminophen  (NORCO/VICODIN) 5-325 MG tablet    Sig: Take 1 tablet by mouth every 6 (six) hours as needed for up to 7 days for moderate pain (pain score 4-6).    Dispense:  10 tablet    Refill:  0    There is no height or weight on file to calculate BMI.  Follow-up: Return in about 19 days (around 04/14/2024).   Subjective:  Chief Complaint  Patient presents with   Fracture    L hand DOI 03/20/24    History of Present Illness: Julie Miles is a 73 y.o. female who returns to clinic for evaluation of left hand pain.  She tripped going up stairs a few days ago and injured her left hand.  She does not know she fell.  She was carrying plates of BBQ.  She was seen in the ED and placed in a splint.  She notes a deformity and swelling.  She is RHD.  No numbness or tingling.   Review of Systems: No fevers or chills No numbness or tingling No chest pain No shortness of breath No bowel or bladder dysfunction No GI distress No headaches   Objective: There were no vitals taken for this visit.  Physical Exam:  Left hand with bruising and swelling.  Extension deformity of the  long and ring fingers.  Left hand with intact sensation.  No lesions.  Tenderness to long and ring fingers. Small finger with some swelling.   IMAGING: I personally ordered and reviewed the following images:  Left hand XR  IMPRESSION: 1. Comminuted, angulated, intra-articular fractures of the middle and ring finger proximal phalangeal bases. 2. Angulated, oblique intra-articular fracture of the proximal small finger middle phalanx.  [name], MD 03/27/2024 10:37 PM

## 2024-03-26 NOTE — Patient Instructions (Signed)
General Cast Instructions  1.  You were placed in a cast in clinic today.  Please keep the cast material clean, dry and intact.  Please do not use anything to itch the under the cast.  If it gets itchy, you can consider taking benadryl, or similar medication.  If the cast material gets wet, place it on a towel and use a hair dryer on a low setting. 2.  Tylenol or Ibuprofen/Naproxen as needed.   3.  Recommend elevating your extremity as much as possible to help with swelling. 4.  F/u 2-3 weeks, cast off and repeat XR  

## 2024-03-29 ENCOUNTER — Telehealth: Payer: Self-pay | Admitting: Orthopedic Surgery

## 2024-03-29 MED ORDER — TRAMADOL HCL 50 MG PO TABS: 50.0000 mg | ORAL_TABLET | Freq: Four times a day (QID) | ORAL | 0 refills | Status: DC | PRN

## 2024-03-29 NOTE — Telephone Encounter (Signed)
 DR. ONESIMO Shed from [facility] called 03/26/24 at 3:42 pm states the patient is allergic to   HYDROcodone -acetaminophen  (NORCO/VICODIN) 5-325 MG tablet   He is going to hold off on filling it please call [facility] back at [phone]

## 2024-03-30 ENCOUNTER — Ambulatory Visit: Payer: Self-pay | Admitting: Internal Medicine

## 2024-04-14 ENCOUNTER — Encounter: Payer: Self-pay | Admitting: Orthopedic Surgery

## 2024-04-14 ENCOUNTER — Other Ambulatory Visit (INDEPENDENT_AMBULATORY_CARE_PROVIDER_SITE_OTHER): Payer: Self-pay

## 2024-04-14 ENCOUNTER — Ambulatory Visit: Admitting: Orthopedic Surgery

## 2024-04-14 DIAGNOSIS — S62615A Displaced fracture of proximal phalanx of left ring finger, initial encounter for closed fracture: Secondary | ICD-10-CM

## 2024-04-14 DIAGNOSIS — S62627A Displaced fracture of medial phalanx of left little finger, initial encounter for closed fracture: Secondary | ICD-10-CM

## 2024-04-14 DIAGNOSIS — S62613A Displaced fracture of proximal phalanx of left middle finger, initial encounter for closed fracture: Secondary | ICD-10-CM | POA: Diagnosis not present

## 2024-04-14 DIAGNOSIS — S62613D Displaced fracture of proximal phalanx of left middle finger, subsequent encounter for fracture with routine healing: Secondary | ICD-10-CM

## 2024-04-14 DIAGNOSIS — S62627D Displaced fracture of medial phalanx of left little finger, subsequent encounter for fracture with routine healing: Secondary | ICD-10-CM

## 2024-04-14 DIAGNOSIS — S62615D Displaced fracture of proximal phalanx of left ring finger, subsequent encounter for fracture with routine healing: Secondary | ICD-10-CM

## 2024-04-14 NOTE — Patient Instructions (Signed)

## 2024-04-14 NOTE — Progress Notes (Signed)
 Orthopaedic Clinic Return  Assessment: Julie Miles is a 73 y.o. female with the following: Proximal phalanx fractures of left long and ring fingers Middle phalanx fracture of the left small finger   Plan: Radiographs remain stable.  Swelling is getting better.  Sutures were removed from the volar long finger laceration.  This is healing appropriately.  Continue with 1 more cast.  Follow-up in 2 weeks.   Cast application - Left short arm cast   Verbal consent was obtained and the correct extremity was identified. A well padded, appropriately molded short arm cast was applied to the Left arm Fingers remained warm and well perfused.   There were no sharp edges Patient tolerated the procedure well Cast care instructions were provided     No orders of the defined types were placed in this encounter.   There is no height or weight on file to calculate BMI.  Follow-up: No follow-ups on file.   Subjective:  Chief Complaint  Patient presents with   Fracture     L hand DOI 03/20/24      History of Present Illness: Julie Miles is a 73 y.o. female who returns to clinic for evaluation of left hand pain.  She injured her left hand approximately 3-4 weeks ago.  She injured multiple fingers.  She had a laceration.  She has been immobilized in a cast.  She is doing well.  She is occasionally taking tramadol .  She tolerated the cast.   Review of Systems: No fevers or chills No numbness or tingling No chest pain No shortness of breath No bowel or bladder dysfunction No GI distress No headaches   Objective: There were no vitals taken for this visit.  Physical Exam:  Left hand with some bruising and diffuse swelling.  No point tenderness.  Volar laceration to the long finger is healed.  Sutures are ready to be removed.  Fingers warm and well-perfused.  Mild deformity appreciated to the long, ring and small finger.  IMAGING: I personally ordered and reviewed the  following images:  X-rays of the left hand were obtained in clinic today.  Fractures at the base of the long, ring fingers remain in stable alignment.  Fracture at the base of middle phalanx the small finger is in unchanged alignment.  There has been some interval consolidation.  No further displacement.  No bony lesions.  Impression: Stable fractures to multiple fingers   [name], MD 04/14/2024 9:31 AM

## 2024-04-23 ENCOUNTER — Ambulatory Visit: Admitting: Orthopedic Surgery

## 2024-04-28 ENCOUNTER — Other Ambulatory Visit (INDEPENDENT_AMBULATORY_CARE_PROVIDER_SITE_OTHER): Payer: Self-pay

## 2024-04-28 ENCOUNTER — Ambulatory Visit (INDEPENDENT_AMBULATORY_CARE_PROVIDER_SITE_OTHER): Admitting: Orthopedic Surgery

## 2024-04-28 ENCOUNTER — Encounter: Payer: Self-pay | Admitting: Orthopedic Surgery

## 2024-04-28 DIAGNOSIS — S62613D Displaced fracture of proximal phalanx of left middle finger, subsequent encounter for fracture with routine healing: Secondary | ICD-10-CM

## 2024-04-28 DIAGNOSIS — S62627D Displaced fracture of medial phalanx of left little finger, subsequent encounter for fracture with routine healing: Secondary | ICD-10-CM

## 2024-04-28 DIAGNOSIS — S62615D Displaced fracture of proximal phalanx of left ring finger, subsequent encounter for fracture with routine healing: Secondary | ICD-10-CM

## 2024-04-28 NOTE — Progress Notes (Signed)
 Orthopaedic Clinic Return  Assessment: Julie Miles is a 73 y.o. female with the following: Proximal phalanx fractures of left long and ring fingers Middle phalanx fracture of the left small finger   Plan: Julie Miles has multiple fractures in the left hand.  Radiographs are stable.  She has been immobilized for approximately 6 weeks.  No additional immobilization required.  Okay for her to initiate range of motion.  Provided her with simple exercises.  I would like to see her back in 1 month.  Will discuss proceeding with an injection at the left first [facility] joint at the next visit.   Follow-up: Return in about 4 weeks (around 05/26/2024).   Subjective:  Chief Complaint  Patient presents with   Hand Injury    Left     History of Present Illness: Julie Miles is a 73 y.o. female who returns to clinic for evaluation of left hand pain.  She injured her left hand approximately 6 weeks ago.  She has multiple fractures, multiple fingers.  She has done well with immobilization.  Her pain is controlled.  No numbness or tingling.  Review of Systems: No fevers or chills No numbness or tingling No chest pain No shortness of breath No bowel or bladder dysfunction No GI distress No headaches   Objective: There were no vitals taken for this visit.  Physical Exam:  Left hand with some residual swelling.  Mild deformity is appreciated.  All fingers are stiff.  She is unable to make a fist.  Volar long finger laceration is healed.  No drainage.  Fingers are warm and well-perfused.  IMAGING: I personally ordered and reviewed the following images:  X-rays left hand were taken today.  These compared to prior x-rays.  Fractures of the base of the proximal phalanx of the long and ring fingers remain in slightly extended position, but stable.  Fracture at the base of the middle phalanx of the small finger remains unchanged.  Impression: Left hand x-ray with multiple stable  fractures   Julie DELENA Horde, MD 04/28/2024 9:48 AM

## 2024-04-28 NOTE — Patient Instructions (Signed)
Hand Exercises  Hand exercises can be helpful for almost anyone. These exercises can strengthen the hands, improve flexibility and movement, and increase blood flow to the hands. These results can make work and daily tasks easier. Hand exercises can be especially helpful for people who have joint pain from arthritis or have nerve damage from overuse (carpal tunnel syndrome). These exercises can also help people who have injured a hand.  Exercises Most of these hand exercises are gentle stretching and motion exercises. It is usually safe to do them often throughout the day. Warming up your hands before exercise may help to reduce stiffness. You can do this with gentle massage or by placing your hands in warm water for 10-15 minutes. It is normal to feel some stretching, pulling, tightness, or mild discomfort as you begin new exercises. This will gradually improve. Stop an exercise right away if you feel sudden, severe pain or your pain gets worse. Ask your health care provider which exercises are best for you. Knuckle bend or "claw" fist Stand or sit with your arm, hand, and all five fingers pointed straight up. Make sure to keep your wrist straight during the exercise. Gently bend your fingers down toward your palm until the tips of your fingers are touching the top of your palm. Keep your big knuckle straight and just bend the small knuckles in your fingers. Hold this position for 10 seconds. Straighten (extend) your fingers back to the starting position. Repeat this exercise 5-10 times with each hand. Full finger fist Stand or sit with your arm, hand, and all five fingers pointed straight up. Make sure to keep your wrist straight during the exercise. Gently bend your fingers into your palm until the tips of your fingers are touching the middle of your palm. Hold this position for 10 seconds. Extend your fingers back to the starting position, stretching every joint fully. Repeat this exercise  5-10 times with each hand. Straight fist Stand or sit with your arm, hand, and all five fingers pointed straight up. Make sure to keep your wrist straight during the exercise. Gently bend your fingers at the big knuckle, where your fingers meet your hand, and the middle knuckle. Keep the knuckle at the tips of your fingers straight and try to touch the bottom of your palm. Hold this position for 10 seconds. Extend your fingers back to the starting position, stretching every joint fully. Repeat this exercise 5-10 times with each hand. Tabletop Stand or sit with your arm, hand, and all five fingers pointed straight up. Make sure to keep your wrist straight during the exercise. Gently bend your fingers at the big knuckle, where your fingers meet your hand, as far down as you can while keeping the small knuckles in your fingers straight. Think of forming a tabletop with your fingers. Hold this position for 10 seconds. Extend your fingers back to the starting position, stretching every joint fully. Repeat this exercise 5-10 times with each hand. Finger spread Place your hand flat on a table with your palm facing down. Make sure your wrist stays straight as you do this exercise. Spread your fingers and thumb apart from each other as far as you can until you feel a gentle stretch. Hold this position for 10 seconds. Bring your fingers and thumb tight together again. Hold this position for 10 seconds. Repeat this exercise 5-10 times with each hand. Making circles Stand or sit with your arm, hand, and all five fingers pointed straight up. Make   sure to keep your wrist straight during the exercise. Make a circle by touching the tip of your thumb to the tip of your index finger. Hold for 10 seconds. Then open your hand wide. Repeat this motion with your thumb and each finger on your hand. Repeat this exercise 5-10 times with each hand. Thumb motion Sit with your forearm resting on a table and your wrist  straight. Your thumb should be facing up toward the ceiling. Keep your fingers relaxed as you move your thumb. Lift your thumb up as high as you can toward the ceiling. Hold for 10 seconds. Bend your thumb across your palm as far as you can, reaching the tip of your thumb for the small finger (pinkie) side of your palm. Hold for 10 seconds. Repeat this exercise 5-10 times with each hand. Grip strengthening Hold a stress ball or other soft ball in the middle of your hand. Slowly increase the pressure, squeezing the ball as much as you can without causing pain. Think of bringing the tips of your fingers into the middle of your palm. All of your finger joints should bend when doing this exercise. Hold your squeeze for 10 seconds, then relax. Repeat this exercise 5-10 times with each hand.   Contact a health care provider if: Your hand pain or discomfort gets much worse when you do an exercise. Your hand pain or discomfort does not improve within 2 hours after you exercise. If you have any of these problems, stop doing these exercises right away. Do not do them again unless your health care provider says that you can.    Get help right away if: You develop sudden, severe hand pain or swelling. If this happens, stop doing these exercises right away. Do not do them again unless your health care provider says that you can. This information is not intended to replace advice given to you by your health care provider. Make sure you discuss any questions you have with your health care provider.     Wrist Fracture Rehab Ask your health care provider which exercises are safe for you. Do exercises exactly as told by your health care provider and adjust them as directed. It is normal to feel mild stretching, pulling, tightness, or discomfort as you do these exercises. Stop right away if you feel sudden pain or your pain gets worse. Do not begin these exercises until told by your health care  provider. Stretching and range-of-motion exercises These exercises warm up your muscles and joints and improve the movement and flexibility of your wrist and hand. These exercises also help to relieve pain,numbness, and tingling. Finger flexion and extension Sit or stand with your elbow at your side. Open and stretch your left / right fingers as wide as you can (extension). Hold this position for 10 seconds. Close your left / right fingers into a gentle fist (flexion). Hold this position for 10 seconds. Slowly return to the starting position. Repeat 10 times. Complete this exercise 1-2 times a day. Wrist flexion Bend your left / right elbow to a 90-degree angle (right angle) with your palm facing the floor. Bend your wrist forward so your fingers point toward the floor (flexion). Hold this position for 10 seconds. Slowly return to the starting position. Repeat 10 times. Complete this exercise 1-2 times a day. Wrist extension Bend your left / right elbow to a 90-degree angle (right angle) with your palm facing the floor. Bend your wrist backward so your fingers point toward   the ceiling (extension). Hold this position for 10 seconds. Slowly return to the starting position. Repeat 10 times. Complete this exercise 1-2 times a day. Ulnar deviation Bend your left / right elbow to a 90-degree angle (right angle), and rest your forearm on a table with your palm facing down. Keeping your hand flat on the table, bend your left / right wrist toward your small finger (pinkie). This is ulnar deviation. Hold this position for 10 seconds. Slowly return to the starting position. Repeat 10 times. Complete this exercise 1-2 times a day. Radial deviation Bend your left / right elbow to a 90-degree angle (right angle), and rest your forearm on a table with your palm facing down. Keeping your hand flat on the table, bend your left / right wrist toward your thumb. This is radial deviation. Hold this  position for 10 seconds. Slowly return to the starting position. Repeat 10 times. Complete this exercise 1-2 times a day. Forearm rotation, supination Stand or sit with your left / right elbow bent to a 90-degree angle (right angle) at your side. Position your forearm so that the thumb is facing the ceiling (neutral position). Turn (rotate) your palm up toward the ceiling (supination), stopping when you feel a gentle stretch. Hold this position for 10 seconds. Slowly return to the starting position. Repeat 10 times. Complete this exercise 1-2 times a day. Forearm rotation, pronation Stand or sit with your left / right elbow bent to a 90-degree angle (right angle) at your side. Position your forearm so that the thumb is facing the ceiling (neutral position). Turn (rotate) your palm down toward the floor (pronation), stopping when you feel a gentle stretch. Hold this position for 10 seconds. Slowly return to the starting position. Repeat 10 times. Complete this exercise 1-2 times a day. Wrist flexion stretch  Extend your left / right arm in front of you and turn your palm down toward the floor. If told by your health care provider, bend your left / right arm to a 90-degree angle (right angle) at your side. Using your uninjured hand, gently press over the back of your left / right hand to bend your wrist and fingers toward the floor (flexion). Go as far as you can to feel a stretch without causing pain. Hold this position for 10 seconds. Slowly return to the starting position. Repeat 10 times. Complete this exercise 1-2 times a day. Wrist extension stretch  Extend your left / right arm in front of you and turn your palm up toward the ceiling. If told by your health care provider, bend your left / right arm to a 90-degree angle (right angle) at your side. Using your uninjured hand, gently press over the palm of your left / right hand to bend your wrist and fingers toward the floor (extension).  Go as far as you can to feel a stretch without causing pain. Hold this position for 10 seconds. Slowly return to the starting position. Repeat 10 times. Complete this exercise 1-2 times a day. Forearm rotation stretch, supination Stand or sit with your arms at your sides. Bend your left / right elbow to a 90-degree angle (right angle). Using your uninjured hand, turn your left / right palm up toward the ceiling (assisted supination) until you feel a gentle stretch in the inside of your forearm. Hold this position for 10 seconds. Slowly return to the starting position. Repeat 10 times. Complete this exercise 1-2 times a day. Forearm rotation stretch, pronation Stand or   sit with your arms at your sides. Bend your left / right elbow to a 90-degree angle (right angle). Using your uninjured hand, turn your left / right palm down toward the floor (assisted pronation) until you feel a gentle stretch in the top of your forearm. Hold this position for 10 seconds. Slowly return to the starting position. Repeat 10 times. Complete this exercise 1-2 times a day. Strengthening exercises These exercises build strength and endurance in your wrist and hand. Enduranceis the ability to use your muscles for a long time, even after they get tired. Wrist flexion Sit with your left / right forearm supported on a table. Your elbow should be at waist height. Rest your hand over the edge of the table, palm up. Gently grasp a 5 lb / kg weight (can of soup). Or, hold an exercise band or tube in both hands, keeping your hands at the same level and hip distance apart. There should be slight tension in the exercise band or tube. Without moving your forearm or elbow, slowly bend your wrist up toward the ceiling (wrist flexion). Hold this position for 10 seconds. Slowly return to the starting position. Repeat 10 times. Complete this exercise 1-2 times a day. Wrist extension Sit with your left / right forearm supported  on a table. Your elbow should be at waist height. Rest your hand over the edge of the table, palm down. Gently grasp a 5 lb / kg weight. Or, hold an exercise band or tube in both hands, keeping your hands at the same level and hip distance apart. There should be slight tension in the exercise band or tube. Without moving your forearm or elbow, slowly curl your hand up toward the ceiling (extension). Hold this position for 10 seconds. Slowly return to the starting position. Repeat 10 times. Complete this exercise 1-2 times a day. Forearm rotation, supination  Sit with your left / right forearm supported on a table. Your elbow should be at waist height. Rest your hand over the edge of the table, palm down. Gently grasp a lightweight hammer near the head. As this exercise gets easier for you, try holding the hammer farther down the handle. Without moving your elbow, slowly turn (rotate) your palm up toward the ceiling (supination). Hold this position for 10 seconds. Slowly return to the starting position. Repeat 10 times. Complete this exercise 1-2 times a day. Forearm rotation, pronation  Sit with your left / right forearm supported on a table. Your elbow should be at waist height. Rest your hand over the edge of the table, palm up. Gently grasp a lightweight hammer near the head. As this exercise gets easier for you, try holding the hammer farther down the handle. Without moving your elbow, slowly turn (rotate) your palm down toward the floor (pronation). Hold this position for 10 seconds. Slowly return to the starting position. Repeat 10 times. Complete this exercise 1-2 times a day. Grip strengthening  Hold one of these items in your left / right hand: a dense sponge, a stress ball, or a large, rolled sock. Slowly squeeze the object as hard as you can without increasing any pain. Hold your squeeze for 10 seconds. Slowly release your grip. Repeat 10 times. Complete this exercise 1-2  times a day. This information is not intended to replace advice given to you by your health care provider. Make sure you discuss any questions you have with your healthcare provider. Document Revised: 01/27/2020 Document Reviewed: 01/27/2020 [employer] Patient Education    2022 [employer]  

## 2024-05-03 ENCOUNTER — Telehealth: Payer: Self-pay

## 2024-05-03 DIAGNOSIS — S62627D Displaced fracture of medial phalanx of left little finger, subsequent encounter for fracture with routine healing: Secondary | ICD-10-CM

## 2024-05-03 DIAGNOSIS — S62613D Displaced fracture of proximal phalanx of left middle finger, subsequent encounter for fracture with routine healing: Secondary | ICD-10-CM

## 2024-05-03 DIAGNOSIS — S62615D Displaced fracture of proximal phalanx of left ring finger, subsequent encounter for fracture with routine healing: Secondary | ICD-10-CM

## 2024-05-03 NOTE — Telephone Encounter (Signed)
 Patient stopped by and wants to get a referral for PT at [facility]

## 2024-05-05 ENCOUNTER — Telehealth: Payer: Self-pay | Admitting: Orthopedic Surgery

## 2024-05-05 NOTE — Telephone Encounter (Signed)
 DR. ONESIMO    Patient came in the office to tell Dr. ONESIMO that she went to PT and they said they don't have a order for her to do PT.  She went to [facility] across from [facility].  Please call her back at [phone]

## 2024-05-05 NOTE — Telephone Encounter (Signed)
Pt has been scheduled for OT

## 2024-05-13 ENCOUNTER — Ambulatory Visit (HOSPITAL_COMMUNITY): Attending: Orthopedic Surgery | Admitting: Occupational Therapy

## 2024-05-13 ENCOUNTER — Encounter (HOSPITAL_COMMUNITY): Payer: Self-pay | Admitting: Occupational Therapy

## 2024-05-13 DIAGNOSIS — S62627D Displaced fracture of medial phalanx of left little finger, subsequent encounter for fracture with routine healing: Secondary | ICD-10-CM | POA: Insufficient documentation

## 2024-05-13 DIAGNOSIS — M25641 Stiffness of right hand, not elsewhere classified: Secondary | ICD-10-CM | POA: Diagnosis not present

## 2024-05-13 DIAGNOSIS — M79644 Pain in right finger(s): Secondary | ICD-10-CM | POA: Insufficient documentation

## 2024-05-13 DIAGNOSIS — S62613D Displaced fracture of proximal phalanx of left middle finger, subsequent encounter for fracture with routine healing: Secondary | ICD-10-CM | POA: Insufficient documentation

## 2024-05-13 DIAGNOSIS — R29898 Other symptoms and signs involving the musculoskeletal system: Secondary | ICD-10-CM | POA: Insufficient documentation

## 2024-05-13 DIAGNOSIS — S62615D Displaced fracture of proximal phalanx of left ring finger, subsequent encounter for fracture with routine healing: Secondary | ICD-10-CM | POA: Insufficient documentation

## 2024-05-13 NOTE — Patient Instructions (Signed)

## 2024-05-13 NOTE — Therapy (Signed)
 OUTPATIENT OCCUPATIONAL THERAPY ORTHO EVALUATION  Patient Name: Julie Miles MRN: [id number] DOB:07/01/1951, 73 y.o., female Today's Date: 05/14/2024  PCP: Marvine Rush, MD REFERRING PROVIDER: Onesimo Anes, MD  END OF SESSION:  OT End of Session - 05/14/24 1019     Visit Number 1    Number of Visits 9    Date for OT Re-Evaluation 06/18/24    Authorization Type [employer]    Progress Note Due on Visit 10    OT Start Time 1127    OT Stop Time 1206    OT Time Calculation (min) 39 min    Activity Tolerance Patient tolerated treatment well    Behavior During Therapy [facility] for tasks assessed/performed          Past Medical History:  Diagnosis Date   Anxiety    History of blood transfusion 1975   at [facility] after vag delivery   History of kidney stones    Hypercholesterolemia    Hypothyroidism    MRSA (methicillin resistant Staphylococcus aureus) 03/08/2020   confirmed by PCR   PONV (postoperative nausea and vomiting)    Past Surgical History:  Procedure Laterality Date   ANTERIOR AND POSTERIOR REPAIR  06/24/2012   Procedure: ANTERIOR (CYSTOCELE) AND POSTERIOR REPAIR (RECTOCELE);  Surgeon: Rosaline DELENA Luna, MD;  Location: [facility];  Service: Gynecology;  Laterality: N/A;  anterior repair   APPENDECTOMY     BLADDER SUSPENSION  06/24/2012   Procedure: TRANSVAGINAL TAPE (TVT) PROCEDURE;  Surgeon: Rosaline DELENA Luna, MD;  Location: [facility];  Service: Gynecology;  Laterality: N/A;   CHOLECYSTECTOMY     CYSTOSCOPY  06/24/2012   Procedure: CYSTOSCOPY;  Surgeon: Rosaline DELENA Luna, MD;  Location: [facility];  Service: Gynecology;  Laterality: N/A;   FOOT SURGERY Right    KNEE ARTHROSCOPY W/ MENISCAL REPAIR Right    LAPAROSCOPY  06/24/2012   Procedure: LAPAROSCOPY OPERATIVE;  Surgeon: Rosaline DELENA Luna, MD;  Location: [facility];  Service: Gynecology;  Laterality: N/A;   TOTAL KNEE ARTHROPLASTY Right 03/13/2020   Procedure: RIGHT TOTAL KNEE ARTHROPLASTY;  Surgeon: Jerri Kay HERO, MD;  Location: [facility];  Service: Orthopedics;  Laterality: Right;   TUBAL LIGATION     VAGINAL HYSTERECTOMY  06/24/2012   Procedure: HYSTERECTOMY VAGINAL;  Surgeon: Rosaline DELENA Luna, MD;  Location: [facility];  Service: Gynecology;  Laterality: N/A;   Patient Active Problem List   Diagnosis Date Noted   Paroxysmal atrial fibrillation with RVR (HCC) 01/31/2024   Mixed hyperlipidemia 01/31/2024   Acquired hypothyroidism 01/31/2024   Family history of early CAD 01/24/2022   Dyspnea on exertion 01/24/2022   Exertional angina (HCC) 01/24/2022   Status post total right knee replacement 03/13/2020   Primary osteoarthritis of right knee 02/10/2020   S/P right knee arthroscopy 03/03/2019   Sciatica of left side 11/19/2018   Anxiety 07/20/2010   HEMATOCHEZIA 07/20/2010    ONSET DATE: 03/20/24  REFERRING DIAG:  D37.386I (ICD-10-CM) - Closed displaced fracture of proximal phalanx of left middle finger with routine healing, subsequent encounter  S62.627D (ICD-10-CM) - Closed displaced fracture of middle phalanx of left little finger with routine healing, subsequent encounter  S62.615D (ICD-10-CM) - Closed displaced fracture of proximal phalanx of left ring finger with routine healing, subsequent encounter    THERAPY DIAG:  Pain in right finger(s)  Finger stiffness, right  Other symptoms and signs involving the musculoskeletal system  Rationale for Evaluation and Treatment: Rehabilitation  SUBJECTIVE:   SUBJECTIVE STATEMENT: It affected  my index finger as well. Pt accompanied by: self  PERTINENT HISTORY: Julie Miles is a 73 y.o. female with the following: Proximal phalanx fractures of left long and ring fingers, Middle phalanx fracture of the left small finger. Pt was immobilized for 6 weeks. Recent x-rays indicate healing well with small noted displacement still visible.   PRECAUTIONS: None  WEIGHT BEARING RESTRICTIONS: No  PAIN:  Are you having pain? Yes: NPRS  scale: 6/10 Pain location: L fingers Pain description: aching sharp Aggravating factors: movement and pressure Relieving factors: rest  FALLS: Has patient fallen in last 6 months? Yes. Number of falls 1  PLOF: Independent  PATIENT GOALS: To improve pain and function  NEXT MD VISIT: 06/09/24  OBJECTIVE:  Note: Objective measures were completed at Evaluation unless otherwise noted.  HAND DOMINANCE: Right  ADLs: Overall ADLs: Pt has difficulty with washing hair, holding towels to dry off after shower, difficulty with clasps, buttons, zippers. Poor grip/grasp on items during cooking and cleaning.  FUNCTIONAL OUTCOME MEASURES: Quick Dash: 50  UPPER EXTREMITY ROM:      Active ROM Left eval  Thumb MCP (0-60) 50  Thumb IP (0-80) 60  Thumb Opposition to Small Finger  able  Index MCP (0-90)  80  Index PIP (0-100)  75  Index DIP (0-70)  35  Long MCP (0-90)  70  Long PIP (0-100)  80  Long DIP (0-70)  30  Ring MCP (0-90) 65   Ring PIP (0-100)  75  Ring DIP (0-70)  20  Little MCP (0-90)  65  Little PIP (0-100)  25  Little DIP (0-70)  20  (Blank rows = not tested)  HAND FUNCTION: Grip strength: Right: 52 lbs; Left: 12 lbs, Lateral pinch: Right: 12 lbs, Left: 9 lbs, and 3 point pinch: Right: 15 lbs, Left: 5 lbs  COORDINATION: 9 Hole Peg test: Right: -- sec; Left: -- sec  SENSATION: WFL  EDEMA: mild swelling noted along digit 2 and 4.   OBSERVATIONS: Index finger is the most stiff and ring finger is the most painful   TREATMENT DATE:   05/13/24 -Digit ROM: composite flexion, abduction, finger taps, opposition, x10  -Towel crumple, x4                                                                                                                                PATIENT EDUCATION: Education details: Digit ROM and towel crumple Person educated: Patient Education method: Explanation, Demonstration, and Handouts Education comprehension: verbalized understanding and  returned demonstration  HOME EXERCISE PROGRAM: 8/14: Digit ROM and Towel Crumple  GOALS: Goals reviewed with patient? Yes  SHORT TERM GOALS: Target date: 06/18/24  Pt will be provided and educated on comprehensive hep for L hand mobility and strength in order to independently complete ADL's.   Goal status: INITIAL  2.  Pt will decrease pain in the LUE to 2/10 or less in order to sleep 3+  consecutive hours without waking due to pain.  Goal status: INITIAL  3.  Pt will increase digit ROM by 10* in order to make a full fist for grasping items needed during grooming and dressing tasks.   Goal status: INITIAL  4.  Pt will increase LUE grip by 20# and pinch by 2# in order to grasp and hold items during cooking and cleaning tasks without dropping them.  Goal status: INITIAL  5.  Pt will improve LUE coordination by completing the 9 hole peg test in 30 or less, in order to manipulate buttons, zippers, and clasps independently.   Goal status: INITIAL   ASSESSMENT:  CLINICAL IMPRESSION: Patient is a 73 y.o. female who was seen today for occupational therapy evaluation for multiple LUE finger fractures. She presents to therapy with increased pain and stiffness/weakness along entire L hand.  PERFORMANCE DEFICITS: in functional skills including ADLs, IADLs, coordination, dexterity, sensation, edema, ROM, strength, pain, fascial restrictions, Fine motor control, body mechanics, and UE functional use.  IMPAIRMENTS: are limiting patient from ADLs, IADLs, rest and sleep, work, leisure, and social participation.   COMORBIDITIES: has no other co-morbidities that affects occupational performance. Patient will benefit from skilled OT to address above impairments and improve overall function.  MODIFICATION OR ASSISTANCE TO COMPLETE EVALUATION: No modification of tasks or assist necessary to complete an evaluation.  OT OCCUPATIONAL PROFILE AND HISTORY: Problem focused assessment: Including review  of records relating to presenting problem.  CLINICAL DECISION MAKING: LOW - limited treatment options, no task modification necessary  REHAB POTENTIAL: Good  EVALUATION COMPLEXITY: Low      PLAN:  OT FREQUENCY: 2x/week  OT DURATION: 4 weeks  PLANNED INTERVENTIONS: 97168 OT Re-evaluation, 97535 self care/ADL training, 02889 therapeutic exercise, 97530 therapeutic activity, 97112 neuromuscular re-education, 97140 manual therapy, 97035 ultrasound, 97018 paraffin, 02989 moist heat, 97032 electrical stimulation (manual), passive range of motion, functional mobility training, energy conservation, coping strategies training, patient/family education, and DME and/or AE instructions  RECOMMENDED OTHER SERVICES: N/A  CONSULTED AND AGREED WITH PLAN OF CARE: Patient  PLAN FOR NEXT SESSION: stretching, Digit ROM, Digit blocking, grip strengthening  Valentin Nightingale, OTR/L [facility] [phone] Kendel Bessey Jillyn Nightingale, OT 05/14/2024, 10:21 AM

## 2024-05-18 ENCOUNTER — Ambulatory Visit (INDEPENDENT_AMBULATORY_CARE_PROVIDER_SITE_OTHER): Payer: Self-pay | Admitting: Occupational Therapy

## 2024-05-18 ENCOUNTER — Encounter (HOSPITAL_COMMUNITY): Payer: Self-pay | Admitting: Occupational Therapy

## 2024-05-18 DIAGNOSIS — M25641 Stiffness of right hand, not elsewhere classified: Secondary | ICD-10-CM | POA: Diagnosis not present

## 2024-05-18 DIAGNOSIS — R29898 Other symptoms and signs involving the musculoskeletal system: Secondary | ICD-10-CM

## 2024-05-18 DIAGNOSIS — M79644 Pain in right finger(s): Secondary | ICD-10-CM | POA: Diagnosis not present

## 2024-05-18 DIAGNOSIS — S62613D Displaced fracture of proximal phalanx of left middle finger, subsequent encounter for fracture with routine healing: Secondary | ICD-10-CM | POA: Diagnosis not present

## 2024-05-18 DIAGNOSIS — S62615D Displaced fracture of proximal phalanx of left ring finger, subsequent encounter for fracture with routine healing: Secondary | ICD-10-CM | POA: Diagnosis not present

## 2024-05-18 DIAGNOSIS — S62627D Displaced fracture of medial phalanx of left little finger, subsequent encounter for fracture with routine healing: Secondary | ICD-10-CM | POA: Diagnosis not present

## 2024-05-18 NOTE — Therapy (Unsigned)
 OUTPATIENT OCCUPATIONAL THERAPY ORTHO TREATMENT NOTE  Patient Name: Julie Miles MRN: [id number] DOB:21-Aug-1951, 73 y.o., female Today's Date: 05/19/2024  PCP: Marvine Rush, MD REFERRING PROVIDER: Onesimo Anes, MD  END OF SESSION:  OT End of Session - 05/18/24 0802     Visit Number 2    Number of Visits 9    Date for OT Re-Evaluation 06/18/24    Authorization Type [employer]    Progress Note Due on Visit 10    OT Start Time 0722    OT Stop Time 0802    OT Time Calculation (min) 40 min    Activity Tolerance Patient tolerated treatment well    Behavior During Therapy [facility] for tasks assessed/performed           Past Medical History:  Diagnosis Date   Anxiety    History of blood transfusion 1975   at [facility] after vag delivery   History of kidney stones    Hypercholesterolemia    Hypothyroidism    MRSA (methicillin resistant Staphylococcus aureus) 03/08/2020   confirmed by PCR   PONV (postoperative nausea and vomiting)    Past Surgical History:  Procedure Laterality Date   ANTERIOR AND POSTERIOR REPAIR  06/24/2012   Procedure: ANTERIOR (CYSTOCELE) AND POSTERIOR REPAIR (RECTOCELE);  Surgeon: Rosaline DELENA Luna, MD;  Location: [facility];  Service: Gynecology;  Laterality: N/A;  anterior repair   APPENDECTOMY     BLADDER SUSPENSION  06/24/2012   Procedure: TRANSVAGINAL TAPE (TVT) PROCEDURE;  Surgeon: Rosaline DELENA Luna, MD;  Location: [facility];  Service: Gynecology;  Laterality: N/A;   CHOLECYSTECTOMY     CYSTOSCOPY  06/24/2012   Procedure: CYSTOSCOPY;  Surgeon: Rosaline DELENA Luna, MD;  Location: [facility];  Service: Gynecology;  Laterality: N/A;   FOOT SURGERY Right    KNEE ARTHROSCOPY W/ MENISCAL REPAIR Right    LAPAROSCOPY  06/24/2012   Procedure: LAPAROSCOPY OPERATIVE;  Surgeon: Rosaline DELENA Luna, MD;  Location: [facility];  Service: Gynecology;  Laterality: N/A;   TOTAL KNEE ARTHROPLASTY Right 03/13/2020   Procedure: RIGHT TOTAL KNEE ARTHROPLASTY;  Surgeon:  Jerri Kay HERO, MD;  Location: [facility];  Service: Orthopedics;  Laterality: Right;   TUBAL LIGATION     VAGINAL HYSTERECTOMY  06/24/2012   Procedure: HYSTERECTOMY VAGINAL;  Surgeon: Rosaline DELENA Luna, MD;  Location: [facility];  Service: Gynecology;  Laterality: N/A;   Patient Active Problem List   Diagnosis Date Noted   Paroxysmal atrial fibrillation with RVR (HCC) 01/31/2024   Mixed hyperlipidemia 01/31/2024   Acquired hypothyroidism 01/31/2024   Family history of early CAD 01/24/2022   Dyspnea on exertion 01/24/2022   Exertional angina (HCC) 01/24/2022   Status post total right knee replacement 03/13/2020   Primary osteoarthritis of right knee 02/10/2020   S/P right knee arthroscopy 03/03/2019   Sciatica of left side 11/19/2018   Anxiety 07/20/2010   HEMATOCHEZIA 07/20/2010    ONSET DATE: 03/20/24  REFERRING DIAG:  D37.386I (ICD-10-CM) - Closed displaced fracture of proximal phalanx of left middle finger with routine healing, subsequent encounter  S62.627D (ICD-10-CM) - Closed displaced fracture of middle phalanx of left little finger with routine healing, subsequent encounter  S62.615D (ICD-10-CM) - Closed displaced fracture of proximal phalanx of left ring finger with routine healing, subsequent encounter    THERAPY DIAG:  Pain in right finger(s)  Finger stiffness, right  Other symptoms and signs involving the musculoskeletal system  Rationale for Evaluation and Treatment: Rehabilitation  SUBJECTIVE:   SUBJECTIVE STATEMENT:  It has been stiff Pt accompanied by: self  PERTINENT HISTORY: Julie Miles is a 73 y.o. female with the following: Proximal phalanx fractures of left long and ring fingers, Middle phalanx fracture of the left small finger. Pt was immobilized for 6 weeks. Recent x-rays indicate healing well with small noted displacement still visible.   PRECAUTIONS: None  WEIGHT BEARING RESTRICTIONS: No  PAIN:  Are you having pain? Yes: NPRS scale: 6/10 Pain  location: L fingers Pain description: aching sharp Aggravating factors: movement and pressure Relieving factors: rest  FALLS: Has patient fallen in last 6 months? Yes. Number of falls 1  PLOF: Independent  PATIENT GOALS: To improve pain and function  NEXT MD VISIT: 06/09/24  OBJECTIVE:  Note: Objective measures were completed at Evaluation unless otherwise noted.  HAND DOMINANCE: Right  ADLs: Overall ADLs: Pt has difficulty with washing hair, holding towels to dry off after shower, difficulty with clasps, buttons, zippers. Poor grip/grasp on items during cooking and cleaning.  FUNCTIONAL OUTCOME MEASURES: Quick Dash: 50  UPPER EXTREMITY ROM:      Active ROM Left eval  Thumb MCP (0-60) 50  Thumb IP (0-80) 60  Thumb Opposition to Small Finger  able  Index MCP (0-90)  80  Index PIP (0-100)  75  Index DIP (0-70)  35  Long MCP (0-90)  70  Long PIP (0-100)  80  Long DIP (0-70)  30  Ring MCP (0-90) 65   Ring PIP (0-100)  75  Ring DIP (0-70)  20  Little MCP (0-90)  65  Little PIP (0-100)  25  Little DIP (0-70)  20  (Blank rows = not tested)  HAND FUNCTION: Grip strength: Right: 52 lbs; Left: 12 lbs, Lateral pinch: Right: 12 lbs, Left: 9 lbs, and 3 point pinch: Right: 15 lbs, Left: 5 lbs  COORDINATION: 9 Hole Peg test: Right: -- sec; Left: -- sec  SENSATION: WFL  EDEMA: mild swelling noted along digit 2 and 4.   OBSERVATIONS: Index finger is the most stiff and ring finger is the most painful   TREATMENT DATE:   05/18/24 -Manual therapy: myofascial release and edema massage applied to all fingers and palmar aspect of hand in order to reduce fascial restrictions and pain, as well as improve ROM. -Digit ROM: composite flexion, abduction, finger taps, opposition, x10  -Towel crumple, x4  -Digit Blocking: each joint of each finger x6 -Gripping squeeze cone x10 -Stretching: OT stretching each finger into flexion 5x15  05/13/24 -Digit ROM: composite flexion,  abduction, finger taps, opposition, x10  -Towel crumple, x4                                                                                                                                PATIENT EDUCATION: Education details:Continue HEP Person educated: Patient Education method: Explanation, Demonstration, and Handouts Education comprehension: verbalized understanding and returned demonstration  HOME EXERCISE PROGRAM: 8/14: Digit ROM and Towel Crumple  GOALS: Goals reviewed with patient? Yes  SHORT TERM GOALS: Target date: 06/18/24  Pt will be provided and educated on comprehensive hep for L hand mobility and strength in order to independently complete ADL's.   Goal status: IN PROGRESS  2.  Pt will decrease pain in the LUE to 2/10 or less in order to sleep 3+ consecutive hours without waking due to pain.  Goal status: IN PROGRESS  3.  Pt will increase digit ROM by 10* in order to make a full fist for grasping items needed during grooming and dressing tasks.   Goal status: IN PROGRESS  4.  Pt will increase LUE grip by 20# and pinch by 2# in order to grasp and hold items during cooking and cleaning tasks without dropping them.  Goal status: IN PROGRESS  5.  Pt will improve LUE coordination by completing the 9 hole peg test in 30 or less, in order to manipulate buttons, zippers, and clasps independently.   Goal status: IN PROGRESS   ASSESSMENT:  CLINICAL IMPRESSION: Pt presenting to first OT treatment session with continued stiffness and pain. She reports improvements in pain with manual therapy, however pain returned with exercises and stretching. Pt has the most difficulty with making a fist due to stiffness, all other motions are fair. OT providing hands on assist with stretches and verbal/tactile cuing for positioning and technique throughout.  PERFORMANCE DEFICITS: in functional skills including ADLs, IADLs, coordination, dexterity, sensation, edema, ROM, strength,  pain, fascial restrictions, Fine motor control, body mechanics, and UE functional use.   PLAN:  OT FREQUENCY: 2x/week  OT DURATION: 4 weeks  PLANNED INTERVENTIONS: 97168 OT Re-evaluation, 97535 self care/ADL training, 02889 therapeutic exercise, 97530 therapeutic activity, 97112 neuromuscular re-education, 97140 manual therapy, 97035 ultrasound, 97018 paraffin, 02989 moist heat, 97032 electrical stimulation (manual), passive range of motion, functional mobility training, energy conservation, coping strategies training, patient/family education, and DME and/or AE instructions  RECOMMENDED OTHER SERVICES: N/A  CONSULTED AND AGREED WITH PLAN OF CARE: Patient  PLAN FOR NEXT SESSION: stretching, Digit ROM, Digit blocking, grip strengthening  Valentin Nightingale, OTR/L [facility] [phone] Valentin Jillyn Nightingale, OT 05/19/2024, 10:19 AM

## 2024-05-21 ENCOUNTER — Ambulatory Visit (HOSPITAL_COMMUNITY): Payer: Self-pay | Admitting: Occupational Therapy

## 2024-05-21 ENCOUNTER — Encounter (HOSPITAL_COMMUNITY): Payer: Self-pay | Admitting: Occupational Therapy

## 2024-05-21 DIAGNOSIS — M79644 Pain in right finger(s): Secondary | ICD-10-CM | POA: Diagnosis not present

## 2024-05-21 DIAGNOSIS — S62627D Displaced fracture of medial phalanx of left little finger, subsequent encounter for fracture with routine healing: Secondary | ICD-10-CM | POA: Diagnosis not present

## 2024-05-21 DIAGNOSIS — S62613D Displaced fracture of proximal phalanx of left middle finger, subsequent encounter for fracture with routine healing: Secondary | ICD-10-CM | POA: Diagnosis not present

## 2024-05-21 DIAGNOSIS — M25641 Stiffness of right hand, not elsewhere classified: Secondary | ICD-10-CM | POA: Diagnosis not present

## 2024-05-21 DIAGNOSIS — S62615D Displaced fracture of proximal phalanx of left ring finger, subsequent encounter for fracture with routine healing: Secondary | ICD-10-CM | POA: Diagnosis not present

## 2024-05-21 DIAGNOSIS — R29898 Other symptoms and signs involving the musculoskeletal system: Secondary | ICD-10-CM

## 2024-05-21 NOTE — Therapy (Signed)
 OUTPATIENT OCCUPATIONAL THERAPY ORTHO TREATMENT NOTE  Patient Name: Julie Miles MRN: [id number] DOB:12-01-1950, 73 y.o., female Today's Date: 05/21/2024  PCP: Julie Rush, MD REFERRING PROVIDER: Onesimo Anes, MD  END OF SESSION:   05/21/24 1015  OT Visits / Re-Eval  Visit Number 3  Number of Visits 9  Date for OT Re-Evaluation 06/18/24  Authorization  Authorization Type [employer]  Progress Note Due on Visit 10  OT Time Calculation  OT Start Time [phone]  OT Stop Time 1015  OT Time Calculation (min) 39 min  End of Session  Activity Tolerance Patient tolerated treatment well  Behavior During Therapy [facility] for tasks assessed/performed    Past Medical History:  Diagnosis Date   Anxiety    History of blood transfusion 1975   at [facility] after vag delivery   History of kidney stones    Hypercholesterolemia    Hypothyroidism    MRSA (methicillin resistant Staphylococcus aureus) 03/08/2020   confirmed by PCR   PONV (postoperative nausea and vomiting)    Past Surgical History:  Procedure Laterality Date   ANTERIOR AND POSTERIOR REPAIR  06/24/2012   Procedure: ANTERIOR (CYSTOCELE) AND POSTERIOR REPAIR (RECTOCELE);  Surgeon: Julie DELENA Luna, MD;  Location: [facility];  Service: Gynecology;  Laterality: N/A;  anterior repair   APPENDECTOMY     BLADDER SUSPENSION  06/24/2012   Procedure: TRANSVAGINAL TAPE (TVT) PROCEDURE;  Surgeon: Julie DELENA Luna, MD;  Location: [facility];  Service: Gynecology;  Laterality: N/A;   CHOLECYSTECTOMY     CYSTOSCOPY  06/24/2012   Procedure: CYSTOSCOPY;  Surgeon: Julie DELENA Luna, MD;  Location: [facility];  Service: Gynecology;  Laterality: N/A;   FOOT SURGERY Right    KNEE ARTHROSCOPY W/ MENISCAL REPAIR Right    LAPAROSCOPY  06/24/2012   Procedure: LAPAROSCOPY OPERATIVE;  Surgeon: Julie DELENA Luna, MD;  Location: [facility];  Service: Gynecology;  Laterality: N/A;   TOTAL KNEE ARTHROPLASTY Right 03/13/2020   Procedure: RIGHT TOTAL KNEE  ARTHROPLASTY;  Surgeon: Julie Kay HERO, MD;  Location: [facility];  Service: Orthopedics;  Laterality: Right;   TUBAL LIGATION     VAGINAL HYSTERECTOMY  06/24/2012   Procedure: HYSTERECTOMY VAGINAL;  Surgeon: Julie DELENA Luna, MD;  Location: [facility];  Service: Gynecology;  Laterality: N/A;   Patient Active Problem List   Diagnosis Date Noted   Paroxysmal atrial fibrillation with RVR (HCC) 01/31/2024   Mixed hyperlipidemia 01/31/2024   Acquired hypothyroidism 01/31/2024   Family history of early CAD 01/24/2022   Dyspnea on exertion 01/24/2022   Exertional angina (HCC) 01/24/2022   Status post total right knee replacement 03/13/2020   Primary osteoarthritis of right knee 02/10/2020   S/P right knee arthroscopy 03/03/2019   Sciatica of left side 11/19/2018   Anxiety 07/20/2010   HEMATOCHEZIA 07/20/2010    ONSET DATE: 03/20/24  REFERRING DIAG:  D37.386I (ICD-10-CM) - Closed displaced fracture of proximal phalanx of left middle finger with routine healing, subsequent encounter  S62.627D (ICD-10-CM) - Closed displaced fracture of middle phalanx of left little finger with routine healing, subsequent encounter  S62.615D (ICD-10-CM) - Closed displaced fracture of proximal phalanx of left ring finger with routine healing, subsequent encounter    THERAPY DIAG:  No diagnosis found.  Rationale for Evaluation and Treatment: Rehabilitation  SUBJECTIVE:   SUBJECTIVE STATEMENT: It has been stiff Pt accompanied by: self  PERTINENT HISTORY: Julie Miles is a 73 y.o. female with the following: Proximal phalanx fractures of left long and ring fingers,  Middle phalanx fracture of the left small finger. Pt was immobilized for 6 weeks. Recent x-rays indicate healing well with small noted displacement still visible.   PRECAUTIONS: None  WEIGHT BEARING RESTRICTIONS: No  PAIN:  Are you having pain? Yes: NPRS scale: 6/10 Pain location: L fingers Pain description: aching sharp Aggravating  factors: movement and pressure Relieving factors: rest  FALLS: Has patient fallen in last 6 months? Yes. Number of falls 1  PLOF: Independent  PATIENT GOALS: To improve pain and function  NEXT MD VISIT: 06/09/24  OBJECTIVE:  Note: Objective measures were completed at Evaluation unless otherwise noted.  HAND DOMINANCE: Right  ADLs: Overall ADLs: Pt has difficulty with washing hair, holding towels to dry off after shower, difficulty with clasps, buttons, zippers. Poor grip/grasp on items during cooking and cleaning.  FUNCTIONAL OUTCOME MEASURES: Quick Dash: 50  UPPER EXTREMITY ROM:      Active ROM Left eval  Thumb MCP (0-60) 50  Thumb IP (0-80) 60  Thumb Opposition to Small Finger  able  Index MCP (0-90)  80  Index PIP (0-100)  75  Index DIP (0-70)  35  Long MCP (0-90)  70  Long PIP (0-100)  80  Long DIP (0-70)  30  Ring MCP (0-90) 65   Ring PIP (0-100)  75  Ring DIP (0-70)  20  Little MCP (0-90)  65  Little PIP (0-100)  25  Little DIP (0-70)  20  (Blank rows = not tested)  HAND FUNCTION: Grip strength: Right: 52 lbs; Left: 12 lbs, Lateral pinch: Right: 12 lbs, Left: 9 lbs, and 3 point pinch: Right: 15 lbs, Left: 5 lbs  COORDINATION: 9 Hole Peg test: Right: -- sec; Left: -- sec  SENSATION: WFL  EDEMA: mild swelling noted along digit 2 and 4.   OBSERVATIONS: Index finger is the most stiff and ring finger is the most painful   TREATMENT DATE:   05/21/24 -Manual therapy: myofascial release and edema massage applied to all fingers and palmar aspect of hand in order to reduce fascial restrictions and pain, as well as improve ROM. -Towel crumple, x6 - moving each finger separately -Digit ROM: composite flexion, abduction, finger taps, opposition, x10  -Stretching: OT stretching each finger into flexion 5x15 -Digit Blocking: each joint of each finger x6 -Therabar: yellow, extension/flexion, supination/pronation bends  05/18/24 -Manual therapy: myofascial  release and edema massage applied to all fingers and palmar aspect of hand in order to reduce fascial restrictions and pain, as well as improve ROM. -Digit ROM: composite flexion, abduction, finger taps, opposition, x10  -Towel crumple, x4  -Digit Blocking: each joint of each finger x6 -Gripping squeeze cone x10 -Stretching: OT stretching each finger into flexion 5x15  05/13/24 -Digit ROM: composite flexion, abduction, finger taps, opposition, x10  -Towel crumple, x4  PATIENT EDUCATION: Education details:Continue HEP Person educated: Patient Education method: Programmer, multimedia, Demonstration, and Handouts Education comprehension: verbalized understanding and returned demonstration  HOME EXERCISE PROGRAM: 8/14: Digit ROM and Towel Crumple  GOALS: Goals reviewed with patient? Yes  SHORT TERM GOALS: Target date: 06/18/24  Pt will be provided and educated on comprehensive hep for L hand mobility and strength in order to independently complete ADL's.   Goal status: IN PROGRESS  2.  Pt will decrease pain in the LUE to 2/10 or less in order to sleep 3+ consecutive hours without waking due to pain.  Goal status: IN PROGRESS  3.  Pt will increase digit ROM by 10* in order to make a full fist for grasping items needed during grooming and dressing tasks.   Goal status: IN PROGRESS  4.  Pt will increase LUE grip by 20# and pinch by 2# in order to grasp and hold items during cooking and cleaning tasks without dropping them.  Goal status: IN PROGRESS  5.  Pt will improve LUE coordination by completing the 9 hole peg test in 30 or less, in order to manipulate buttons, zippers, and clasps independently.   Goal status: IN PROGRESS   ASSESSMENT:  CLINICAL IMPRESSION: Pt continuing to have increased stiffness and moderate pain. She is reporting good follow through  of her HEP. Fingers are able to stretch further into flexion this session with pain only in the pinky. OT started pt on therabar's to address grip strength and ability to grasp. Verbal and tactile cuing provided for positioning and technique throughout session.   PERFORMANCE DEFICITS: in functional skills including ADLs, IADLs, coordination, dexterity, sensation, edema, ROM, strength, pain, fascial restrictions, Fine motor control, body mechanics, and UE functional use.   PLAN:  OT FREQUENCY: 2x/week  OT DURATION: 4 weeks  PLANNED INTERVENTIONS: 97168 OT Re-evaluation, 97535 self care/ADL training, 02889 therapeutic exercise, 97530 therapeutic activity, 97112 neuromuscular re-education, 97140 manual therapy, 97035 ultrasound, 97018 paraffin, 02989 moist heat, 97032 electrical stimulation (manual), passive range of motion, functional mobility training, energy conservation, coping strategies training, patient/family education, and DME and/or AE instructions  RECOMMENDED OTHER SERVICES: N/A  CONSULTED AND AGREED WITH PLAN OF CARE: Patient  PLAN FOR NEXT SESSION: stretching, Digit ROM, Digit blocking, grip strengthening  Valentin Nightingale, OTR/L [facility] [phone] Valentin Jillyn Nightingale, OT 05/21/2024, 9:56 AM

## 2024-05-24 ENCOUNTER — Encounter (HOSPITAL_COMMUNITY): Payer: Self-pay | Admitting: Occupational Therapy

## 2024-05-24 ENCOUNTER — Ambulatory Visit (HOSPITAL_COMMUNITY): Payer: Self-pay | Admitting: Occupational Therapy

## 2024-05-24 DIAGNOSIS — R29898 Other symptoms and signs involving the musculoskeletal system: Secondary | ICD-10-CM

## 2024-05-24 DIAGNOSIS — M25641 Stiffness of right hand, not elsewhere classified: Secondary | ICD-10-CM

## 2024-05-24 DIAGNOSIS — S62615D Displaced fracture of proximal phalanx of left ring finger, subsequent encounter for fracture with routine healing: Secondary | ICD-10-CM | POA: Diagnosis not present

## 2024-05-24 DIAGNOSIS — M79644 Pain in right finger(s): Secondary | ICD-10-CM | POA: Diagnosis not present

## 2024-05-24 DIAGNOSIS — S62613D Displaced fracture of proximal phalanx of left middle finger, subsequent encounter for fracture with routine healing: Secondary | ICD-10-CM | POA: Diagnosis not present

## 2024-05-24 DIAGNOSIS — S62627D Displaced fracture of medial phalanx of left little finger, subsequent encounter for fracture with routine healing: Secondary | ICD-10-CM | POA: Diagnosis not present

## 2024-05-24 NOTE — Therapy (Signed)
 OUTPATIENT OCCUPATIONAL THERAPY ORTHO TREATMENT NOTE  Patient Name: Julie Miles MRN: [id number] DOB:October 30, 1950, 73 y.o., female Today's Date: 05/24/2024  PCP: Marvine Rush, MD REFERRING PROVIDER: Onesimo Anes, MD  END OF SESSION:  OT End of Session - 05/24/24 1004     Visit Number 4    Number of Visits 9    Date for OT Re-Evaluation 06/18/24    Authorization Type [employer]    Progress Note Due on Visit 10    OT Start Time 0848    OT Stop Time 0930    OT Time Calculation (min) 42 min    Activity Tolerance Patient tolerated treatment well    Behavior During Therapy [facility] for tasks assessed/performed           Past Medical History:  Diagnosis Date   Anxiety    History of blood transfusion 1975   at [facility] after vag delivery   History of kidney stones    Hypercholesterolemia    Hypothyroidism    MRSA (methicillin resistant Staphylococcus aureus) 03/08/2020   confirmed by PCR   PONV (postoperative nausea and vomiting)    Past Surgical History:  Procedure Laterality Date   ANTERIOR AND POSTERIOR REPAIR  06/24/2012   Procedure: ANTERIOR (CYSTOCELE) AND POSTERIOR REPAIR (RECTOCELE);  Surgeon: Rosaline DELENA Luna, MD;  Location: [facility];  Service: Gynecology;  Laterality: N/A;  anterior repair   APPENDECTOMY     BLADDER SUSPENSION  06/24/2012   Procedure: TRANSVAGINAL TAPE (TVT) PROCEDURE;  Surgeon: Rosaline DELENA Luna, MD;  Location: [facility];  Service: Gynecology;  Laterality: N/A;   CHOLECYSTECTOMY     CYSTOSCOPY  06/24/2012   Procedure: CYSTOSCOPY;  Surgeon: Rosaline DELENA Luna, MD;  Location: [facility];  Service: Gynecology;  Laterality: N/A;   FOOT SURGERY Right    KNEE ARTHROSCOPY W/ MENISCAL REPAIR Right    LAPAROSCOPY  06/24/2012   Procedure: LAPAROSCOPY OPERATIVE;  Surgeon: Rosaline DELENA Luna, MD;  Location: [facility];  Service: Gynecology;  Laterality: N/A;   TOTAL KNEE ARTHROPLASTY Right 03/13/2020   Procedure: RIGHT TOTAL KNEE ARTHROPLASTY;  Surgeon:  Jerri Kay HERO, MD;  Location: [facility];  Service: Orthopedics;  Laterality: Right;   TUBAL LIGATION     VAGINAL HYSTERECTOMY  06/24/2012   Procedure: HYSTERECTOMY VAGINAL;  Surgeon: Rosaline DELENA Luna, MD;  Location: [facility];  Service: Gynecology;  Laterality: N/A;   Patient Active Problem List   Diagnosis Date Noted   Paroxysmal atrial fibrillation with RVR (HCC) 01/31/2024   Mixed hyperlipidemia 01/31/2024   Acquired hypothyroidism 01/31/2024   Family history of early CAD 01/24/2022   Dyspnea on exertion 01/24/2022   Exertional angina (HCC) 01/24/2022   Status post total right knee replacement 03/13/2020   Primary osteoarthritis of right knee 02/10/2020   S/P right knee arthroscopy 03/03/2019   Sciatica of left side 11/19/2018   Anxiety 07/20/2010   HEMATOCHEZIA 07/20/2010    ONSET DATE: 03/20/24  REFERRING DIAG:  D37.386I (ICD-10-CM) - Closed displaced fracture of proximal phalanx of left middle finger with routine healing, subsequent encounter  S62.627D (ICD-10-CM) - Closed displaced fracture of middle phalanx of left little finger with routine healing, subsequent encounter  S62.615D (ICD-10-CM) - Closed displaced fracture of proximal phalanx of left ring finger with routine healing, subsequent encounter    THERAPY DIAG:  Pain in right finger(s)  Finger stiffness, right  Other symptoms and signs involving the musculoskeletal system  Rationale for Evaluation and Treatment: Rehabilitation  SUBJECTIVE:   SUBJECTIVE STATEMENT:  It has been stiff Pt accompanied by: self  PERTINENT HISTORY: Julie Miles is a 73 y.o. female with the following: Proximal phalanx fractures of left long and ring fingers, Middle phalanx fracture of the left small finger. Pt was immobilized for 6 weeks. Recent x-rays indicate healing well with small noted displacement still visible.   PRECAUTIONS: None  WEIGHT BEARING RESTRICTIONS: No  PAIN:  Are you having pain? Yes: NPRS scale: 6/10 Pain  location: L fingers Pain description: aching sharp Aggravating factors: movement and pressure Relieving factors: rest  FALLS: Has patient fallen in last 6 months? Yes. Number of falls 1  PLOF: Independent  PATIENT GOALS: To improve pain and function  NEXT MD VISIT: 06/09/24  OBJECTIVE:  Note: Objective measures were completed at Evaluation unless otherwise noted.  HAND DOMINANCE: Right  ADLs: Overall ADLs: Pt has difficulty with washing hair, holding towels to dry off after shower, difficulty with clasps, buttons, zippers. Poor grip/grasp on items during cooking and cleaning.  FUNCTIONAL OUTCOME MEASURES: Quick Dash: 50  UPPER EXTREMITY ROM:      Active ROM Left eval  Thumb MCP (0-60) 50  Thumb IP (0-80) 60  Thumb Opposition to Small Finger  able  Index MCP (0-90)  80  Index PIP (0-100)  75  Index DIP (0-70)  35  Long MCP (0-90)  70  Long PIP (0-100)  80  Long DIP (0-70)  30  Ring MCP (0-90) 65   Ring PIP (0-100)  75  Ring DIP (0-70)  20  Little MCP (0-90)  65  Little PIP (0-100)  25  Little DIP (0-70)  20  (Blank rows = not tested)  HAND FUNCTION: Grip strength: Right: 52 lbs; Left: 12 lbs, Lateral pinch: Right: 12 lbs, Left: 9 lbs, and 3 point pinch: Right: 15 lbs, Left: 5 lbs  COORDINATION: 9 Hole Peg test: Right: -- sec; Left: -- sec  SENSATION: WFL  EDEMA: mild swelling noted along digit 2 and 4.   OBSERVATIONS: Index finger is the most stiff and ring finger is the most painful   TREATMENT DATE:   05/24/24 -Manual therapy: myofascial release and edema massage applied to all fingers and palmar aspect of hand in order to reduce fascial restrictions and pain, as well as improve ROM. -Stretching: OT stretching each finger into flexion 5x15 -Digit ROM: composite flexion, abduction, finger taps, opposition, x10  -Theraputty: red, roll into a ball, flatten into a pancake, PVC pipe to cut circles, roll into log, tripod pinch, lateral pinch, roll into  ball, squeeze -Gripper: 7#, 15# 8 medium beads each -Paraffin bath with moist heat, 10'  05/21/24 -Manual therapy: myofascial release and edema massage applied to all fingers and palmar aspect of hand in order to reduce fascial restrictions and pain, as well as improve ROM. -Towel crumple, x6 - moving each finger separately -Digit ROM: composite flexion, abduction, finger taps, opposition, x10  -Stretching: OT stretching each finger into flexion 5x15 -Digit Blocking: each joint of each finger x6 -Therabar: yellow, extension/flexion, supination/pronation bends  05/18/24 -Manual therapy: myofascial release and edema massage applied to all fingers and palmar aspect of hand in order to reduce fascial restrictions and pain, as well as improve ROM. -Digit ROM: composite flexion, abduction, finger taps, opposition, x10  -Towel crumple, x4  -Digit Blocking: each joint of each finger x6 -Gripping squeeze cone x10 -Stretching: OT stretching each finger into flexion 5x15  05/13/24 -Digit ROM: composite flexion, abduction, finger taps, opposition, x10  -Towel crumple, x4  PATIENT EDUCATION: Education details:Continue HEP Person educated: Patient Education method: Programmer, multimedia, Demonstration, and Handouts Education comprehension: verbalized understanding and returned demonstration  HOME EXERCISE PROGRAM: 8/14: Digit ROM and Towel Crumple  GOALS: Goals reviewed with patient? Yes  SHORT TERM GOALS: Target date: 06/18/24  Pt will be provided and educated on comprehensive hep for L hand mobility and strength in order to independently complete ADL's.   Goal status: IN PROGRESS  2.  Pt will decrease pain in the LUE to 2/10 or less in order to sleep 3+ consecutive hours without waking due to pain.  Goal status: IN PROGRESS  3.  Pt will increase digit ROM by 10* in  order to make a full fist for grasping items needed during grooming and dressing tasks.   Goal status: IN PROGRESS  4.  Pt will increase LUE grip by 20# and pinch by 2# in order to grasp and hold items during cooking and cleaning tasks without dropping them.  Goal status: IN PROGRESS  5.  Pt will improve LUE coordination by completing the 9 hole peg test in 30 or less, in order to manipulate buttons, zippers, and clasps independently.   Goal status: IN PROGRESS   ASSESSMENT:  CLINICAL IMPRESSION: This session pt presented with increased pain and stiffness. She demonstrated poor tolerance of digits 3-5 being stretched into flexion with decreased ROM as well. Due to pain and stiffness, OT had pt complete paraffin bath for pain relief and improved mobility at end of session. Verbal and tactile cuing provided for positioning and technique throughout session.   PERFORMANCE DEFICITS: in functional skills including ADLs, IADLs, coordination, dexterity, sensation, edema, ROM, strength, pain, fascial restrictions, Fine motor control, body mechanics, and UE functional use.   PLAN:  OT FREQUENCY: 2x/week  OT DURATION: 4 weeks  PLANNED INTERVENTIONS: 97168 OT Re-evaluation, 97535 self care/ADL training, 02889 therapeutic exercise, 97530 therapeutic activity, 97112 neuromuscular re-education, 97140 manual therapy, 97035 ultrasound, 97018 paraffin, 02989 moist heat, 97032 electrical stimulation (manual), passive range of motion, functional mobility training, energy conservation, coping strategies training, patient/family education, and DME and/or AE instructions  RECOMMENDED OTHER SERVICES: N/A  CONSULTED AND AGREED WITH PLAN OF CARE: Patient  PLAN FOR NEXT SESSION: stretching, Digit ROM, Digit blocking, grip strengthening  Valentin Nightingale, OTR/L [facility] [phone] Valentin Jillyn Nightingale, OT 05/24/2024, 10:07 AM

## 2024-05-26 ENCOUNTER — Encounter (HOSPITAL_COMMUNITY): Payer: Self-pay | Admitting: Occupational Therapy

## 2024-05-26 ENCOUNTER — Ambulatory Visit (HOSPITAL_COMMUNITY): Payer: Self-pay | Admitting: Occupational Therapy

## 2024-05-26 ENCOUNTER — Encounter: Admitting: Orthopedic Surgery

## 2024-05-26 DIAGNOSIS — M79644 Pain in right finger(s): Secondary | ICD-10-CM

## 2024-05-26 DIAGNOSIS — R29898 Other symptoms and signs involving the musculoskeletal system: Secondary | ICD-10-CM

## 2024-05-26 DIAGNOSIS — S62613D Displaced fracture of proximal phalanx of left middle finger, subsequent encounter for fracture with routine healing: Secondary | ICD-10-CM | POA: Diagnosis not present

## 2024-05-26 DIAGNOSIS — S62627D Displaced fracture of medial phalanx of left little finger, subsequent encounter for fracture with routine healing: Secondary | ICD-10-CM | POA: Diagnosis not present

## 2024-05-26 DIAGNOSIS — S62615D Displaced fracture of proximal phalanx of left ring finger, subsequent encounter for fracture with routine healing: Secondary | ICD-10-CM | POA: Diagnosis not present

## 2024-05-26 DIAGNOSIS — M25641 Stiffness of right hand, not elsewhere classified: Secondary | ICD-10-CM | POA: Diagnosis not present

## 2024-05-26 NOTE — Therapy (Signed)
 OUTPATIENT OCCUPATIONAL THERAPY ORTHO TREATMENT NOTE  Patient Name: Julie Miles MRN: [id number] DOB:03-Mar-1951, 73 y.o., female Today's Date: 05/26/2024  PCP: Marvine Rush, MD REFERRING PROVIDER: Onesimo Anes, MD  END OF SESSION:  OT End of Session - 05/26/24 0932     Visit Number 5    Number of Visits 9    Date for OT Re-Evaluation 06/18/24    Authorization Type [employer]    Progress Note Due on Visit 10    OT Start Time [phone]    OT Stop Time 0931    OT Time Calculation (min) 39 min    Activity Tolerance Patient tolerated treatment well    Behavior During Therapy [facility] for tasks assessed/performed            Past Medical History:  Diagnosis Date   Anxiety    History of blood transfusion 1975   at [facility] after vag delivery   History of kidney stones    Hypercholesterolemia    Hypothyroidism    MRSA (methicillin resistant Staphylococcus aureus) 03/08/2020   confirmed by PCR   PONV (postoperative nausea and vomiting)    Past Surgical History:  Procedure Laterality Date   ANTERIOR AND POSTERIOR REPAIR  06/24/2012   Procedure: ANTERIOR (CYSTOCELE) AND POSTERIOR REPAIR (RECTOCELE);  Surgeon: Rosaline DELENA Luna, MD;  Location: [facility];  Service: Gynecology;  Laterality: N/A;  anterior repair   APPENDECTOMY     BLADDER SUSPENSION  06/24/2012   Procedure: TRANSVAGINAL TAPE (TVT) PROCEDURE;  Surgeon: Rosaline DELENA Luna, MD;  Location: [facility];  Service: Gynecology;  Laterality: N/A;   CHOLECYSTECTOMY     CYSTOSCOPY  06/24/2012   Procedure: CYSTOSCOPY;  Surgeon: Rosaline DELENA Luna, MD;  Location: [facility];  Service: Gynecology;  Laterality: N/A;   FOOT SURGERY Right    KNEE ARTHROSCOPY W/ MENISCAL REPAIR Right    LAPAROSCOPY  06/24/2012   Procedure: LAPAROSCOPY OPERATIVE;  Surgeon: Rosaline DELENA Luna, MD;  Location: [facility];  Service: Gynecology;  Laterality: N/A;   TOTAL KNEE ARTHROPLASTY Right 03/13/2020   Procedure: RIGHT TOTAL KNEE ARTHROPLASTY;  Surgeon:  Jerri Kay HERO, MD;  Location: [facility];  Service: Orthopedics;  Laterality: Right;   TUBAL LIGATION     VAGINAL HYSTERECTOMY  06/24/2012   Procedure: HYSTERECTOMY VAGINAL;  Surgeon: Rosaline DELENA Luna, MD;  Location: [facility];  Service: Gynecology;  Laterality: N/A;   Patient Active Problem List   Diagnosis Date Noted   Paroxysmal atrial fibrillation with RVR (HCC) 01/31/2024   Mixed hyperlipidemia 01/31/2024   Acquired hypothyroidism 01/31/2024   Family history of early CAD 01/24/2022   Dyspnea on exertion 01/24/2022   Exertional angina (HCC) 01/24/2022   Status post total right knee replacement 03/13/2020   Primary osteoarthritis of right knee 02/10/2020   S/P right knee arthroscopy 03/03/2019   Sciatica of left side 11/19/2018   Anxiety 07/20/2010   HEMATOCHEZIA 07/20/2010    ONSET DATE: 03/20/24  REFERRING DIAG:  D37.386I (ICD-10-CM) - Closed displaced fracture of proximal phalanx of left middle finger with routine healing, subsequent encounter  S62.627D (ICD-10-CM) - Closed displaced fracture of middle phalanx of left little finger with routine healing, subsequent encounter  S62.615D (ICD-10-CM) - Closed displaced fracture of proximal phalanx of left ring finger with routine healing, subsequent encounter    THERAPY DIAG:  Pain in right finger(s)  Finger stiffness, right  Other symptoms and signs involving the musculoskeletal system  Rationale for Evaluation and Treatment: Rehabilitation  SUBJECTIVE:   SUBJECTIVE  STATEMENT: It has been stiff Pt accompanied by: self  PERTINENT HISTORY: Julie Miles is a 74 y.o. female with the following: Proximal phalanx fractures of left long and ring fingers, Middle phalanx fracture of the left small finger. Pt was immobilized for 6 weeks. Recent x-rays indicate healing well with small noted displacement still visible.   PRECAUTIONS: None  WEIGHT BEARING RESTRICTIONS: No  PAIN:  Are you having pain? Yes: NPRS scale: 6/10 Pain  location: L fingers Pain description: aching sharp Aggravating factors: movement and pressure Relieving factors: rest  FALLS: Has patient fallen in last 6 months? Yes. Number of falls 1  PLOF: Independent  PATIENT GOALS: To improve pain and function  NEXT MD VISIT: 06/09/24  OBJECTIVE:  Note: Objective measures were completed at Evaluation unless otherwise noted.  HAND DOMINANCE: Right  ADLs: Overall ADLs: Pt has difficulty with washing hair, holding towels to dry off after shower, difficulty with clasps, buttons, zippers. Poor grip/grasp on items during cooking and cleaning.  FUNCTIONAL OUTCOME MEASURES: Quick Dash: 50  UPPER EXTREMITY ROM:      Active ROM Left eval  Thumb MCP (0-60) 50  Thumb IP (0-80) 60  Thumb Opposition to Small Finger  able  Index MCP (0-90)  80  Index PIP (0-100)  75  Index DIP (0-70)  35  Long MCP (0-90)  70  Long PIP (0-100)  80  Long DIP (0-70)  30  Ring MCP (0-90) 65   Ring PIP (0-100)  75  Ring DIP (0-70)  20  Little MCP (0-90)  65  Little PIP (0-100)  25  Little DIP (0-70)  20  (Blank rows = not tested)  HAND FUNCTION: Grip strength: Right: 52 lbs; Left: 12 lbs, Lateral pinch: Right: 12 lbs, Left: 9 lbs, and 3 point pinch: Right: 15 lbs, Left: 5 lbs  COORDINATION: 9 Hole Peg test: Right: -- sec; Left: -- sec  SENSATION: WFL  EDEMA: mild swelling noted along digit 2 and 4.   OBSERVATIONS: Index finger is the most stiff and ring finger is the most painful   TREATMENT DATE:   05/26/24 -Digit Blocking: each joint of each finger x6 -Digit ROM: composite flexion, abduction, finger taps, opposition, x10  -Therabar: yellow bar, extension/flexion twists, supination and pronation bends, radial and ulnar bends, x10 -Theraputty: red putty, placing 10 beads in putty, roll into a ball, pinch and pull to find all 10 beads -stacking cubes: red resistance clip, 4 stacks of 5 cubes  05/24/24 -Manual therapy: myofascial release and edema  massage applied to all fingers and palmar aspect of hand in order to reduce fascial restrictions and pain, as well as improve ROM. -Stretching: OT stretching each finger into flexion 5x15 -Digit ROM: composite flexion, abduction, finger taps, opposition, x10  -Theraputty: red, roll into a ball, flatten into a pancake, PVC pipe to cut circles, roll into log, tripod pinch, lateral pinch, roll into ball, squeeze -Gripper: 7#, 15# 8 medium beads each -Paraffin bath with moist heat, 10'  05/21/24 -Manual therapy: myofascial release and edema massage applied to all fingers and palmar aspect of hand in order to reduce fascial restrictions and pain, as well as improve ROM. -Towel crumple, x6 - moving each finger separately -Digit ROM: composite flexion, abduction, finger taps, opposition, x10  -Stretching: OT stretching each finger into flexion 5x15 -Digit Blocking: each joint of each finger x6 -Therabar: yellow, extension/flexion, supination/pronation bends  PATIENT EDUCATION: Education details:Continue HEP Person educated: Patient Education method: Programmer, multimedia, Demonstration, and Handouts Education comprehension: verbalized understanding and returned demonstration  HOME EXERCISE PROGRAM: 8/14: Digit ROM and Towel Crumple  GOALS: Goals reviewed with patient? Yes  SHORT TERM GOALS: Target date: 06/18/24  Pt will be provided and educated on comprehensive hep for L hand mobility and strength in order to independently complete ADL's.   Goal status: IN PROGRESS  2.  Pt will decrease pain in the LUE to 2/10 or less in order to sleep 3+ consecutive hours without waking due to pain.  Goal status: IN PROGRESS  3.  Pt will increase digit ROM by 10* in order to make a full fist for grasping items needed during grooming and dressing tasks.   Goal status: IN PROGRESS  4.  Pt will increase LUE grip by 20#  and pinch by 2# in order to grasp and hold items during cooking and cleaning tasks without dropping them.  Goal status: IN PROGRESS  5.  Pt will improve LUE coordination by completing the 9 hole peg test in 30 or less, in order to manipulate buttons, zippers, and clasps independently.   Goal status: IN PROGRESS   ASSESSMENT:  CLINICAL IMPRESSION: Pt presenting to this session reporting soreness from increased exercises at home. She was able to flex each finger a little further this session, achieving approximately 75% of a full fist. Tolerating theraputty resistance better this session without requiring a break. Verbal and tactile cuing for positioning and technique.   PERFORMANCE DEFICITS: in functional skills including ADLs, IADLs, coordination, dexterity, sensation, edema, ROM, strength, pain, fascial restrictions, Fine motor control, body mechanics, and UE functional use.   PLAN:  OT FREQUENCY: 2x/week  OT DURATION: 4 weeks  PLANNED INTERVENTIONS: 97168 OT Re-evaluation, 97535 self care/ADL training, 02889 therapeutic exercise, 97530 therapeutic activity, 97112 neuromuscular re-education, 97140 manual therapy, 97035 ultrasound, 97018 paraffin, 02989 moist heat, 97032 electrical stimulation (manual), passive range of motion, functional mobility training, energy conservation, coping strategies training, patient/family education, and DME and/or AE instructions  RECOMMENDED OTHER SERVICES: N/A  CONSULTED AND AGREED WITH PLAN OF CARE: Patient  PLAN FOR NEXT SESSION: stretching, Digit ROM, Digit blocking, grip strengthening  Valentin Nightingale, OTR/L [facility] [phone] Valentin Jillyn Nightingale, OT 05/26/2024, 9:33 AM

## 2024-06-01 ENCOUNTER — Encounter (HOSPITAL_COMMUNITY): Payer: Self-pay | Admitting: Occupational Therapy

## 2024-06-01 ENCOUNTER — Ambulatory Visit (HOSPITAL_COMMUNITY): Payer: Self-pay | Admitting: Occupational Therapy

## 2024-06-01 ENCOUNTER — Ambulatory Visit (HOSPITAL_COMMUNITY): Attending: Orthopedic Surgery | Admitting: Occupational Therapy

## 2024-06-01 DIAGNOSIS — M25641 Stiffness of right hand, not elsewhere classified: Secondary | ICD-10-CM | POA: Insufficient documentation

## 2024-06-01 DIAGNOSIS — R29898 Other symptoms and signs involving the musculoskeletal system: Secondary | ICD-10-CM | POA: Insufficient documentation

## 2024-06-01 DIAGNOSIS — M79644 Pain in right finger(s): Secondary | ICD-10-CM | POA: Insufficient documentation

## 2024-06-01 NOTE — Therapy (Signed)
 OUTPATIENT OCCUPATIONAL THERAPY ORTHO TREATMENT NOTE  Patient Name: Julie Miles MRN: [id number] DOB:[birth year], 73 y.o., female Today's Date: 06/01/2024  PCP: Marvine Rush, MD REFERRING PROVIDER: Onesimo Anes, MD  END OF SESSION:  OT End of Session - 06/01/24 1203     Visit Number 6    Number of Visits 9    Date for OT Re-Evaluation 06/18/24    Authorization Type [employer]    Progress Note Due on Visit 10    OT Start Time 1121    OT Stop Time 1203    OT Time Calculation (min) 42 min    Activity Tolerance Patient tolerated treatment well    Behavior During Therapy [facility] for tasks assessed/performed          Past Medical History:  Diagnosis Date   Anxiety    History of blood transfusion 1975   at [facility] after vag delivery   History of kidney stones    Hypercholesterolemia    Hypothyroidism    MRSA (methicillin resistant Staphylococcus aureus) 03/08/2020   confirmed by PCR   PONV (postoperative nausea and vomiting)    Past Surgical History:  Procedure Laterality Date   ANTERIOR AND POSTERIOR REPAIR  06/24/2012   Procedure: ANTERIOR (CYSTOCELE) AND POSTERIOR REPAIR (RECTOCELE);  Surgeon: Rosaline DELENA Luna, MD;  Location: [facility];  Service: Gynecology;  Laterality: N/A;  anterior repair   APPENDECTOMY     BLADDER SUSPENSION  06/24/2012   Procedure: TRANSVAGINAL TAPE (TVT) PROCEDURE;  Surgeon: Rosaline DELENA Luna, MD;  Location: [facility];  Service: Gynecology;  Laterality: N/A;   CHOLECYSTECTOMY     CYSTOSCOPY  06/24/2012   Procedure: CYSTOSCOPY;  Surgeon: Rosaline DELENA Luna, MD;  Location: [facility];  Service: Gynecology;  Laterality: N/A;   FOOT SURGERY Right    KNEE ARTHROSCOPY W/ MENISCAL REPAIR Right    LAPAROSCOPY  06/24/2012   Procedure: LAPAROSCOPY OPERATIVE;  Surgeon: Rosaline DELENA Luna, MD;  Location: [facility];  Service: Gynecology;  Laterality: N/A;   TOTAL KNEE ARTHROPLASTY Right 03/13/2020   Procedure: RIGHT TOTAL KNEE ARTHROPLASTY;  Surgeon: Jerri Kay HERO, MD;  Location: [facility];  Service: Orthopedics;  Laterality: Right;   TUBAL LIGATION     VAGINAL HYSTERECTOMY  06/24/2012   Procedure: HYSTERECTOMY VAGINAL;  Surgeon: Rosaline DELENA Luna, MD;  Location: [facility];  Service: Gynecology;  Laterality: N/A;   Patient Active Problem List   Diagnosis Date Noted   Paroxysmal atrial fibrillation with RVR (HCC) 01/31/2024   Mixed hyperlipidemia 01/31/2024   Acquired hypothyroidism 01/31/2024   Family history of early CAD 01/24/2022   Dyspnea on exertion 01/24/2022   Exertional angina (HCC) 01/24/2022   Status post total right knee replacement 03/13/2020   Primary osteoarthritis of right knee 02/10/2020   S/P right knee arthroscopy 03/03/2019   Sciatica of left side 11/19/2018   Anxiety 07/20/2010   HEMATOCHEZIA 07/20/2010    ONSET DATE: 03/20/24  REFERRING DIAG:  D37.386I (ICD-10-CM) - Closed displaced fracture of proximal phalanx of left middle finger with routine healing, subsequent encounter  S62.627D (ICD-10-CM) - Closed displaced fracture of middle phalanx of left little finger with routine healing, subsequent encounter  S62.615D (ICD-10-CM) - Closed displaced fracture of proximal phalanx of left ring finger with routine healing, subsequent encounter    THERAPY DIAG:  Pain in right finger(s)  Finger stiffness, right  Other symptoms and signs involving the musculoskeletal system  Rationale for Evaluation and Treatment: Rehabilitation  SUBJECTIVE:   SUBJECTIVE STATEMENT: The  inside of my hand hurts Pt accompanied by: self  PERTINENT HISTORY: Julie Miles is a 73 y.o. female with the following: Proximal phalanx fractures of left long and ring fingers, Middle phalanx fracture of the left small finger. Pt was immobilized for 6 weeks. Recent x-rays indicate healing well with small noted displacement still visible.   PRECAUTIONS: None  WEIGHT BEARING RESTRICTIONS: No  PAIN:  Are you having pain? Yes: NPRS scale:  6/10 Pain location: L fingers Pain description: aching sharp Aggravating factors: movement and pressure Relieving factors: rest  FALLS: Has patient fallen in last 6 months? Yes. Number of falls 1  PLOF: Independent  PATIENT GOALS: To improve pain and function  NEXT MD VISIT: 06/09/24  OBJECTIVE:  Note: Objective measures were completed at Evaluation unless otherwise noted.  HAND DOMINANCE: Right  ADLs: Overall ADLs: Pt has difficulty with washing hair, holding towels to dry off after shower, difficulty with clasps, buttons, zippers. Poor grip/grasp on items during cooking and cleaning.  FUNCTIONAL OUTCOME MEASURES: Quick Dash: 50  UPPER EXTREMITY ROM:      Active ROM Left eval  Thumb MCP (0-60) 50  Thumb IP (0-80) 60  Thumb Opposition to Small Finger  able  Index MCP (0-90)  80  Index PIP (0-100)  75  Index DIP (0-70)  35  Long MCP (0-90)  70  Long PIP (0-100)  80  Long DIP (0-70)  30  Ring MCP (0-90) 65   Ring PIP (0-100)  75  Ring DIP (0-70)  20  Little MCP (0-90)  65  Little PIP (0-100)  25  Little DIP (0-70)  20  (Blank rows = not tested)  HAND FUNCTION: Grip strength: Right: 52 lbs; Left: 12 lbs, Lateral pinch: Right: 12 lbs, Left: 9 lbs, and 3 point pinch: Right: 15 lbs, Left: 5 lbs  COORDINATION: 9 Hole Peg test: Right: -- sec; Left: -- sec  SENSATION: WFL  EDEMA: mild swelling noted along digit 2 and 4.   OBSERVATIONS: Index finger is the most stiff and ring finger is the most painful   TREATMENT DATE:   06/01/24 -Manual therapy: myofascial release and edema massage applied to all fingers and palmar aspect of hand in order to reduce fascial restrictions and pain, as well as improve ROM. -Stretching: OT stretching each finger into flexion 5x15 -Self stretching: interphalangeal stretch, extension stretch, and flexion stretch along MCP's.  -Pinch tree: red, green, blue resistance clips, tripod pinch up and lateral pinch down -grooved peg  board -Gripper: 7#, 15# 8 medium beads each  05/26/24 -Digit Blocking: each joint of each finger x6 -Digit ROM: composite flexion, abduction, finger taps, opposition, x10  -Therabar: yellow bar, extension/flexion twists, supination and pronation bends, radial and ulnar bends, x10 -Theraputty: red putty, placing 10 beads in putty, roll into a ball, pinch and pull to find all 10 beads -stacking cubes: red resistance clip, 4 stacks of 5 cubes  05/24/24 -Manual therapy: myofascial release and edema massage applied to all fingers and palmar aspect of hand in order to reduce fascial restrictions and pain, as well as improve ROM. -Stretching: OT stretching each finger into flexion 5x15 -Digit ROM: composite flexion, abduction, finger taps, opposition, x10  -Theraputty: red, roll into a ball, flatten into a pancake, PVC pipe to cut circles, roll into log, tripod pinch, lateral pinch, roll into ball, squeeze -Gripper: 7#, 15# 8 medium beads each -Paraffin bath with moist heat, 10'    PATIENT EDUCATION: Education details:Finger Stretches Person educated:  Patient Education method: Explanation, Demonstration, and Handouts Education comprehension: verbalized understanding and returned demonstration  HOME EXERCISE PROGRAM: 8/14: Digit ROM and Towel Crumple 9/2: Finger stretches  GOALS: Goals reviewed with patient? Yes  SHORT TERM GOALS: Target date: 06/18/24  Pt will be provided and educated on comprehensive hep for L hand mobility and strength in order to independently complete ADL's.   Goal status: IN PROGRESS  2.  Pt will decrease pain in the LUE to 2/10 or less in order to sleep 3+ consecutive hours without waking due to pain.  Goal status: IN PROGRESS  3.  Pt will increase digit ROM by 10* in order to make a full fist for grasping items needed during grooming and dressing tasks.   Goal status: IN PROGRESS  4.  Pt will increase LUE grip by 20# and pinch by 2# in order to grasp and  hold items during cooking and cleaning tasks without dropping them.  Goal status: IN PROGRESS  5.  Pt will improve LUE coordination by completing the 9 hole peg test in 30 or less, in order to manipulate buttons, zippers, and clasps independently.   Goal status: IN PROGRESS   ASSESSMENT:  CLINICAL IMPRESSION: Pt reporting more pain this session in her hand. OT providing manual therapy to decrease the restrictions in her palm and interphalangeals, as well as providing pain relief. Pt also working on improving her grip strength and fine motor skills. She had moderate difficulty with the grooved peg board and decreased strength with her grip. Verbal and tactile cuing provided for positioning and technique.   PERFORMANCE DEFICITS: in functional skills including ADLs, IADLs, coordination, dexterity, sensation, edema, ROM, strength, pain, fascial restrictions, Fine motor control, body mechanics, and UE functional use.   PLAN:  OT FREQUENCY: 2x/week  OT DURATION: 4 weeks  PLANNED INTERVENTIONS: 97168 OT Re-evaluation, 97535 self care/ADL training, 02889 therapeutic exercise, 97530 therapeutic activity, 97112 neuromuscular re-education, 97140 manual therapy, 97035 ultrasound, 97018 paraffin, 02989 moist heat, 97032 electrical stimulation (manual), passive range of motion, functional mobility training, energy conservation, coping strategies training, patient/family education, and DME and/or AE instructions  RECOMMENDED OTHER SERVICES: N/A  CONSULTED AND AGREED WITH PLAN OF CARE: Patient  PLAN FOR NEXT SESSION: stretching, Digit ROM, Digit blocking, grip strengthening  Valentin Nightingale, OTR/L [facility] [phone] Akirah Storck Jillyn Nightingale, OT 06/01/2024, 1:26 PM

## 2024-06-03 ENCOUNTER — Encounter (HOSPITAL_COMMUNITY): Payer: Self-pay | Admitting: Occupational Therapy

## 2024-06-03 ENCOUNTER — Ambulatory Visit (HOSPITAL_COMMUNITY): Payer: Self-pay | Admitting: Occupational Therapy

## 2024-06-03 DIAGNOSIS — R29898 Other symptoms and signs involving the musculoskeletal system: Secondary | ICD-10-CM

## 2024-06-03 DIAGNOSIS — M79644 Pain in right finger(s): Secondary | ICD-10-CM | POA: Diagnosis not present

## 2024-06-03 DIAGNOSIS — M25641 Stiffness of right hand, not elsewhere classified: Secondary | ICD-10-CM

## 2024-06-03 NOTE — Therapy (Signed)
 OUTPATIENT OCCUPATIONAL THERAPY ORTHO TREATMENT NOTE  Patient Name: Julie Miles MRN: [id number] DOB:April 28, 1951, 73 y.o., female Today's Date: 06/03/2024  PCP: Marvine Rush, MD REFERRING PROVIDER: Onesimo Anes, MD  END OF SESSION:  OT End of Session - 06/03/24 0942     Visit Number 7    Number of Visits 9    Date for OT Re-Evaluation 06/18/24    Authorization Type [employer]    Progress Note Due on Visit 10    OT Start Time 0902    OT Stop Time [phone]    OT Time Calculation (min) 40 min    Activity Tolerance Patient tolerated treatment well    Behavior During Therapy [facility] for tasks assessed/performed           Past Medical History:  Diagnosis Date   Anxiety    History of blood transfusion 1975   at [facility] after vag delivery   History of kidney stones    Hypercholesterolemia    Hypothyroidism    MRSA (methicillin resistant Staphylococcus aureus) 03/08/2020   confirmed by PCR   PONV (postoperative nausea and vomiting)    Past Surgical History:  Procedure Laterality Date   ANTERIOR AND POSTERIOR REPAIR  06/24/2012   Procedure: ANTERIOR (CYSTOCELE) AND POSTERIOR REPAIR (RECTOCELE);  Surgeon: Rosaline DELENA Luna, MD;  Location: [facility];  Service: Gynecology;  Laterality: N/A;  anterior repair   APPENDECTOMY     BLADDER SUSPENSION  06/24/2012   Procedure: TRANSVAGINAL TAPE (TVT) PROCEDURE;  Surgeon: Rosaline DELENA Luna, MD;  Location: [facility];  Service: Gynecology;  Laterality: N/A;   CHOLECYSTECTOMY     CYSTOSCOPY  06/24/2012   Procedure: CYSTOSCOPY;  Surgeon: Rosaline DELENA Luna, MD;  Location: [facility];  Service: Gynecology;  Laterality: N/A;   FOOT SURGERY Right    KNEE ARTHROSCOPY W/ MENISCAL REPAIR Right    LAPAROSCOPY  06/24/2012   Procedure: LAPAROSCOPY OPERATIVE;  Surgeon: Rosaline DELENA Luna, MD;  Location: [facility];  Service: Gynecology;  Laterality: N/A;   TOTAL KNEE ARTHROPLASTY Right 03/13/2020   Procedure: RIGHT TOTAL KNEE ARTHROPLASTY;  Surgeon:  Jerri Kay HERO, MD;  Location: [facility];  Service: Orthopedics;  Laterality: Right;   TUBAL LIGATION     VAGINAL HYSTERECTOMY  06/24/2012   Procedure: HYSTERECTOMY VAGINAL;  Surgeon: Rosaline DELENA Luna, MD;  Location: [facility];  Service: Gynecology;  Laterality: N/A;   Patient Active Problem List   Diagnosis Date Noted   Paroxysmal atrial fibrillation with RVR (HCC) 01/31/2024   Mixed hyperlipidemia 01/31/2024   Acquired hypothyroidism 01/31/2024   Family history of early CAD 01/24/2022   Dyspnea on exertion 01/24/2022   Exertional angina (HCC) 01/24/2022   Status post total right knee replacement 03/13/2020   Primary osteoarthritis of right knee 02/10/2020   S/P right knee arthroscopy 03/03/2019   Sciatica of left side 11/19/2018   Anxiety 07/20/2010   HEMATOCHEZIA 07/20/2010    ONSET DATE: 03/20/24  REFERRING DIAG:  D37.386I (ICD-10-CM) - Closed displaced fracture of proximal phalanx of left middle finger with routine healing, subsequent encounter  S62.627D (ICD-10-CM) - Closed displaced fracture of middle phalanx of left little finger with routine healing, subsequent encounter  S62.615D (ICD-10-CM) - Closed displaced fracture of proximal phalanx of left ring finger with routine healing, subsequent encounter    THERAPY DIAG:  Pain in right finger(s)  Finger stiffness, right  Other symptoms and signs involving the musculoskeletal system  Rationale for Evaluation and Treatment: Rehabilitation  SUBJECTIVE:   SUBJECTIVE STATEMENT:  It still feels the same Pt accompanied by: self  PERTINENT HISTORY: Julie Miles is a 73 y.o. female with the following: Proximal phalanx fractures of left long and ring fingers, Middle phalanx fracture of the left small finger. Pt was immobilized for 6 weeks. Recent x-rays indicate healing well with small noted displacement still visible.   PRECAUTIONS: None  WEIGHT BEARING RESTRICTIONS: No  PAIN:  Are you having pain? Yes: NPRS scale:  7/10 Pain location: L fingers Pain description: aching sharp Aggravating factors: movement and pressure Relieving factors: rest  FALLS: Has patient fallen in last 6 months? Yes. Number of falls 1  PLOF: Independent  PATIENT GOALS: To improve pain and function  NEXT MD VISIT: 06/09/24  OBJECTIVE:  Note: Objective measures were completed at Evaluation unless otherwise noted.  HAND DOMINANCE: Right  ADLs: Overall ADLs: Pt has difficulty with washing hair, holding towels to dry off after shower, difficulty with clasps, buttons, zippers. Poor grip/grasp on items during cooking and cleaning.  FUNCTIONAL OUTCOME MEASURES: Quick Dash: 50  UPPER EXTREMITY ROM:      Active ROM Left eval  Thumb MCP (0-60) 50  Thumb IP (0-80) 60  Thumb Opposition to Small Finger  able  Index MCP (0-90)  80  Index PIP (0-100)  75  Index DIP (0-70)  35  Long MCP (0-90)  70  Long PIP (0-100)  80  Long DIP (0-70)  30  Ring MCP (0-90) 65   Ring PIP (0-100)  75  Ring DIP (0-70)  20  Little MCP (0-90)  65  Little PIP (0-100)  25  Little DIP (0-70)  20  (Blank rows = not tested)  HAND FUNCTION: Grip strength: Right: 52 lbs; Left: 12 lbs, Lateral pinch: Right: 12 lbs, Left: 9 lbs, and 3 point pinch: Right: 15 lbs, Left: 5 lbs  COORDINATION: 9 Hole Peg test: Right: -- sec; Left: -- sec  SENSATION: WFL  EDEMA: mild swelling noted along digit 2 and 4.   OBSERVATIONS: Index finger is the most stiff and ring finger is the most painful   TREATMENT DATE:   06/03/24 -Manual therapy: myofascial release and edema massage applied to all fingers and palmar aspect of hand in order to reduce fascial restrictions and pain, as well as improve ROM. -Stretching: OT stretching each finger into flexion 5x15 -Self stretching: interphalangeal stretch, extension stretch, and flexion stretch along MCP's.  -Pt provided and fitted for flexion glove -Sponges: 17, 21 -Towel crumple x2 -Coins: holding 10 coins and  placing in coin bank, dropped 2-5 each time  06/01/24 -Manual therapy: myofascial release and edema massage applied to all fingers and palmar aspect of hand in order to reduce fascial restrictions and pain, as well as improve ROM. -Stretching: OT stretching each finger into flexion 5x15 -Self stretching: interphalangeal stretch, extension stretch, and flexion stretch along MCP's.  -Pinch tree: red, green, blue resistance clips, tripod pinch up and lateral pinch down -grooved peg board -Gripper: 7#, 15# 8 medium beads each  05/26/24 -Digit Blocking: each joint of each finger x6 -Digit ROM: composite flexion, abduction, finger taps, opposition, x10  -Therabar: yellow bar, extension/flexion twists, supination and pronation bends, radial and ulnar bends, x10 -Theraputty: red putty, placing 10 beads in putty, roll into a ball, pinch and pull to find all 10 beads -stacking cubes: red resistance clip, 4 stacks of 5 cubes   PATIENT EDUCATION: Education details:Theraputty Person educated: Patient Education method: Explanation, Demonstration, and Handouts Education comprehension: verbalized understanding and returned  demonstration  HOME EXERCISE PROGRAM: 8/14: Digit ROM and Towel Crumple 9/2: Finger stretches 9/4: Theraputty  GOALS: Goals reviewed with patient? Yes  SHORT TERM GOALS: Target date: 06/18/24  Pt will be provided and educated on comprehensive hep for L hand mobility and strength in order to independently complete ADL's.   Goal status: IN PROGRESS  2.  Pt will decrease pain in the LUE to 2/10 or less in order to sleep 3+ consecutive hours without waking due to pain.  Goal status: IN PROGRESS  3.  Pt will increase digit ROM by 10* in order to make a full fist for grasping items needed during grooming and dressing tasks.   Goal status: IN PROGRESS  4.  Pt will increase LUE grip by 20# and pinch by 2# in order to grasp and hold items during cooking and cleaning tasks without  dropping them.  Goal status: IN PROGRESS  5.  Pt will improve LUE coordination by completing the 9 hole peg test in 30 or less, in order to manipulate buttons, zippers, and clasps independently.   Goal status: IN PROGRESS   ASSESSMENT:  CLINICAL IMPRESSION: This session pt having continued pain and difficulty with full grasps and grip strength. OT provided pt with a flexion glove to assist with stretching her fingers into flexion and improving her ability make a fist. She has the most pain with her middle and ring fingers flexing at the MCP joints and DIP joints. Verbal and tactile cuing provided for positioning and technique throughout session.   PERFORMANCE DEFICITS: in functional skills including ADLs, IADLs, coordination, dexterity, sensation, edema, ROM, strength, pain, fascial restrictions, Fine motor control, body mechanics, and UE functional use.   PLAN:  OT FREQUENCY: 2x/week  OT DURATION: 4 weeks  PLANNED INTERVENTIONS: 97168 OT Re-evaluation, 97535 self care/ADL training, 02889 therapeutic exercise, 97530 therapeutic activity, 97112 neuromuscular re-education, 97140 manual therapy, 97035 ultrasound, 97018 paraffin, 02989 moist heat, 97032 electrical stimulation (manual), passive range of motion, functional mobility training, energy conservation, coping strategies training, patient/family education, and DME and/or AE instructions  RECOMMENDED OTHER SERVICES: N/A  CONSULTED AND AGREED WITH PLAN OF CARE: Patient  PLAN FOR NEXT SESSION: stretching, Digit ROM, Digit blocking, grip strengthening  Julie Miles, OTR/L [facility] [phone] Nkenge Sonntag Jillyn Miles, OT 06/03/2024, 2:03 PM

## 2024-06-07 ENCOUNTER — Ambulatory Visit (HOSPITAL_COMMUNITY): Payer: Self-pay | Admitting: Occupational Therapy

## 2024-06-07 ENCOUNTER — Encounter (HOSPITAL_COMMUNITY): Payer: Self-pay | Admitting: Occupational Therapy

## 2024-06-07 DIAGNOSIS — R29898 Other symptoms and signs involving the musculoskeletal system: Secondary | ICD-10-CM

## 2024-06-07 DIAGNOSIS — M25641 Stiffness of right hand, not elsewhere classified: Secondary | ICD-10-CM

## 2024-06-07 DIAGNOSIS — M79644 Pain in right finger(s): Secondary | ICD-10-CM

## 2024-06-07 NOTE — Therapy (Signed)
 OUTPATIENT OCCUPATIONAL THERAPY ORTHO TREATMENT NOTE  Patient Name: Julie Miles MRN: [id number] DOB:Apr 20, 1951, 73 y.o., female Today's Date: 06/07/2024  PCP: Marvine Rush, MD REFERRING PROVIDER: Onesimo Anes, MD  END OF SESSION:  OT End of Session - 06/07/24 [phone]     Visit Number 8    Number of Visits 9    Date for OT Re-Evaluation 06/18/24    Authorization Type [employer]    Progress Note Due on Visit 10    OT Start Time 0850    OT Stop Time 0931    OT Time Calculation (min) 41 min    Activity Tolerance Patient tolerated treatment well    Behavior During Therapy [facility] for tasks assessed/performed          Past Medical History:  Diagnosis Date   Anxiety    History of blood transfusion 1975   at [facility] after vag delivery   History of kidney stones    Hypercholesterolemia    Hypothyroidism    MRSA (methicillin resistant Staphylococcus aureus) 03/08/2020   confirmed by PCR   PONV (postoperative nausea and vomiting)    Past Surgical History:  Procedure Laterality Date   ANTERIOR AND POSTERIOR REPAIR  06/24/2012   Procedure: ANTERIOR (CYSTOCELE) AND POSTERIOR REPAIR (RECTOCELE);  Surgeon: Rosaline DELENA Luna, MD;  Location: [facility];  Service: Gynecology;  Laterality: N/A;  anterior repair   APPENDECTOMY     BLADDER SUSPENSION  06/24/2012   Procedure: TRANSVAGINAL TAPE (TVT) PROCEDURE;  Surgeon: Rosaline DELENA Luna, MD;  Location: [facility];  Service: Gynecology;  Laterality: N/A;   CHOLECYSTECTOMY     CYSTOSCOPY  06/24/2012   Procedure: CYSTOSCOPY;  Surgeon: Rosaline DELENA Luna, MD;  Location: [facility];  Service: Gynecology;  Laterality: N/A;   FOOT SURGERY Right    KNEE ARTHROSCOPY W/ MENISCAL REPAIR Right    LAPAROSCOPY  06/24/2012   Procedure: LAPAROSCOPY OPERATIVE;  Surgeon: Rosaline DELENA Luna, MD;  [facility]: [facility];  Service: Gynecology;  Laterality: N/A;   TOTAL KNEE ARTHROPLASTY Right 03/13/2020   Procedure: RIGHT TOTAL KNEE ARTHROPLASTY;  Surgeon: Jerri Kay HERO, MD;  Location: [facility];  Service: Orthopedics;  Laterality: Right;   TUBAL LIGATION     VAGINAL HYSTERECTOMY  06/24/2012   Procedure: HYSTERECTOMY VAGINAL;  Surgeon: Rosaline DELENA Luna, MD;  Location: [facility];  Service: Gynecology;  Laterality: N/A;   Patient Active Problem List   Diagnosis Date Noted   Paroxysmal atrial fibrillation with RVR (HCC) 01/31/2024   Mixed hyperlipidemia 01/31/2024   Acquired hypothyroidism 01/31/2024   Family history of early CAD 01/24/2022   Dyspnea on exertion 01/24/2022   Exertional angina (HCC) 01/24/2022   Status post total right knee replacement 03/13/2020   Primary osteoarthritis of right knee 02/10/2020   S/P right knee arthroscopy 03/03/2019   Sciatica of left side 11/19/2018   Anxiety 07/20/2010   HEMATOCHEZIA 07/20/2010    ONSET DATE: 03/20/24  REFERRING DIAG:  D37.386I (ICD-10-CM) - Closed displaced fracture of proximal phalanx of left middle finger with routine healing, subsequent encounter  S62.627D (ICD-10-CM) - Closed displaced fracture of middle phalanx of left little finger with routine healing, subsequent encounter  S62.615D (ICD-10-CM) - Closed displaced fracture of proximal phalanx of left ring finger with routine healing, subsequent encounter    THERAPY DIAG:  Pain in right finger(s)  Finger stiffness, right  Other symptoms and signs involving the musculoskeletal system  Rationale for Evaluation and Treatment: Rehabilitation  SUBJECTIVE:   SUBJECTIVE STATEMENT: It  still feels the same Pt accompanied by: self  PERTINENT HISTORY: Julie Miles is a 73 y.o. female with the following: Proximal phalanx fractures of left long and ring fingers, Middle phalanx fracture of the left small finger. Pt was immobilized for 6 weeks. Recent x-rays indicate healing well with small noted displacement still visible.   PRECAUTIONS: None  WEIGHT BEARING RESTRICTIONS: No  PAIN:  Are you having pain? Yes: NPRS scale:  5/10 Pain location: L fingers Pain description: aching sharp Aggravating factors: movement and pressure Relieving factors: rest  FALLS: Has patient fallen in last 6 months? Yes. Number of falls 1  PLOF: Independent  PATIENT GOALS: To improve pain and function  NEXT MD VISIT: 06/09/24  OBJECTIVE:  Note: Objective measures were completed at Evaluation unless otherwise noted.  HAND DOMINANCE: Right  ADLs: Overall ADLs: Pt has difficulty with washing hair, holding towels to dry off after shower, difficulty with clasps, buttons, zippers. Poor grip/grasp on items during cooking and cleaning.  FUNCTIONAL OUTCOME MEASURES: Quick Dash: 50  UPPER EXTREMITY ROM:      Active ROM Left eval  Thumb MCP (0-60) 50  Thumb IP (0-80) 60  Thumb Opposition to Small Finger  able  Index MCP (0-90)  80  Index PIP (0-100)  75  Index DIP (0-70)  35  Long MCP (0-90)  70  Long PIP (0-100)  80  Long DIP (0-70)  30  Ring MCP (0-90) 65   Ring PIP (0-100)  75  Ring DIP (0-70)  20  Little MCP (0-90)  65  Little PIP (0-100)  25  Little DIP (0-70)  20  (Blank rows = not tested)  HAND FUNCTION: Grip strength: Right: 52 lbs; Left: 12 lbs, Lateral pinch: Right: 12 lbs, Left: 9 lbs, and 3 point pinch: Right: 15 lbs, Left: 5 lbs  COORDINATION: 9 Hole Peg test: Right: -- sec; Left: -- sec  SENSATION: WFL  EDEMA: mild swelling noted along digit 2 and 4.   OBSERVATIONS: Index finger is the most stiff and ring finger is the most painful   TREATMENT DATE:   06/07/24 -Manual therapy: myofascial release and edema massage applied to all fingers and palmar aspect of hand in order to reduce fascial restrictions and pain, as well as improve ROM. -Stretching: OT stretching each finger into flexion 5x15 -Digit ROM: composite flexion, opposition, extension, x10 -Grooved peg board, holding 2 pegs at a time -Theraputty: red, roll into ball, flatten into pancake, PVC pipe cutting crcles, roll into log,  tripod pinch, 4th finger pinch, roll into ball, squeeze -Cube Stacking: red resistance clip, stacking 4 towers of 5 cubes -In hand translation, holding 5 cubes and using fingers to push them into fingers and stack 4 stacks of 5 cubes  06/03/24 -Manual therapy: myofascial release and edema massage applied to all fingers and palmar aspect of hand in order to reduce fascial restrictions and pain, as well as improve ROM. -Stretching: OT stretching each finger into flexion 5x15 -Self stretching: interphalangeal stretch, extension stretch, and flexion stretch along MCP's.  -Pt provided and fitted for flexion glove -Sponges: 17, 21 -Towel crumple x2 -Coins: holding 10 coins and placing in coin bank, dropped 2-5 each time  06/01/24 -Manual therapy: myofascial release and edema massage applied to all fingers and palmar aspect of hand in order to reduce fascial restrictions and pain, as well as improve ROM. -Stretching: OT stretching each finger into flexion 5x15 -Self stretching: interphalangeal stretch, extension stretch, and flexion stretch along MCP's.  -  Pinch tree: red, green, blue resistance clips, tripod pinch up and lateral pinch down -grooved peg board -Gripper: 7#, 15# 8 medium beads each   PATIENT EDUCATION: Education details:Continue HEP Person educated: Patient Education method: Programmer, multimedia, Demonstration, and Handouts Education comprehension: verbalized understanding and returned demonstration  HOME EXERCISE PROGRAM: 8/14: Digit ROM and Towel Crumple 9/2: Finger stretches 9/4: Theraputty  GOALS: Goals reviewed with patient? Yes  SHORT TERM GOALS: Target date: 06/18/24  Pt will be provided and educated on comprehensive hep for L hand mobility and strength in order to independently complete ADL's.   Goal status: IN PROGRESS  2.  Pt will decrease pain in the LUE to 2/10 or less in order to sleep 3+ consecutive hours without waking due to pain.  Goal status: IN PROGRESS  3.   Pt will increase digit ROM by 10* in order to make a full fist for grasping items needed during grooming and dressing tasks.   Goal status: IN PROGRESS  4.  Pt will increase LUE grip by 20# and pinch by 2# in order to grasp and hold items during cooking and cleaning tasks without dropping them.  Goal status: IN PROGRESS  5.  Pt will improve LUE coordination by completing the 9 hole peg test in 30 or less, in order to manipulate buttons, zippers, and clasps independently.   Goal status: IN PROGRESS   ASSESSMENT:  CLINICAL IMPRESSION: Pt demonstrating improvements with ROM and pain. She is tolerating stretching into flexion with less pain. Pt is able to achieve approximately 80% of full ROM in her fingers. Her coordination and in hand translation are also improving well. OT providing hands on assist with stretching, as well as verbal and tactile cuing for positioning and technique throughout session.   PERFORMANCE DEFICITS: in functional skills including ADLs, IADLs, coordination, dexterity, sensation, edema, ROM, strength, pain, fascial restrictions, Fine motor control, body mechanics, and UE functional use.   PLAN:  OT FREQUENCY: 2x/week  OT DURATION: 4 weeks  PLANNED INTERVENTIONS: 97168 OT Re-evaluation, 97535 self care/ADL training, 02889 therapeutic exercise, 97530 therapeutic activity, 97112 neuromuscular re-education, 97140 manual therapy, 97035 ultrasound, 97018 paraffin, 02989 moist heat, 97032 electrical stimulation (manual), passive range of motion, functional mobility training, energy conservation, coping strategies training, patient/family education, and DME and/or AE instructions  RECOMMENDED OTHER SERVICES: N/A  CONSULTED AND AGREED WITH PLAN OF CARE: Patient  PLAN FOR NEXT SESSION: stretching, Digit ROM, Digit blocking, grip strengthening  Valentin Nightingale, OTR/L [facility] [phone] Valentin Jillyn Nightingale, OT 06/07/2024, 9:39 AM

## 2024-06-09 ENCOUNTER — Encounter: Payer: Self-pay | Admitting: Orthopedic Surgery

## 2024-06-09 ENCOUNTER — Ambulatory Visit (INDEPENDENT_AMBULATORY_CARE_PROVIDER_SITE_OTHER): Payer: Self-pay

## 2024-06-09 ENCOUNTER — Ambulatory Visit (HOSPITAL_COMMUNITY): Payer: Self-pay | Admitting: Occupational Therapy

## 2024-06-09 ENCOUNTER — Encounter (HOSPITAL_COMMUNITY): Payer: Self-pay | Admitting: Occupational Therapy

## 2024-06-09 ENCOUNTER — Ambulatory Visit: Admitting: Orthopedic Surgery

## 2024-06-09 DIAGNOSIS — M1812 Unilateral primary osteoarthritis of first carpometacarpal joint, left hand: Secondary | ICD-10-CM | POA: Diagnosis not present

## 2024-06-09 DIAGNOSIS — S62627D Displaced fracture of medial phalanx of left little finger, subsequent encounter for fracture with routine healing: Secondary | ICD-10-CM

## 2024-06-09 DIAGNOSIS — R29898 Other symptoms and signs involving the musculoskeletal system: Secondary | ICD-10-CM | POA: Diagnosis not present

## 2024-06-09 DIAGNOSIS — M25641 Stiffness of right hand, not elsewhere classified: Secondary | ICD-10-CM | POA: Diagnosis not present

## 2024-06-09 DIAGNOSIS — M79644 Pain in right finger(s): Secondary | ICD-10-CM | POA: Diagnosis not present

## 2024-06-09 DIAGNOSIS — S62615D Displaced fracture of proximal phalanx of left ring finger, subsequent encounter for fracture with routine healing: Secondary | ICD-10-CM | POA: Diagnosis not present

## 2024-06-09 DIAGNOSIS — S62613D Displaced fracture of proximal phalanx of left middle finger, subsequent encounter for fracture with routine healing: Secondary | ICD-10-CM

## 2024-06-09 NOTE — Progress Notes (Signed)
 Orthopaedic Clinic Return  Assessment: Julie Miles is a 73 y.o. female with the following: Proximal phalanx fractures of left long and ring fingers Middle phalanx fracture of the left small finger   Plan: Julie Miles has multiple fractures in the left hand.  Radiographs are stable.  She has been working with occupational therapy.  She has limited motion of her fingers, but is starting to use her hand more.  Pain is getting worse at the [facility] joint.  She is interested in an injection.  That was completed in clinic today.  I would like see her back in 1 month for follow-up regarding her fingers.   Procedure note injection - Left Thumb CMC joint   Verbal consent was obtained to inject the Left Thumb CMC joint Timeout was completed to confirm the site of injection.  The skin was prepped with alcohol and ethyl chloride was sprayed at the injection site.  A 21-gauge needle was used to inject 40 mg of Depo-Medrol  and 1% lidocaine  (1 cc) into the Left Thumb CMC joint using a direct anterior approach.  There were no complications. Patient tolerated the procedure well. A sterile bandage was applied  Follow-up: Return in about 4 weeks (around 07/07/2024).   Subjective:  Chief Complaint  Patient presents with   Fracture    L hand DOI 03/20/24    History of Present Illness: Julie Miles is a 73 y.o. female who returns to clinic for evaluation of left hand pain.  She injured her left hand approximately 3 months ago.  She has multiple fractures, multiple fingers.  She has been working with occupational therapy.  She notes some improvement, but still has limitations.  She is on the middle make a fist.  She is doing exercises at home.  Pain in the left thumb is gotten worse.  She is interested in an injection.  Review of Systems: No fevers or chills No numbness or tingling No chest pain No shortness of breath No bowel or bladder dysfunction No GI distress No  headaches   Objective: There were no vitals taken for this visit.  Physical Exam:  Left hand with some residual swelling.  Mild deformity is appreciated.  All fingers are stiff.  She is unable to make a fist.  Positive CMC grind test.  Mild extension at the base of the proximal phalanx to the long and ring finger.  She has stiffness of the MCP joints at all 4 fingers.  Sensation is intact throughout the left hand    IMAGING: I personally ordered and reviewed the following images:  X-rays of the left hand were obtained in clinic today.  These are compared to available x-rays.  Fractures remain stable.  Fractures of the base of the proximal phalanx of the long and ring fingers are in unchanged alignment.  Mild extension through the fractures.  Degenerative changes noted at the first [facility] joint.  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  Julie DELENA Horde, MD 06/09/2024 9:51 AM

## 2024-06-09 NOTE — Therapy (Signed)
 OUTPATIENT OCCUPATIONAL THERAPY ORTHO TREATMENT NOTE  Patient Name: Julie Miles MRN: [id number] DOB:1950-12-09, 73 y.o., female Today's Date: 06/09/2024  PCP: Marvine Rush, MD REFERRING PROVIDER: Onesimo Anes, MD  END OF SESSION:  OT End of Session - 06/09/24 1332     Visit Number 9    Number of Visits 17    Date for OT Re-Evaluation 07/09/24    Authorization Type [employer]    Progress Note Due on Visit 10    OT Start Time 1036    OT Stop Time 1106    OT Time Calculation (min) 30 min    Activity Tolerance Patient tolerated treatment well    Behavior During Therapy [facility] for tasks assessed/performed         ** Pt requested to end session early as she started feeling unwell.  Past Medical History:  Diagnosis Date   Anxiety    History of blood transfusion 1975   at [facility] after vag delivery   History of kidney stones    Hypercholesterolemia    Hypothyroidism    MRSA (methicillin resistant Staphylococcus aureus) 03/08/2020   confirmed by PCR   PONV (postoperative nausea and vomiting)    Past Surgical History:  Procedure Laterality Date   ANTERIOR AND POSTERIOR REPAIR  06/24/2012   Procedure: ANTERIOR (CYSTOCELE) AND POSTERIOR REPAIR (RECTOCELE);  Surgeon: Rosaline DELENA Luna, MD;  Location: [facility];  Service: Gynecology;  Laterality: N/A;  anterior repair   APPENDECTOMY     BLADDER SUSPENSION  06/24/2012   Procedure: TRANSVAGINAL TAPE (TVT) PROCEDURE;  Surgeon: Rosaline DELENA Luna, MD;  Location: [facility];  Service: Gynecology;  Laterality: N/A;   CHOLECYSTECTOMY     CYSTOSCOPY  06/24/2012   Procedure: CYSTOSCOPY;  Surgeon: Rosaline DELENA Luna, MD;  Location: [facility];  Service: Gynecology;  Laterality: N/A;   FOOT SURGERY Right    KNEE ARTHROSCOPY W/ MENISCAL REPAIR Right    LAPAROSCOPY  06/24/2012   Procedure: LAPAROSCOPY OPERATIVE;  Surgeon: Rosaline DELENA Luna, MD;  [facility]: [facility];  Service: Gynecology;  Laterality: N/A;   TOTAL KNEE ARTHROPLASTY  Right 03/13/2020   Procedure: RIGHT TOTAL KNEE ARTHROPLASTY;  Surgeon: Jerri Kay HERO, MD;  Location: [facility];  Service: Orthopedics;  Laterality: Right;   TUBAL LIGATION     VAGINAL HYSTERECTOMY  06/24/2012   Procedure: HYSTERECTOMY VAGINAL;  Surgeon: Rosaline DELENA Luna, MD;  Location: [facility];  Service: Gynecology;  Laterality: N/A;   Patient Active Problem List   Diagnosis Date Noted   Paroxysmal atrial fibrillation with RVR (HCC) 01/31/2024   Mixed hyperlipidemia 01/31/2024   Acquired hypothyroidism 01/31/2024   Family history of early CAD 01/24/2022   Dyspnea on exertion 01/24/2022   Exertional angina (HCC) 01/24/2022   Status post total right knee replacement 03/13/2020   Primary osteoarthritis of right knee 02/10/2020   S/P right knee arthroscopy 03/03/2019   Sciatica of left side 11/19/2018   Anxiety 07/20/2010   HEMATOCHEZIA 07/20/2010    ONSET DATE: 03/20/24  REFERRING DIAG:  D37.386I (ICD-10-CM) - Closed displaced fracture of proximal phalanx of left middle finger with routine healing, subsequent encounter  S62.627D (ICD-10-CM) - Closed displaced fracture of middle phalanx of left little finger with routine healing, subsequent encounter  S62.615D (ICD-10-CM) - Closed displaced fracture of proximal phalanx of left ring finger with routine healing, subsequent encounter    THERAPY DIAG:  Pain in right finger(s)  Finger stiffness, right  Other symptoms and signs involving the musculoskeletal system  Rationale  for Evaluation and Treatment: Rehabilitation  SUBJECTIVE:   SUBJECTIVE STATEMENT: No changes Pt accompanied by: self  PERTINENT HISTORY: Julie Miles is a 73 y.o. female with the following: Proximal phalanx fractures of left long and ring fingers, Middle phalanx fracture of the left small finger. Pt was immobilized for 6 weeks. Recent x-rays indicate healing well with small noted displacement still visible.   PRECAUTIONS: None  WEIGHT BEARING RESTRICTIONS:  No  PAIN:  Are you having pain? Yes: NPRS scale: 5/10 Pain location: L fingers Pain description: aching sharp Aggravating factors: movement and pressure Relieving factors: rest  FALLS: Has patient fallen in last 6 months? Yes. Number of falls 1  PLOF: Independent  PATIENT GOALS: To improve pain and function  NEXT MD VISIT: 06/09/24  OBJECTIVE:  Note: Objective measures were completed at Evaluation unless otherwise noted.  HAND DOMINANCE: Right  ADLs: Overall ADLs: Pt has difficulty with washing hair, holding towels to dry off after shower, difficulty with clasps, buttons, zippers. Poor grip/grasp on items during cooking and cleaning.  FUNCTIONAL OUTCOME MEASURES: Quick Dash: 50 06/09/24: 34.09  UPPER EXTREMITY ROM:      Active ROM Left eval Left 06/09/24  Thumb MCP (0-60) 50 60  Thumb IP (0-80) 60 65  Thumb Opposition to Small Finger  able able  Index MCP (0-90)  80 85  Index PIP (0-100)  75 85  Index DIP (0-70)  35 40  Long MCP (0-90)  70 65  Long PIP (0-100)  80 80  Long DIP (0-70)  30 35  Ring MCP (0-90) 65  60  Ring PIP (0-100)  75 75  Ring DIP (0-70)  20 25  Little MCP (0-90)  65 75  Little PIP (0-100)  25 45  Little DIP (0-70)  20 50  (Blank rows = not tested)  HAND FUNCTION: Grip strength: Right: 52 lbs; Left: 12 lbs, Lateral pinch: Right: 12 lbs, Left: 9 lbs, and 3 point pinch: Right: 15 lbs, Left: 5 lbs 06/09/24: Grip strength: Left: 20 lbs, Lateral pinch: Left: 9 lbs, and 3 point pinch: Right: 15 lbs, Left: 7 lbs  COORDINATION: 9 Hole Peg test: Right: 23.25 sec; Left: 24.36 sec  SENSATION: WFL  EDEMA: mild swelling noted along digit 2 and 4.   OBSERVATIONS: Index finger is the most stiff and ring finger is the most painful   TREATMENT DATE:   06/09/24 -Reassessment -9 hole peg test -Stretching: OT stretching each finger into flexion 5x15 -Digit ROM: composite flexion, opposition, extension, x10 -In hand translation: medium and small  beads - 6 at a time and placing them hole side up  06/07/24 -Manual therapy: myofascial release and edema massage applied to all fingers and palmar aspect of hand in order to reduce fascial restrictions and pain, as well as improve ROM. -Stretching: OT stretching each finger into flexion 5x15 -Digit ROM: composite flexion, opposition, extension, x10 -Grooved peg board, holding 2 pegs at a time -Theraputty: red, roll into ball, flatten into pancake, PVC pipe cutting crcles, roll into log, tripod pinch, 4th finger pinch, roll into ball, squeeze -Cube Stacking: red resistance clip, stacking 4 towers of 5 cubes -In hand translation, holding 5 cubes and using fingers to push them into fingers and stack 4 stacks of 5 cubes  06/03/24 -Manual therapy: myofascial release and edema massage applied to all fingers and palmar aspect of hand in order to reduce fascial restrictions and pain, as well as improve ROM. -Stretching: OT stretching each finger into flexion  5x15 -Self stretching: interphalangeal stretch, extension stretch, and flexion stretch along MCP's.  -Pt provided and fitted for flexion glove -Sponges: 17, 21 -Towel crumple x2 -Coins: holding 10 coins and placing in coin bank, dropped 2-5 each time   PATIENT EDUCATION: Education details:Continue HEP Person educated: Patient Education method: Programmer, multimedia, Demonstration, and Handouts Education comprehension: verbalized understanding and returned demonstration  HOME EXERCISE PROGRAM: 8/14: Digit ROM and Towel Crumple 9/2: Finger stretches 9/4: Theraputty  GOALS: Goals reviewed with patient? Yes  SHORT TERM GOALS: Target date: 06/18/24  Pt will be provided and educated on comprehensive hep for L hand mobility and strength in order to independently complete ADL's.   Goal status: IN PROGRESS  2.  Pt will decrease pain in the LUE to 2/10 or less in order to sleep 3+ consecutive hours without waking due to pain.  Goal status: IN  PROGRESS  3.  Pt will increase digit ROM by 10* in order to make a full fist for grasping items needed during grooming and dressing tasks.   Goal status: IN PROGRESS  4.  Pt will increase LUE grip by 20# and pinch by 2# in order to grasp and hold items during cooking and cleaning tasks without dropping them.  Goal status: IN PROGRESS  5.  Pt will improve LUE coordination by completing the 9 hole peg test in 30 or less, in order to manipulate buttons, zippers, and clasps independently.   Goal status: IN PROGRESS   ASSESSMENT:  CLINICAL IMPRESSION: Pt completed reassessment this session. She is making incremental progress with her mobility and strength. Her ability to make a fist continues to be limited, however her grip strength is improving. Pt also struggling with in hand translation tasks due to limited finger flexion. OT provided verbal and tactile cuing for positioning and technique throughout session.   Session ended early per patient request due to her feeling unwell.   PERFORMANCE DEFICITS: in functional skills including ADLs, IADLs, coordination, dexterity, sensation, edema, ROM, strength, pain, fascial restrictions, Fine motor control, body mechanics, and UE functional use.   PLAN:  OT FREQUENCY: 2x/week  OT DURATION: 4 weeks  PLANNED INTERVENTIONS: 97168 OT Re-evaluation, 97535 self care/ADL training, 02889 therapeutic exercise, 97530 therapeutic activity, 97112 neuromuscular re-education, 97140 manual therapy, 97035 ultrasound, 97018 paraffin, 02989 moist heat, 97032 electrical stimulation (manual), passive range of motion, functional mobility training, energy conservation, coping strategies training, patient/family education, and DME and/or AE instructions  RECOMMENDED OTHER SERVICES: N/A  CONSULTED AND AGREED WITH PLAN OF CARE: Patient  PLAN FOR NEXT SESSION: stretching, Digit ROM, Digit blocking, grip strengthening  Valentin Nightingale, OTR/L [facility] [phone] Darris Staiger Jillyn Nightingale, OT 06/09/2024, 1:33 PM

## 2024-06-13 ENCOUNTER — Ambulatory Visit
Admission: EM | Admit: 2024-06-13 | Discharge: 2024-06-13 | Disposition: A | Discharge status: Home / Self Care | Attending: Nurse Practitioner | Admitting: Nurse Practitioner

## 2024-06-13 ENCOUNTER — Telehealth: Payer: Self-pay | Admitting: Emergency Medicine

## 2024-06-13 DIAGNOSIS — U071 COVID-19: Secondary | ICD-10-CM | POA: Diagnosis not present

## 2024-06-13 LAB — POC COVID19/FLU A&B COMBO
Covid Antigen, POC: POSITIVE — AB
Influenza A Antigen, POC: NEGATIVE
Influenza B Antigen, POC: NEGATIVE

## 2024-06-13 MED ORDER — PAXLOVID (300/100) 20 X 150 MG & 10 X 100MG PO TBPK: 3.0000 | ORAL_TABLET | Freq: Two times a day (BID) | ORAL | 0 refills | Status: AC

## 2024-06-13 MED ORDER — PROMETHAZINE-DM 6.25-15 MG/5ML PO SYRP: 5.0000 mL | ORAL_SOLUTION | Freq: Four times a day (QID) | ORAL | 0 refills | Status: AC | PRN

## 2024-06-13 MED ORDER — FLUTICASONE PROPIONATE 50 MCG/ACT NA SUSP: 2.0000 | Freq: Every day | NASAL | 0 refills | Status: DC

## 2024-06-13 NOTE — Discharge Instructions (Signed)
 Your COVID test was positive. Take medication as prescribed.  As discussed, while you are taking Paxlovid , you will need to hold your rosuvastatin  (Crestor ) for 2 weeks. Increase fluids and allow for plenty of rest. You may take over-the-counter Tylenol  as needed for pain, fever, or general discomfort. Recommend use of normal saline nasal spray throughout the day for nasal congestion and runny nose. For your cough, recommend the use of a humidifier in your bedroom at nighttime during sleep and sleeping elevated on pillows while symptoms persist. If you develop a fever, you will need to remain home until you have been fever free for 24 hours with no medication. Go to the emergency department if you experience shortness of breath, difficulty breathing, or other concerns. Follow-up with your primary care physician if symptoms fail to improve. Follow-up as needed.

## 2024-06-13 NOTE — Telephone Encounter (Signed)
 [facility] called and wanted to make sure Etta Seeds, NP was aware patient was on Eliquis  and was prescribed paxlovid .  Patient called and was informed to watch for sign of bleeding as paxlovid  can increase the effectiveness of Eliquis .  Patient verbalized understanding

## 2024-06-13 NOTE — ED Triage Notes (Signed)
 Pt reports she has been having sinus pressure, nasal drainage, fatigue, cough, body aches, sore throat x 6 days

## 2024-06-13 NOTE — ED Provider Notes (Signed)
 [facility]    CSN: [id number] Arrival date & time: 06/13/24  1016      History   Chief Complaint No chief complaint on file.   HPI Julie Miles is a 73 y.o. female.   The history is provided by the patient.   Patient presents with a 5-day history of sinus pressure, nasal drainage, fatigue, cough, body aches, ear pain, and sore throat.  She denies fever, chills, headache, wheezing, difficulty breathing, chest pain, abdominal pain, nausea, vomiting, diarrhea, or rash.  Patient states that she has been taking an old prescription of an antibiotic that she had used previously.  She denies any obvious close sick contacts.  Past Medical History:  Diagnosis Date   Anxiety    History of blood transfusion 1975   at [facility] after vag delivery   History of kidney stones    Hypercholesterolemia    Hypothyroidism    MRSA (methicillin resistant Staphylococcus aureus) 03/08/2020   confirmed by PCR   PONV (postoperative nausea and vomiting)     Patient Active Problem List   Diagnosis Date Noted   Paroxysmal atrial fibrillation with RVR (HCC) 01/31/2024   Mixed hyperlipidemia 01/31/2024   Acquired hypothyroidism 01/31/2024   Family history of early CAD 01/24/2022   Dyspnea on exertion 01/24/2022   Exertional angina (HCC) 01/24/2022   Status post total right knee replacement 03/13/2020   Primary osteoarthritis of right knee 02/10/2020   S/P right knee arthroscopy 03/03/2019   Sciatica of left side 11/19/2018   Anxiety 07/20/2010   HEMATOCHEZIA 07/20/2010    Past Surgical History:  Procedure Laterality Date   ANTERIOR AND POSTERIOR REPAIR  06/24/2012   Procedure: ANTERIOR (CYSTOCELE) AND POSTERIOR REPAIR (RECTOCELE);  Surgeon: Rosaline DELENA Luna, MD;  Location: [facility];  Service: Gynecology;  Laterality: N/A;  anterior repair   APPENDECTOMY     BLADDER SUSPENSION  06/24/2012   Procedure: TRANSVAGINAL TAPE (TVT) PROCEDURE;  Surgeon: Rosaline DELENA Luna,  MD;  Location: [facility];  Service: Gynecology;  Laterality: N/A;   CHOLECYSTECTOMY     CYSTOSCOPY  06/24/2012   Procedure: CYSTOSCOPY;  Surgeon: Rosaline DELENA Luna, MD;  Location: [facility];  Service: Gynecology;  Laterality: N/A;   FOOT SURGERY Right    KNEE ARTHROSCOPY W/ MENISCAL REPAIR Right    LAPAROSCOPY  06/24/2012   Procedure: LAPAROSCOPY OPERATIVE;  Surgeon: Rosaline DELENA Luna, MD;  Location: [facility];  Service: Gynecology;  Laterality: N/A;   TOTAL KNEE ARTHROPLASTY Right 03/13/2020   Procedure: RIGHT TOTAL KNEE ARTHROPLASTY;  Surgeon: Jerri Kay HERO, MD;  Location: [facility];  Service: Orthopedics;  Laterality: Right;   TUBAL LIGATION     VAGINAL HYSTERECTOMY  06/24/2012   Procedure: HYSTERECTOMY VAGINAL;  Surgeon: Rosaline DELENA Luna, MD;  Location: [facility];  Service: Gynecology;  Laterality: N/A;    OB History     Gravida  2   Para  2   Term  2   Preterm      AB      Living         SAB      IAB      Ectopic      Multiple      Live Births               Home Medications    Prior to Admission medications   Medication Sig Start Date End Date Taking? Authorizing Provider  fluticasone  (FLONASE ) 50 MCG/ACT nasal spray Place 2 sprays  into both nostrils daily. 06/13/24  Yes Leath-Warren, Etta PARAS, NP  nirmatrelvir/ritonavir (PAXLOVID , 300/100,) 20 x 150 MG & 10 x 100MG  TBPK Take 3 tablets by mouth 2 (two) times daily for 5 days. Patient GFR is > 60. Take nirmatrelvir (150 mg) two tablets twice daily for 5 days and ritonavir (100 mg) one tablet twice daily for 5 days. 06/13/24 06/18/24 Yes Leath-Warren, Etta PARAS, NP  promethazine -dextromethorphan (PROMETHAZINE -DM) 6.25-15 MG/5ML syrup Take 5 mLs by mouth 4 (four) times daily as needed. 06/13/24  Yes Leath-Warren, Etta PARAS, NP  acetaminophen  (TYLENOL ) 500 MG tablet Take 500 mg by mouth every 6 (six) hours as needed for moderate pain (pain score 4-6), fever or headache.    [provider]  albuterol  (VENTOLIN  HFA) 108  (90 Base) MCG/ACT inhaler Inhale 2 puffs into the lungs every 4 (four) hours as needed. Patient taking differently: Inhale 2 puffs into the lungs every 4 (four) hours as needed for wheezing or shortness of breath. 09/14/23   Stuart Vernell Norris, PA-C  ALPRAZolam  (XANAX ) 1 MG tablet Take 1 mg by mouth at bedtime.     [provider]  apixaban  (ELIQUIS ) 5 MG TABS tablet Take 1 tablet (5 mg total) by mouth 2 (two) times daily. 03/25/24   Strader, Laymon HERO, PA-C  levothyroxine  (SYNTHROID , LEVOTHROID) 50 MCG tablet Take 50 mcg by mouth daily before breakfast.    [provider]  metoprolol  tartrate (LOPRESSOR ) 25 MG tablet Take 1 tablet (25 mg total) by mouth 2 (two) times daily. 03/25/24 05/24/24  Johnson Laymon HERO, PA-C  rosuvastatin  (CRESTOR ) 10 MG tablet Take 10 mg by mouth at bedtime. 12/12/23   [provider]  traMADol  (ULTRAM ) 50 MG tablet Take 1 tablet (50 mg total) by mouth every 6 (six) hours as needed. 03/29/24   Onesimo Oneil LABOR, MD    Family History Family History  Problem Relation Age of Onset   Heart disease Mother    Diabetes Mother     Social History Social History   Tobacco Use   Smoking status: Former    Current packs/day: 0.00    Types: Cigarettes    Quit date: 2006    Years since quitting: 19.7   Smokeless tobacco: Never   Tobacco comments:    STATES SHE DOES NOT SMOKE.  Vaping Use   Vaping status: Never Used  Substance Use Topics   Alcohol use: No   Drug use: No     Allergies   Codeine   Review of Systems Review of Systems Per HPI  Physical Exam Triage Vital Signs ED Triage Vitals  Encounter Vitals Group     BP 06/13/24 1024 (!) 128/57     Girls Systolic BP Percentile --      Girls Diastolic BP Percentile --      Boys Systolic BP Percentile --      Boys Diastolic BP Percentile --      Pulse Rate 06/13/24 1024 71     Resp 06/13/24 1024 18     Temp 06/13/24 1024 97.7 F (36.5 C)     Temp Source 06/13/24 1024 Oral      SpO2 06/13/24 1024 95 %     Weight --      Height --      Head Circumference --      Peak Flow --      Pain Score 06/13/24 1026 0     Pain Loc --      Pain Education --  Exclude from Growth Chart --    No data found.  Updated Vital Signs BP (!) 128/57 (BP Location: Right Arm)   Pulse 71   Temp 97.7 F (36.5 C) (Oral)   Resp 18   SpO2 95%   Visual Acuity Right Eye Distance:   Left Eye Distance:   Bilateral Distance:    Right Eye Near:   Left Eye Near:    Bilateral Near:     Physical Exam Vitals and nursing note reviewed.  Constitutional:      General: She is not in acute distress.    Appearance: Normal appearance.  HENT:     Head: Normocephalic.     Right Ear: Tympanic membrane, ear canal and external ear normal.     Left Ear: Tympanic membrane, ear canal and external ear normal.     Nose: Congestion present.     Mouth/Throat:     Lips: Pink.     Mouth: Mucous membranes are moist.     Pharynx: Posterior oropharyngeal erythema and postnasal drip present. No pharyngeal swelling, oropharyngeal exudate or uvula swelling.     Comments: Cobblestoning present to posterior oropharynx  Eyes:     Extraocular Movements: Extraocular movements intact.     Pupils: Pupils are equal, round, and reactive to light.  Cardiovascular:     Rate and Rhythm: Normal rate and regular rhythm.     Pulses: Normal pulses.     Heart sounds: Normal heart sounds.  Pulmonary:     Effort: Pulmonary effort is normal. No respiratory distress.     Breath sounds: Normal breath sounds. No stridor. No wheezing, rhonchi or rales.  Abdominal:     General: Bowel sounds are normal.     Palpations: Abdomen is soft.     Tenderness: There is no abdominal tenderness.  Musculoskeletal:     Cervical back: Normal range of motion.  Skin:    General: Skin is warm and dry.  Neurological:     General: No focal deficit present.     Mental Status: She is alert and oriented to person, place, and time.   Psychiatric:        Mood and Affect: Mood normal.        Behavior: Behavior normal.      UC Treatments / Results  Labs (all labs ordered are listed, but only abnormal results are displayed) Labs Reviewed  POC COVID19/FLU A&B COMBO - Abnormal; Notable for the following components:      Result Value   Covid Antigen, POC Positive (*)    All other components within normal limits    EKG   Radiology No results found.  Procedures Procedures (including critical care time)  Medications Ordered in UC Medications - No data to display  Initial Impression / Assessment and Plan / UC Course  I have reviewed the triage vital signs and the nursing notes.  Pertinent labs & imaging results that were available during my care of the patient were reviewed by me and considered in my medical decision making (see chart for details).  COVID test was positive.  Patient reports to this provider that symptoms started 5 days ago versus 6 that was initially provided in triage.  Patient is able to receive Paxlovid  as this is the last day, she has elected to do so.  Patient was advised she will need to hold her rosuvastatin  for 2 weeks.  Reviewed patient's lab work dated May 2025, GFR greater than 60 at that time.  Symptomatic  treatment provided with Promethazine  DM for the cough and fluticasone  50 mcg nasal spray for nasal congestion and runny nose.  Supportive care recommendations were provided and discussed with the patient to include fluids, rest, over-the-counter analgesics, and use of a humidifier during sleep.  Discussed indications with patient regarding follow-up.  Patient was also given strict ER follow-up precautions.  Patient was in agreement with this plan of care and verbalizes understanding.  All questions were answered.  Patient stable for discharge.   Final Clinical Impressions(s) / UC Diagnoses   Final diagnoses:  COVID     Discharge Instructions      Your COVID test was  positive. Take medication as prescribed.  As discussed, while you are taking Paxlovid , you will need to hold your rosuvastatin  (Crestor ) for 2 weeks. Increase fluids and allow for plenty of rest. You may take over-the-counter Tylenol  as needed for pain, fever, or general discomfort. Recommend use of normal saline nasal spray throughout the day for nasal congestion and runny nose. For your cough, recommend the use of a humidifier in your bedroom at nighttime during sleep and sleeping elevated on pillows while symptoms persist. If you develop a fever, you will need to remain home until you have been fever free for 24 hours with no medication. Go to the emergency department if you experience shortness of breath, difficulty breathing, or other concerns. Follow-up with your primary care physician if symptoms fail to improve. Follow-up as needed.     ED Prescriptions     Medication Sig Dispense Auth. Provider   promethazine -dextromethorphan (PROMETHAZINE -DM) 6.25-15 MG/5ML syrup Take 5 mLs by mouth 4 (four) times daily as needed. 118 mL Leath-Warren, Etta PARAS, NP   fluticasone  (FLONASE ) 50 MCG/ACT nasal spray Place 2 sprays into both nostrils daily. 16 g Leath-Warren, Etta PARAS, NP   nirmatrelvir/ritonavir (PAXLOVID , 300/100,) 20 x 150 MG & 10 x 100MG  TBPK Take 3 tablets by mouth 2 (two) times daily for 5 days. Patient GFR is > 60. Take nirmatrelvir (150 mg) two tablets twice daily for 5 days and ritonavir (100 mg) one tablet twice daily for 5 days. 30 tablet Leath-Warren, Etta PARAS, NP      PDMP not reviewed this encounter.   Gilmer Etta PARAS, NP 06/13/24 1102

## 2024-06-14 ENCOUNTER — Encounter (HOSPITAL_COMMUNITY): Payer: Self-pay | Admitting: Occupational Therapy

## 2024-06-16 ENCOUNTER — Encounter (HOSPITAL_COMMUNITY): Payer: Self-pay | Admitting: Occupational Therapy

## 2024-06-23 ENCOUNTER — Encounter (HOSPITAL_COMMUNITY): Payer: Self-pay | Admitting: Occupational Therapy

## 2024-06-23 ENCOUNTER — Ambulatory Visit (HOSPITAL_COMMUNITY): Admitting: Occupational Therapy

## 2024-06-23 DIAGNOSIS — M79644 Pain in right finger(s): Secondary | ICD-10-CM

## 2024-06-23 DIAGNOSIS — M25641 Stiffness of right hand, not elsewhere classified: Secondary | ICD-10-CM | POA: Diagnosis not present

## 2024-06-23 DIAGNOSIS — R29898 Other symptoms and signs involving the musculoskeletal system: Secondary | ICD-10-CM | POA: Diagnosis not present

## 2024-06-23 NOTE — Therapy (Signed)
 OUTPATIENT OCCUPATIONAL THERAPY ORTHO TREATMENT NOTE DISCHARGE NOTE  Patient Name: Julie Miles MRN: [id number] DOB:1951-09-12, 73 y.o., female Today's Date: 06/23/2024  PCP: Marvine Rush, MD REFERRING PROVIDER: Onesimo Anes, MD  OCCUPATIONAL THERAPY DISCHARGE SUMMARY  Visits from Start of Care: 10  Current functional level related to goals / functional outcomes: Pt's made fair Improvements with mobility, strength, and pain.    Remaining deficits: Her grip remains limited and unable to make a fist.    Education / Equipment: Pt provided comprehensive HEP.    Plan: Patient agrees to discharge as she feels she is not getting any better       END OF SESSION:  OT End of Session - 06/23/24 1514     Visit Number 10    Number of Visits 17    Date for Recertification  07/09/24    Authorization Type [employer]    Progress Note Due on Visit 10    OT Start Time 1350    OT Stop Time 1430    OT Time Calculation (min) 40 min    Activity Tolerance Patient tolerated treatment well    Behavior During Therapy WFL for tasks assessed/performed           Past Medical History:  Diagnosis Date   Anxiety    History of blood transfusion 1975   at [facility] after vag delivery   History of kidney stones    Hypercholesterolemia    Hypothyroidism    MRSA (methicillin resistant Staphylococcus aureus) 03/08/2020   confirmed by PCR   PONV (postoperative nausea and vomiting)    Past Surgical History:  Procedure Laterality Date   ANTERIOR AND POSTERIOR REPAIR  06/24/2012   Procedure: ANTERIOR (CYSTOCELE) AND POSTERIOR REPAIR (RECTOCELE);  Surgeon: Rosaline DELENA Luna, MD;  Location: [facility];  Service: Gynecology;  Laterality: N/A;  anterior repair   APPENDECTOMY     BLADDER SUSPENSION  06/24/2012   Procedure: TRANSVAGINAL TAPE (TVT) PROCEDURE;  Surgeon: Rosaline DELENA Luna, MD;  Location: [facility];  Service: Gynecology;  Laterality: N/A;   CHOLECYSTECTOMY     CYSTOSCOPY   06/24/2012   Procedure: CYSTOSCOPY;  Surgeon: Rosaline DELENA Luna, MD;  Location: [facility];  Service: Gynecology;  Laterality: N/A;   FOOT SURGERY Right    KNEE ARTHROSCOPY W/ MENISCAL REPAIR Right    LAPAROSCOPY  06/24/2012   Procedure: LAPAROSCOPY OPERATIVE;  Surgeon: Rosaline DELENA Luna, MD;  Location: [facility];  Service: Gynecology;  Laterality: N/A;   TOTAL KNEE ARTHROPLASTY Right 03/13/2020   Procedure: RIGHT TOTAL KNEE ARTHROPLASTY;  Surgeon: Jerri Kay HERO, MD;  Location: [facility];  Service: Orthopedics;  Laterality: Right;   TUBAL LIGATION     VAGINAL HYSTERECTOMY  06/24/2012   Procedure: HYSTERECTOMY VAGINAL;  Surgeon: Rosaline DELENA Luna, MD;  Location: [facility];  Service: Gynecology;  Laterality: N/A;   Patient Active Problem List   Diagnosis Date Noted   Paroxysmal atrial fibrillation with RVR (HCC) 01/31/2024   Mixed hyperlipidemia 01/31/2024   Acquired hypothyroidism 01/31/2024   Family history of early CAD 01/24/2022   Dyspnea on exertion 01/24/2022   Exertional angina 01/24/2022   Status post total right knee replacement 03/13/2020   Primary osteoarthritis of right knee 02/10/2020   S/P right knee arthroscopy 03/03/2019   Sciatica of left side 11/19/2018   Anxiety 07/20/2010   HEMATOCHEZIA 07/20/2010    ONSET DATE: 03/20/24  REFERRING DIAG:  D37.386I (ICD-10-CM) - Closed displaced fracture of proximal phalanx of left middle finger  with routine healing, subsequent encounter  S62.627D (ICD-10-CM) - Closed displaced fracture of middle phalanx of left little finger with routine healing, subsequent encounter  S62.615D (ICD-10-CM) - Closed displaced fracture of proximal phalanx of left ring finger with routine healing, subsequent encounter    THERAPY DIAG:  Pain in right finger(s)  Finger stiffness, right  Other symptoms and signs involving the musculoskeletal system  Rationale for Evaluation and Treatment: Rehabilitation  SUBJECTIVE:   SUBJECTIVE STATEMENT: I think it is  as good as it's going to get Pt accompanied by: self  PERTINENT HISTORY: Julie Miles is a 73 y.o. female with the following: Proximal phalanx fractures of left long and ring fingers, Middle phalanx fracture of the left small finger. Pt was immobilized for 6 weeks. Recent x-rays indicate healing well with small noted displacement still visible.   PRECAUTIONS: None  WEIGHT BEARING RESTRICTIONS: No  PAIN:  Are you having pain? Yes: NPRS scale: 5/10 Pain location: L fingers Pain description: aching sharp Aggravating factors: movement and pressure Relieving factors: rest  FALLS: Has patient fallen in last 6 months? Yes. Number of falls 1  PLOF: Independent  PATIENT GOALS: To improve pain and function  NEXT MD VISIT: 06/09/24  OBJECTIVE:  Note: Objective measures were completed at Evaluation unless otherwise noted.  HAND DOMINANCE: Right  ADLs: Overall ADLs: Pt has difficulty with washing hair, holding towels to dry off after shower, difficulty with clasps, buttons, zippers. Poor grip/grasp on items during cooking and cleaning.  FUNCTIONAL OUTCOME MEASURES: Quick Dash: 50 06/09/24: 34.09  UPPER EXTREMITY ROM:      Active ROM Left eval Left 06/09/24 Left 06/23/24  Thumb MCP (0-60) 50 60 60  Thumb IP (0-80) 60 65 65  Thumb Opposition to Small Finger  able able able  Index MCP (0-90)  80 85 90  Index PIP (0-100)  75 85 80  Index DIP (0-70)  35 40 50  Long MCP (0-90)  70 65 75  Long PIP (0-100)  80 80 85  Long DIP (0-70)  30 35 40  Ring MCP (0-90) 65  60 70  Ring PIP (0-100)  75 75 80  Ring DIP (0-70)  20 25 30   Little MCP (0-90)  65 75 85  Little PIP (0-100)  25 45 55  Little DIP (0-70)  20 50 60  (Blank rows = not tested)  HAND FUNCTION: Grip strength: Right: 52 lbs; Left: 12 lbs, Lateral pinch: Right: 12 lbs, Left: 9 lbs, and 3 point pinch: Right: 15 lbs, Left: 5 lbs 06/09/24: Grip strength: Left: 20 lbs, Lateral pinch: Left: 9 lbs, and 3 point pinch: Right:  15 lbs, Left: 7 lbs 06/23/24: Grip strength: Left: 25 lbs, Lateral pinch: Left: 9 lbs, and 3 point pinch: Left: 8 lbs  COORDINATION: 9 Hole Peg test: Right: 23.25 sec; Left: 24.36 sec  SENSATION: WFL  EDEMA: mild swelling noted along digit 2 and 4.   OBSERVATIONS: Index finger is the most stiff and ring finger is the most painful   TREATMENT DATE:   06/23/24 -Reassessment -9 hole peg test -Stretching: OT stretching each finger into flexion 5x15 -Digit ROM: composite flexion, opposition, extension, x10 -Towel crumple x2 -Manual therapy: myofascial release and edema massage applied to all fingers and palmar aspect of hand in order to reduce fascial restrictions and pain, as well as improve ROM.  06/09/24 -Reassessment -9 hole peg test -Stretching: OT stretching each finger into flexion 5x15 -Digit ROM: composite flexion, opposition, extension, x10 -In  hand translation: medium and small beads - 6 at a time and placing them hole side up  06/07/24 -Manual therapy: myofascial release and edema massage applied to all fingers and palmar aspect of hand in order to reduce fascial restrictions and pain, as well as improve ROM. -Stretching: OT stretching each finger into flexion 5x15 -Digit ROM: composite flexion, opposition, extension, x10 -Grooved peg board, holding 2 pegs at a time -Theraputty: red, roll into ball, flatten into pancake, PVC pipe cutting crcles, roll into log, tripod pinch, 4th finger pinch, roll into ball, squeeze -Cube Stacking: red resistance clip, stacking 4 towers of 5 cubes -In hand translation, holding 5 cubes and using fingers to push them into fingers and stack 4 stacks of 5 cubes   PATIENT EDUCATION: Education details:Continue HEP Person educated: Patient Education method: Programmer, multimedia, Facilities manager, and Handouts Education comprehension: verbalized understanding and returned demonstration  HOME EXERCISE PROGRAM: 8/14: Digit ROM and Towel Crumple 9/2:  Finger stretches 9/4: Theraputty  GOALS: Goals reviewed with patient? Yes  SHORT TERM GOALS: Target date: 06/18/24  Pt will be provided and educated on comprehensive hep for L hand mobility and strength in order to independently complete ADL's.   Goal status: MET  2.  Pt will decrease pain in the LUE to 2/10 or less in order to sleep 3+ consecutive hours without waking due to pain.  Goal status: MET  3.  Pt will increase digit ROM by 10* in order to make a full fist for grasping items needed during grooming and dressing tasks.   Goal status: PARTIALLY MET - unable to make full fist  4.  Pt will increase LUE grip by 20# and pinch by 2# in order to grasp and hold items during cooking and cleaning tasks without dropping them.  Goal status:  NOT MET  5.  Pt will improve LUE coordination by completing the 9 hole peg test in 30 or less, in order to manipulate buttons, zippers, and clasps independently.   Goal status: MET   ASSESSMENT:  CLINICAL IMPRESSION: Pt completed reassessment this session and was agreeable to discharge. She continues to make incremental progress, however ROM remains limited, which is and will limit her ROM as well. OT and pt reviewed HEP and pt has no further skilled OT needs at this time.   PERFORMANCE DEFICITS: in functional skills including ADLs, IADLs, coordination, dexterity, sensation, edema, ROM, strength, pain, fascial restrictions, Fine motor control, body mechanics, and UE functional use.   PLAN:  OT FREQUENCY: 2x/week  OT DURATION: 4 weeks  PLANNED INTERVENTIONS: 97168 OT Re-evaluation, 97535 self care/ADL training, 02889 therapeutic exercise, 97530 therapeutic activity, 97112 neuromuscular re-education, 97140 manual therapy, 97035 ultrasound, 97018 paraffin, 02989 moist heat, 97032 electrical stimulation (manual), passive range of motion, functional mobility training, energy conservation, coping strategies training, patient/family education, and  DME and/or AE instructions  RECOMMENDED OTHER SERVICES: N/A  CONSULTED AND AGREED WITH PLAN OF CARE: Patient  PLAN FOR NEXT SESSION: Discharge  Karlo Goeden Thelbert, OTR/L [facility] [phone] [place] Vanderloop Jillyn Thelbert, OT 06/23/2024, 3:18 PM

## 2024-06-25 ENCOUNTER — Encounter (HOSPITAL_COMMUNITY): Admitting: Occupational Therapy

## 2024-06-28 DIAGNOSIS — L570 Actinic keratosis: Secondary | ICD-10-CM | POA: Diagnosis not present

## 2024-07-05 ENCOUNTER — Other Ambulatory Visit (HOSPITAL_COMMUNITY): Payer: Self-pay | Admitting: Family Medicine

## 2024-07-05 DIAGNOSIS — Z1231 Encounter for screening mammogram for malignant neoplasm of breast: Secondary | ICD-10-CM

## 2024-07-07 ENCOUNTER — Encounter: Payer: Self-pay | Admitting: Orthopedic Surgery

## 2024-07-07 ENCOUNTER — Other Ambulatory Visit (INDEPENDENT_AMBULATORY_CARE_PROVIDER_SITE_OTHER): Payer: Self-pay

## 2024-07-07 ENCOUNTER — Ambulatory Visit: Admitting: Orthopedic Surgery

## 2024-07-07 DIAGNOSIS — S62613D Displaced fracture of proximal phalanx of left middle finger, subsequent encounter for fracture with routine healing: Secondary | ICD-10-CM

## 2024-07-07 DIAGNOSIS — S62627D Displaced fracture of medial phalanx of left little finger, subsequent encounter for fracture with routine healing: Secondary | ICD-10-CM

## 2024-07-07 DIAGNOSIS — S62615D Displaced fracture of proximal phalanx of left ring finger, subsequent encounter for fracture with routine healing: Secondary | ICD-10-CM | POA: Diagnosis not present

## 2024-07-07 NOTE — Progress Notes (Signed)
 Orthopaedic Clinic Return  Assessment: Julie Miles is a 73 y.o. female with the following: Proximal phalanx fractures of left long and ring fingers Middle phalanx fracture of the left small finger   Plan: Julie Miles has multiple fractures in the left hand.  Radiographs remain unchanged.  Her pain is much better.  Her function is improving.  She is lacking motion in her fingers, but this is not preventing her from completing her usual activities.  She is pleased with her overall improvements.  She is aware and did not expect to have full motion following the injuries.  I do think that she will continue to have improvement in function and motion, as long as she continues to use the left hand.  She states understanding.  She will follow-up as needed.  Follow-up: Return if symptoms worsen or fail to improve.   Subjective:  Chief Complaint  Patient presents with   Fracture    L hand multi fx DOI 03/20/24    History of Present Illness: Julie Miles is a 73 y.o. female who returns to clinic for evaluation of left hand pain.  She injured her left hand greater than 3 months ago.  She has multiple fractures, multiple fingers.  She is no longer working with occupational therapy.  She continues to try stretching of the fingers at home.  She is using her left hand more.  She is pleased with her improvements, and overall function.  She states that she can live with her current status.  She states the recent [facility] injection was very helpful.   Review of Systems: No fevers or chills No numbness or tingling No chest pain No shortness of breath No bowel or bladder dysfunction No GI distress No headaches   Objective: There were no vitals taken for this visit.  Physical Exam:  Left hand without swelling.  No bruising.  Mild deformities are appreciated to the long, ring and small fingers.  She is unable to make a full fist.  The index finger has also stiffened.  Fingers are warm and  well-perfused.  Sensation is intact in the left hand.    IMAGING: I personally ordered and reviewed the following images:  X-rays of the left hand were obtained in clinic today.  These were compared to prior x-rays.  Fractures at the base of the long and ring finger proximal phalanx are healed.  There is mild angulation.  No change in overall alignment.  Advanced degenerative changes at the first [facility] joint.    Impression: Stable left long, ring and small finger fractures without change in alignment.  Julie DELENA Horde, MD 07/07/2024 10:07 AM

## 2024-07-14 ENCOUNTER — Encounter (HOSPITAL_COMMUNITY): Payer: Self-pay

## 2024-07-14 ENCOUNTER — Ambulatory Visit (HOSPITAL_COMMUNITY)
Admission: RE | Admit: 2024-07-14 | Discharge: 2024-07-14 | Disposition: A | Discharge status: Home / Self Care | Source: Ambulatory Visit | Attending: Family Medicine | Admitting: Family Medicine

## 2024-07-14 DIAGNOSIS — Z1231 Encounter for screening mammogram for malignant neoplasm of breast: Secondary | ICD-10-CM | POA: Insufficient documentation

## 2024-10-06 NOTE — Progress Notes (Addendum)
 "  Cardiology Office Note    Date:  10/08/2024  ID:  Julie Miles, DOB 18-May-1951, MRN [id number] Cardiologist: Diannah SHAUNNA Maywood, MD { :  History of Present Illness:    Julie Miles is a 74 y.o. female with past medical history of CAD (prior Coronary CTA in 01/2022 showing mild to moderate nonobstructive disease), paroxysmal atrial fibrillation (diagnosed during admission in 01/2024), HLD and hypothyroidism who presents to the office today for 22-month follow-up.  She was last examined by myself in 02/2024 and reported overall doing well at that time and did report occasional palpitations prior to her hospitalization earlier that month but no recurrent symptoms. She was continued on Eliquis  5 mg twice daily for anticoagulation along with Lopressor  25 mg twice daily.  In talking with the patient today, she reports overall doing well since her last office visit. She reports only having 2 episodes of palpitations within the past several months which spontaneously resolved within a minute. She does have baseline dyspnea on exertion with no acute changes in this. Previously followed by Pulmonology but did not experience improvement with different trials of inhalers and has not followed up.  She denies any specific orthopnea, PND or pitting edema. No recent chest pain. Remains on Eliquis  for anticoagulation with no reports of melena, hematochezia or hematuria. She does have an annual physical scheduled with her PCP next month and is having labs at that time.  Studies Reviewed:   EKG: EKG is not ordered today.  Coronary CTA: 01/2022 IMPRESSION: 1. Mild to moderate non-obstructive CAD, CADRADS =3.   2. Coronary calcium  score of 540. This was 94th percentile for age and sex matched control.   3. Normal coronary origin with right dominance.   4. Aortic atherosclerosis. Mild aortic valve leaflet and annular calcification.   5. Aggressive cardiovascular risk factor modification  is recommended.    Echocardiogram: 01/2024 IMPRESSIONS     1. Left ventricular ejection fraction, by estimation, is 65 to 70%. The  left ventricle has normal function. The left ventricle has no regional  wall motion abnormalities. Left ventricular diastolic parameters are  indeterminate.   2. Right ventricular systolic function is normal. The right ventricular  size is normal. Tricuspid regurgitation signal is inadequate for assessing  PA pressure.   3. The mitral valve is normal in structure. No evidence of mitral valve  regurgitation. No evidence of mitral stenosis.   4. The aortic valve is tricuspid. There is mild calcification of the  aortic valve. There is mild thickening of the aortic valve. Aortic valve  regurgitation is not visualized. No aortic stenosis is present.   Risk Assessment/Calculations:    CHA2DS2-VASc Score = 3  This indicates a 3.2% annual risk of stroke. The patient's score is based upon: CHF History: 0 HTN History: 0 Diabetes History: 0 Stroke History: 0 Vascular Disease History: 1 Age Score: 1 Gender Score: 1    Physical Exam:   VS:  BP 116/68 (BP Location: Left Arm, Cuff Size: Large)   Pulse (!) 58   Ht 5' 5 (1.651 m)   Wt 218 lb 3.2 oz (99 kg)   SpO2 96%   BMI 36.31 kg/m    Wt Readings from Last 3 Encounters:  10/07/24 218 lb 3.2 oz (99 kg)  03/25/24 215 lb (97.5 kg)  03/20/24 208 lb (94.3 kg)     GEN: Well nourished, well developed female appearing in no acute distress NECK: No JVD; No carotid bruits CARDIAC: RRR, no murmurs,  rubs, gallops RESPIRATORY:  Clear to auscultation without rales, wheezing or rhonchi  ABDOMEN: Appears non-distended. No obvious abdominal masses. EXTREMITIES: No clubbing or cyanosis. No pitting edema.  Distal pedal pulses are 2+ bilaterally.   Assessment and Plan:   1. PAF (paroxysmal atrial fibrillation) (HCC) - She has a history of paroxysmal atrial fibrillation and reports only 2 episodes of  palpitations within the past several months which spontaneously resolved. She overall feels that symptoms have improved. For now, continue Lopressor  25 mg twice daily. - Continue Eliquis  5 mg twice daily for anticoagulation which is the appropriate dose given her current age (74 yo), weight (218 lbs) and renal function (creatinine at 0.72 when checked in 01/2024). She is due for a repeat CBC and BMET and is scheduled for follow-up labs next month.   2. Coronary artery disease involving native coronary artery of native heart without angina pectoris - Prior Coronary CTA in 01/2022 showed mild to moderate nonobstructive disease. She denies any recent anginal symptoms. She is not on ASA given the need for anticoagulation. Continue Lopressor  25 mg twice daily and Crestor  10 mg daily.  3. Hyperlipidemia LDL goal <70 - By review of [facility] DXA, LDL was at 72 when checked in 11/2023. She is scheduled for repeat labs next month with her PCP. Continue Crestor  10 mg daily for now.  4. Pulmonary nodule - CT Chest in 05/2022 showed pulmonary nodules with follow-up imaging recommended in 12 months and if stable at that time, no further testing recommended.  She has not had repeat imaging since 2023 and we will order a follow-up Chest CT.  Signed, Laymon CHRISTELLA Qua, PA-C   "

## 2024-10-07 ENCOUNTER — Ambulatory Visit: Attending: Student | Admitting: Student

## 2024-10-07 ENCOUNTER — Encounter: Payer: Self-pay | Admitting: Student

## 2024-10-07 VITALS — BP 116/68 | HR 58 | Ht 65.0 in | Wt 218.2 lb

## 2024-10-07 DIAGNOSIS — I480 Paroxysmal atrial fibrillation: Secondary | ICD-10-CM | POA: Diagnosis not present

## 2024-10-07 DIAGNOSIS — E785 Hyperlipidemia, unspecified: Secondary | ICD-10-CM

## 2024-10-07 DIAGNOSIS — R911 Solitary pulmonary nodule: Secondary | ICD-10-CM

## 2024-10-07 DIAGNOSIS — I2510 Atherosclerotic heart disease of native coronary artery without angina pectoris: Secondary | ICD-10-CM | POA: Diagnosis not present

## 2024-10-07 MED ORDER — METOPROLOL TARTRATE 25 MG PO TABS: 25.0000 mg | ORAL_TABLET | Freq: Two times a day (BID) | ORAL | 11 refills | Status: AC

## 2024-10-07 NOTE — Patient Instructions (Signed)
 Medication Instructions:   Continue current medications.   *If you need a refill on your cardiac medications before your next appointment, please call your pharmacy*   Testing/Procedures:  Chest CT - to monitor pulmonary nodules  Follow-Up: At [facility], you and your health needs are our priority.  As part of our continuing mission to provide you with exceptional heart care, our providers are all part of one team.  This team includes your primary Cardiologist (physician) and Advanced Practice Providers or APPs (Physician Assistants and Nurse Practitioners) who all work together to provide you with the care you need, when you need it.  Your next appointment:   1 year(s)  Provider:   You may see Vishnu P Mallipeddi, MD or one of the following Advanced Practice Providers on your designated Care Team:   Satoya Feeley, PA-C  [place], [place] Olivia [place], [place]     We recommend signing up for the patient portal called MyChart.  Sign up information is provided on this After Visit Summary.  MyChart is used to connect with patients for Virtual Visits (Telemedicine).  Patients are able to view lab/test results, encounter notes, upcoming appointments, etc.  Non-urgent messages can be sent to your provider as well.   To learn more about what you can do with MyChart, go to [email or web address].

## 2024-10-08 ENCOUNTER — Ambulatory Visit: Admitting: Orthopedic Surgery

## 2024-10-08 ENCOUNTER — Encounter: Payer: Self-pay | Admitting: Orthopedic Surgery

## 2024-10-08 ENCOUNTER — Encounter: Payer: Self-pay | Admitting: Student

## 2024-10-08 DIAGNOSIS — M1812 Unilateral primary osteoarthritis of first carpometacarpal joint, left hand: Secondary | ICD-10-CM

## 2024-10-08 NOTE — Patient Instructions (Signed)

## 2024-10-08 NOTE — Progress Notes (Signed)
 Orthopaedic Clinic Return  Assessment: ROREY HODGES is a 74 y.o. 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 Vanderslice has known left first [facility] arthritis.  She has had multiple injections in the first [facility] joint.  These continue to be effective.  She is interested in another injection today.   Procedure note injection - Left Thumb CMC joint   Verbal consent was obtained to inject the Left Thumb CMC joint Timeout was completed to confirm the site of injection.  The skin was prepped with alcohol and ethyl chloride was sprayed at the injection site.  A 21-gauge needle was used to inject 40 mg of Depo-Medrol  and 1% lidocaine  (1 cc) into the Left Thumb CMC joint using a direct anterior approach.  There were no complications. Patient tolerated the procedure well. A sterile bandage was applied  Follow-up: Return if symptoms worsen or fail to improve.   Subjective:  Chief Complaint  Patient presents with   Injections    L thumb    History of Present Illness: CHARLOTTA LAPAGLIA is a 74 y.o. female who returns to clinic for evaluation of left thumb pain.  She has known left first CMC arthritis.  She has done well with injections.  Review of Systems: No fevers or chills No numbness or tingling No chest pain No shortness of breath No bowel or bladder dysfunction No GI distress No headaches   Objective: There were no vitals taken for this visit.  Physical Exam:  Left hand without obvious deformity.  Tenderness to palpation over the first [facility].  Positive grind.  Residual deformities to her fingers.  She is unable to make full fist.   IMAGING: I personally ordered and reviewed the following images:  No new imaging obtained today.  [name], MD 10/08/2024 9:39 AM

## 2024-10-10 ENCOUNTER — Ambulatory Visit
Admission: EM | Admit: 2024-10-10 | Discharge: 2024-10-10 | Disposition: A | Discharge status: Home / Self Care | Attending: Family Medicine | Admitting: Family Medicine

## 2024-10-10 DIAGNOSIS — J069 Acute upper respiratory infection, unspecified: Secondary | ICD-10-CM | POA: Diagnosis not present

## 2024-10-10 HISTORY — DX: Unspecified atrial fibrillation: I48.91

## 2024-10-10 LAB — POCT INFLUENZA A/B
Influenza A, POC: NEGATIVE
Influenza B, POC: NEGATIVE

## 2024-10-10 NOTE — ED Provider Notes (Signed)
 " [facility]    CSN: [id number] Arrival date & time: 10/10/24  9047      History   Chief Complaint No chief complaint on file.   HPI Julie Miles is a 74 y.o. female.   Patient presenting today with 2-day history of cough, diarrhea, body aches, rhinorrhea.  Denie wheezing, chest pain, shortness of breath, vomiting, rashes.  So far trying some leftover Promethazine  DM cough syrup with mild temporary benefit.  Otherwise not try anything over-the-counter for symptoms.  No known history of chronic pulmonary disease.    Past Medical History:  Diagnosis Date   A-fib [facility])    Anxiety    History of blood transfusion 1975   at [facility] after vag delivery   History of kidney stones    Hypercholesterolemia    Hypothyroidism    MRSA (methicillin resistant Staphylococcus aureus) 03/08/2020   confirmed by PCR   PONV (postoperative nausea and vomiting)     Patient Active Problem List   Diagnosis Date Noted   Paroxysmal atrial fibrillation with RVR (HCC) 01/31/2024   Mixed hyperlipidemia 01/31/2024   Acquired hypothyroidism 01/31/2024   Family history of early CAD 01/24/2022   Dyspnea on exertion 01/24/2022   Exertional angina 01/24/2022   Status post total right knee replacement 03/13/2020   Primary osteoarthritis of right knee 02/10/2020   S/P right knee arthroscopy 03/03/2019   Sciatica of left side 11/19/2018   Anxiety 07/20/2010   HEMATOCHEZIA 07/20/2010    Past Surgical History:  Procedure Laterality Date   ANTERIOR AND POSTERIOR REPAIR  06/24/2012   Procedure: ANTERIOR (CYSTOCELE) AND POSTERIOR REPAIR (RECTOCELE);  Surgeon: Rosaline DELENA Luna, MD;  Location: [facility];  Service: Gynecology;  Laterality: N/A;  anterior repair   APPENDECTOMY     BLADDER SUSPENSION  06/24/2012   Procedure: TRANSVAGINAL TAPE (TVT) PROCEDURE;  Surgeon: Rosaline DELENA Luna, MD;  Location: [facility];  Service: Gynecology;  Laterality: N/A;   CHOLECYSTECTOMY     CYSTOSCOPY   06/24/2012   Procedure: CYSTOSCOPY;  Surgeon: Rosaline DELENA Luna, MD;  Location: [facility];  Service: Gynecology;  Laterality: N/A;   FOOT SURGERY Right    KNEE ARTHROSCOPY W/ MENISCAL REPAIR Right    LAPAROSCOPY  06/24/2012   Procedure: LAPAROSCOPY OPERATIVE;  Surgeon: Rosaline DELENA Luna, MD;  Location: [facility];  Service: Gynecology;  Laterality: N/A;   TOTAL KNEE ARTHROPLASTY Right 03/13/2020   Procedure: RIGHT TOTAL KNEE ARTHROPLASTY;  Surgeon: Jerri Kay HERO, MD;  Location: [facility];  Service: Orthopedics;  Laterality: Right;   TUBAL LIGATION     VAGINAL HYSTERECTOMY  06/24/2012   Procedure: HYSTERECTOMY VAGINAL;  Surgeon: Rosaline DELENA Luna, MD;  Location: [facility];  Service: Gynecology;  Laterality: N/A;    OB History     Gravida  2   Para  2   Term  2   Preterm      AB      Living         SAB      IAB      Ectopic      Multiple      Live Births               Home Medications    Prior to Admission medications  Medication Sig Start Date End Date Taking? Authorizing Provider  acetaminophen  (TYLENOL ) 500 MG tablet Take 500 mg by mouth every 6 (six) hours as needed for moderate pain (pain score 4-6), fever or headache.  [provider]  albuterol  (VENTOLIN  HFA) 108 (90 Base) MCG/ACT inhaler Inhale 2 puffs into the lungs every 4 (four) hours as needed. Patient taking differently: Inhale 2 puffs into the lungs every 4 (four) hours as needed for wheezing or shortness of breath. 09/14/23   Stuart Vernell Norris, PA-C  ALPRAZolam  (XANAX ) 1 MG tablet Take 1 mg by mouth at bedtime.     [provider]  apixaban  (ELIQUIS ) 5 MG TABS tablet Take 1 tablet (5 mg total) by mouth 2 (two) times daily. 03/25/24   Strader, Laymon HERO, PA-C  levothyroxine  (SYNTHROID , LEVOTHROID) 50 MCG tablet Take 50 mcg by mouth daily before breakfast.    [provider]  metoprolol  tartrate (LOPRESSOR ) 25 MG tablet Take 1 tablet (25 mg total) by mouth 2 (two) times daily.  10/07/24 12/06/24  Johnson Laymon HERO, PA-C  promethazine -dextromethorphan (PROMETHAZINE -DM) 6.25-15 MG/5ML syrup Take 5 mLs by mouth 4 (four) times daily as needed. 06/13/24   Leath-Warren, Etta PARAS, NP  rosuvastatin  (CRESTOR ) 10 MG tablet Take 10 mg by mouth at bedtime. 12/12/23   [provider]    Family History Family History  Problem Relation Age of Onset   Heart disease Mother    Diabetes Mother     Social History Social History[1]   Allergies   Codeine   Review of Systems Review of Systems PER HPI  Physical Exam Triage Vital Signs ED Triage Vitals  Encounter Vitals Group     BP 10/10/24 1001 130/68     Girls Systolic BP Percentile --      Girls Diastolic BP Percentile --      Boys Systolic BP Percentile --      Boys Diastolic BP Percentile --      Pulse Rate 10/10/24 1001 69     Resp 10/10/24 1001 20     Temp 10/10/24 1001 98.1 F (36.7 C)     Temp Source 10/10/24 1001 Oral     SpO2 10/10/24 1001 94 %     Weight --      Height --      Head Circumference --      Peak Flow --      Pain Score 10/10/24 1000 10     Pain Loc --      Pain Education --      Exclude from Growth Chart --    No data found.  Updated Vital Signs BP 130/68 (BP Location: Right Arm)   Pulse 69   Temp 98.1 F (36.7 C) (Oral)   Resp 20   SpO2 94%   Visual Acuity Right Eye Distance:   Left Eye Distance:   Bilateral Distance:    Right Eye Near:   Left Eye Near:    Bilateral Near:     Physical Exam Vitals and nursing note reviewed.  Constitutional:      Appearance: Normal appearance.  HENT:     Head: Atraumatic.     Right Ear: Tympanic membrane and external ear normal.     Left Ear: Tympanic membrane and external ear normal.     Nose: Rhinorrhea present.     Mouth/Throat:     Mouth: Mucous membranes are moist.     Pharynx: No posterior oropharyngeal erythema.  Eyes:     Extraocular Movements: Extraocular movements intact.     Conjunctiva/sclera: Conjunctivae  normal.  Cardiovascular:     Rate and Rhythm: Normal rate and regular rhythm.     Heart sounds: Normal heart sounds.  Pulmonary:     Effort: Pulmonary effort is normal.     Breath sounds: Normal breath sounds. No wheezing or rales.  Musculoskeletal:        General: Normal range of motion.     Cervical back: Normal range of motion and neck supple.  Skin:    General: Skin is warm and dry.  Neurological:     Mental Status: She is alert and oriented to person, place, and time.  Psychiatric:        Mood and Affect: Mood normal.        Thought Content: Thought content normal.      UC Treatments / Results  Labs (all labs ordered are listed, but only abnormal results are displayed) Labs Reviewed  POCT INFLUENZA A/B    EKG   Radiology No results found.  Procedures Procedures (including critical care time)  Medications Ordered in UC Medications - No data to display  Initial Impression / Assessment and Plan / UC Course  I have reviewed the triage vital signs and the nursing notes.  Pertinent labs & imaging results that were available during my care of the patient were reviewed by me and considered in my medical decision making (see chart for details).     Vitals and exam reassuring today, rapid flu negative, suspect viral respiratory infection.  She states she has plenty of Phenergan  DM cough syrup at home, continue use of this, Coricidin HBP, plain Mucinex, supportive over-the-counter medications and home care.  Return for worsening or unresolving symptoms.  Final Clinical Impressions(s) / UC Diagnoses   Final diagnoses:  Viral URI with cough     Discharge Instructions      Your flu test was negative.  I suspect another strain of virus to be causing your symptoms.  Continue the cough syrup, take Coricidin HBP, plain Mucinex, use saline sinus rinses and humidifiers.  Follow-up for worsening or unresolving symptoms    ED Prescriptions   None    PDMP not reviewed  this encounter.    [1]  Social History Tobacco Use   Smoking status: Former    Current packs/day: 0.00    Types: Cigarettes    Quit date: 2006    Years since quitting: 20.0   Smokeless tobacco: Never   Tobacco comments:    STATES SHE DOES NOT SMOKE.  Vaping Use   Vaping status: Never Used  Substance Use Topics   Alcohol use: No   Drug use: No     Stuart Vernell Norris, PA-C 10/10/24 1128  "

## 2024-10-10 NOTE — ED Triage Notes (Signed)
 Pt reports she has a cough, diarrhea, and body aches x 2 days    Took cough syrup

## 2024-10-10 NOTE — Discharge Instructions (Signed)
 Your flu test was negative.  I suspect another strain of virus to be causing your symptoms.  Continue the cough syrup, take Coricidin HBP, plain Mucinex, use saline sinus rinses and humidifiers.  Follow-up for worsening or unresolving symptoms

## 2024-10-14 ENCOUNTER — Encounter (HOSPITAL_COMMUNITY): Payer: Self-pay

## 2024-10-14 ENCOUNTER — Ambulatory Visit (HOSPITAL_COMMUNITY)
Admission: RE | Admit: 2024-10-14 | Discharge: 2024-10-14 | Disposition: A | Discharge status: Home / Self Care | Source: Ambulatory Visit | Attending: Student | Admitting: Student

## 2024-10-14 DIAGNOSIS — R911 Solitary pulmonary nodule: Secondary | ICD-10-CM

## 2025-01-07 ENCOUNTER — Ambulatory Visit: Admitting: Orthopedic Surgery
